# Patient Record
Sex: Female | Born: 1970
Health system: Southern US, Community
[De-identification: ages and names within clinical notes are randomized; demographics above are authoritative.]

## PROBLEM LIST (undated history)

## (undated) DIAGNOSIS — I10 Essential (primary) hypertension: Secondary | ICD-10-CM

## (undated) DIAGNOSIS — J45909 Unspecified asthma, uncomplicated: Secondary | ICD-10-CM

## (undated) DIAGNOSIS — L509 Urticaria, unspecified: Secondary | ICD-10-CM

## (undated) HISTORY — DX: Urticaria, unspecified: L50.9

## (undated) HISTORY — DX: Unspecified asthma, uncomplicated: J45.909

---

## 2002-02-01 ENCOUNTER — Other Ambulatory Visit: Admission: RE | Admit: 2002-02-01 | Discharge: 2002-02-01 | Payer: Self-pay | Admitting: Obstetrics and Gynecology

## 2002-02-17 ENCOUNTER — Ambulatory Visit (HOSPITAL_COMMUNITY): Admission: RE | Admit: 2002-02-17 | Discharge: 2002-02-17 | Payer: Self-pay | Admitting: Obstetrics and Gynecology

## 2006-06-19 ENCOUNTER — Emergency Department (HOSPITAL_COMMUNITY): Admission: EM | Admit: 2006-06-19 | Discharge: 2006-06-19 | Payer: Self-pay | Admitting: Family Medicine

## 2006-09-10 ENCOUNTER — Emergency Department (HOSPITAL_COMMUNITY): Admission: EM | Admit: 2006-09-10 | Discharge: 2006-09-10 | Payer: Self-pay | Admitting: Family Medicine

## 2007-05-09 ENCOUNTER — Ambulatory Visit: Payer: Self-pay | Admitting: Cardiology

## 2007-05-09 ENCOUNTER — Inpatient Hospital Stay (HOSPITAL_COMMUNITY): Admission: AD | Admit: 2007-05-09 | Discharge: 2007-05-18 | Payer: Self-pay | Admitting: *Deleted

## 2007-05-13 ENCOUNTER — Encounter (INDEPENDENT_AMBULATORY_CARE_PROVIDER_SITE_OTHER): Payer: Self-pay | Admitting: Obstetrics

## 2007-05-15 ENCOUNTER — Encounter: Payer: Self-pay | Admitting: General Surgery

## 2007-05-16 ENCOUNTER — Encounter (INDEPENDENT_AMBULATORY_CARE_PROVIDER_SITE_OTHER): Payer: Self-pay | Admitting: Obstetrics and Gynecology

## 2007-05-19 ENCOUNTER — Encounter (HOSPITAL_COMMUNITY): Admission: RE | Admit: 2007-05-19 | Discharge: 2007-06-18 | Payer: Self-pay | Admitting: Obstetrics and Gynecology

## 2007-06-19 ENCOUNTER — Encounter: Admission: RE | Admit: 2007-06-19 | Discharge: 2007-07-13 | Payer: Self-pay | Admitting: Obstetrics and Gynecology

## 2007-12-23 ENCOUNTER — Ambulatory Visit (HOSPITAL_COMMUNITY): Admission: RE | Admit: 2007-12-23 | Discharge: 2007-12-23 | Payer: Self-pay | Admitting: Obstetrics

## 2008-01-26 ENCOUNTER — Ambulatory Visit (HOSPITAL_COMMUNITY): Admission: RE | Admit: 2008-01-26 | Discharge: 2008-01-26 | Payer: Self-pay | Admitting: Obstetrics

## 2008-02-16 ENCOUNTER — Ambulatory Visit (HOSPITAL_COMMUNITY): Admission: RE | Admit: 2008-02-16 | Discharge: 2008-02-16 | Payer: Self-pay | Admitting: Obstetrics

## 2008-03-09 ENCOUNTER — Ambulatory Visit (HOSPITAL_COMMUNITY): Admission: RE | Admit: 2008-03-09 | Discharge: 2008-03-09 | Payer: Self-pay | Admitting: Obstetrics

## 2008-04-08 ENCOUNTER — Ambulatory Visit (HOSPITAL_COMMUNITY): Admission: RE | Admit: 2008-04-08 | Discharge: 2008-04-08 | Payer: Self-pay | Admitting: Obstetrics

## 2008-05-17 ENCOUNTER — Encounter: Admission: RE | Admit: 2008-05-17 | Discharge: 2008-05-17 | Payer: Self-pay | Admitting: Obstetrics

## 2008-06-09 ENCOUNTER — Inpatient Hospital Stay (HOSPITAL_COMMUNITY): Admission: AD | Admit: 2008-06-09 | Discharge: 2008-06-10 | Payer: Self-pay | Admitting: Obstetrics

## 2008-07-20 ENCOUNTER — Inpatient Hospital Stay (HOSPITAL_COMMUNITY): Admission: AD | Admit: 2008-07-20 | Discharge: 2008-07-23 | Payer: Self-pay | Admitting: Obstetrics & Gynecology

## 2008-07-24 ENCOUNTER — Encounter: Admission: RE | Admit: 2008-07-24 | Discharge: 2008-08-04 | Payer: Self-pay | Admitting: Obstetrics

## 2008-09-10 ENCOUNTER — Emergency Department (HOSPITAL_COMMUNITY): Admission: EM | Admit: 2008-09-10 | Discharge: 2008-09-10 | Payer: Self-pay | Admitting: Emergency Medicine

## 2008-11-30 IMAGING — US US OB FOLLOW-UP
1 series · 14 of 28 positions shown · non-contrast
Comparison: none

OBSTETRICAL ULTRASOUND:
 This ultrasound was performed in The [HOSPITAL], and the [facility] report will be stored to [REDACTED] PACS.

[Series 1: us ob follow-up · 14 of 68 slices shown]
[im 3/68]
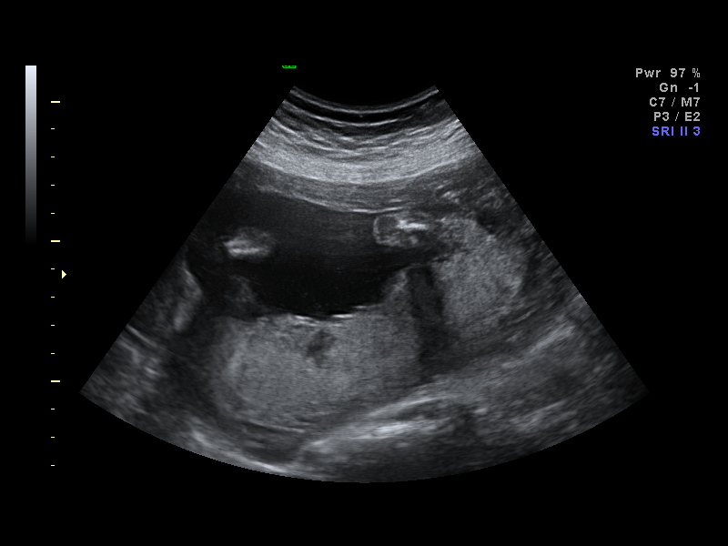
[im 8/68]
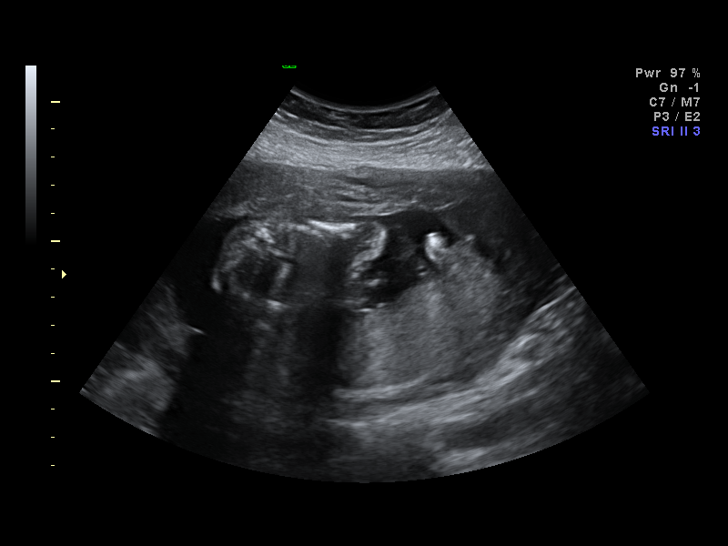
[im 13/68]
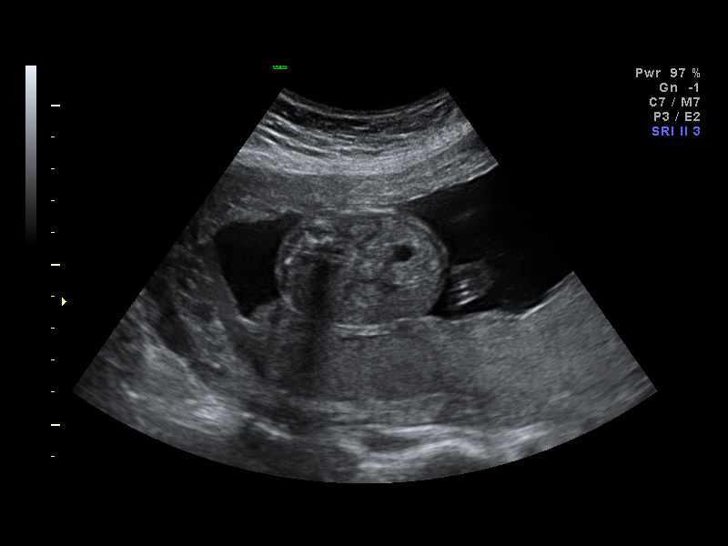
[im 18/68]
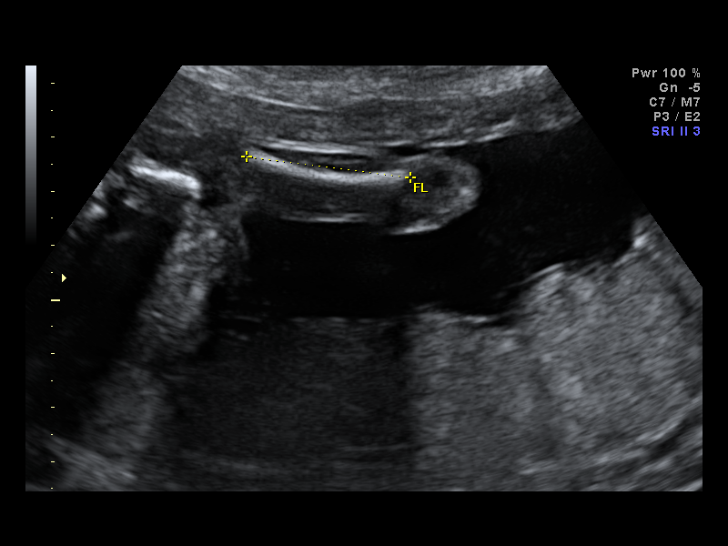
[im 23/68]
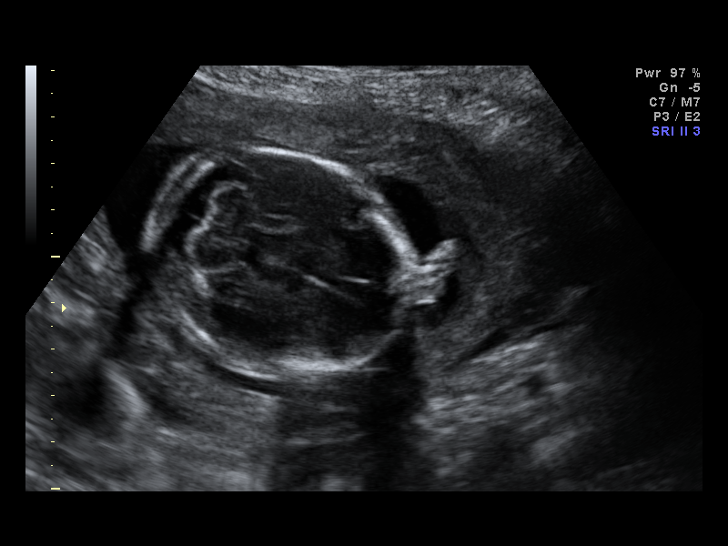
[im 28/68]
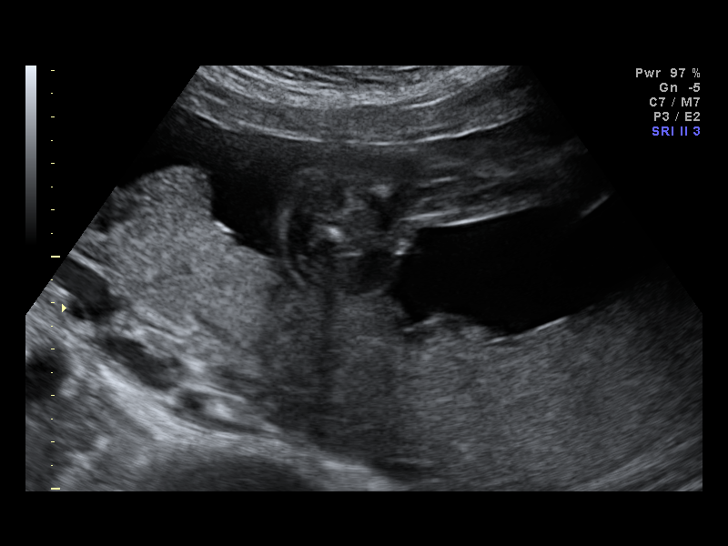
[im 33/68]
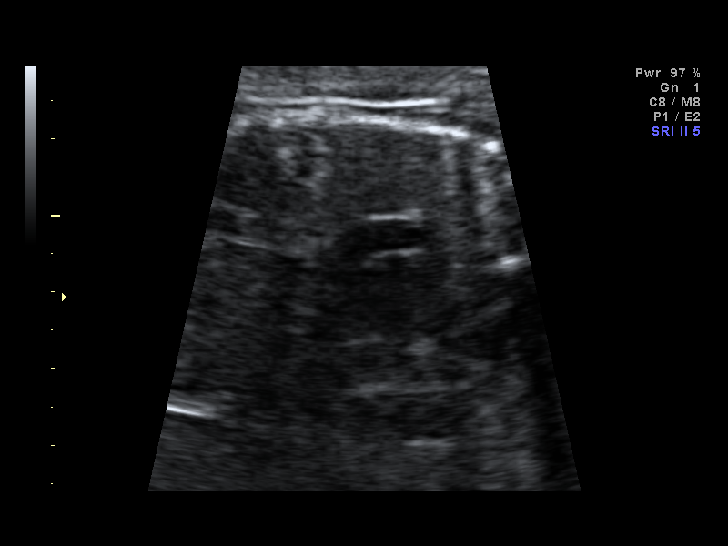
[im 38/68]
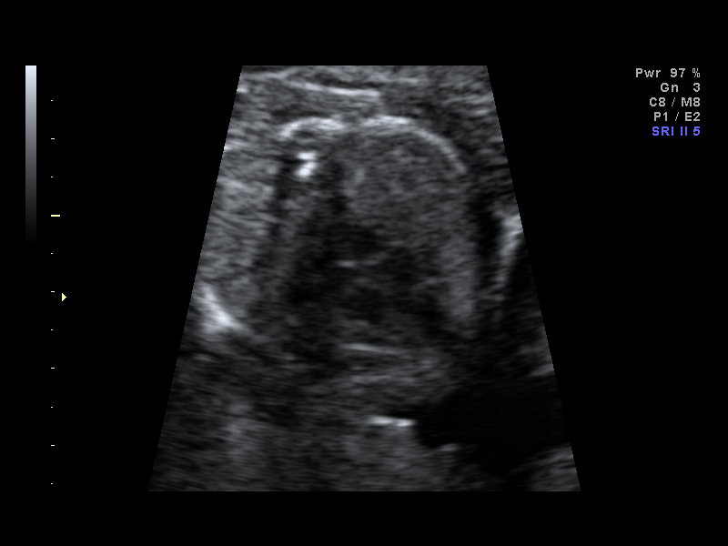
[im 43/68]
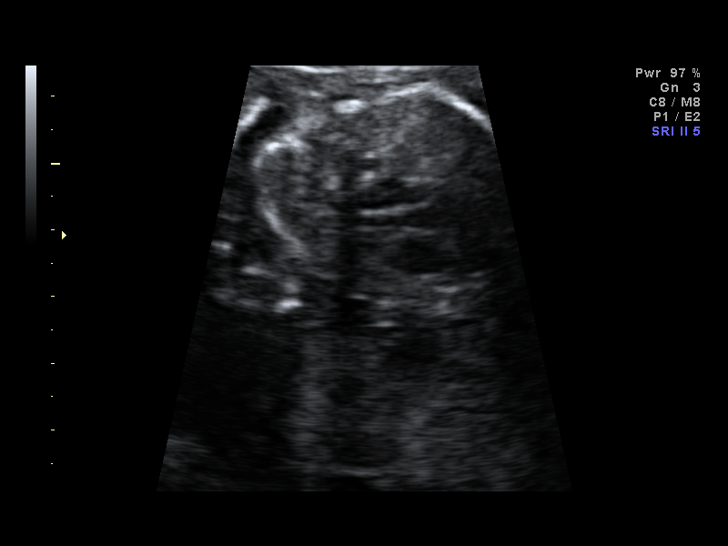
[im 48/68]
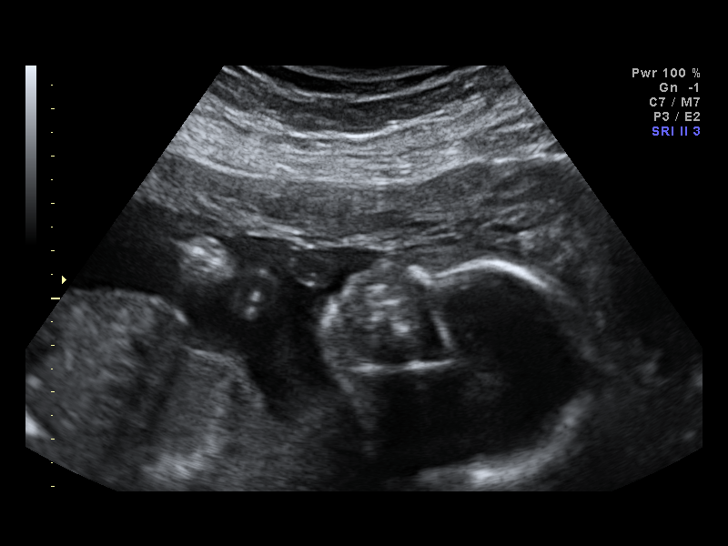
[im 53/68]
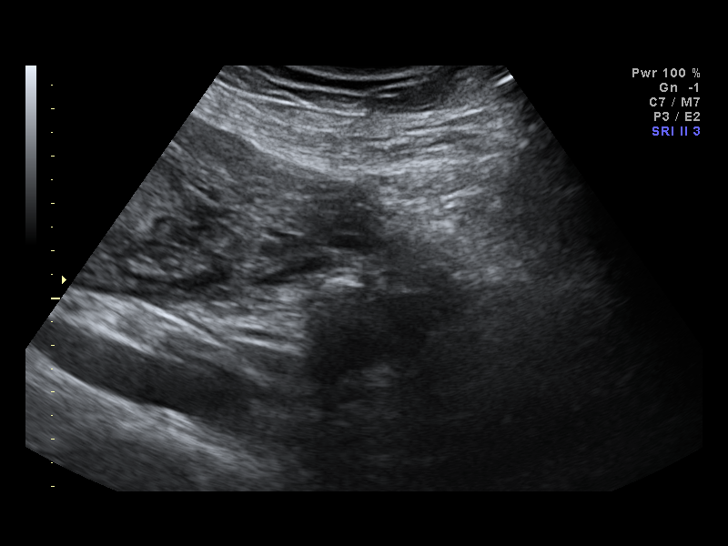
[im 58/68]
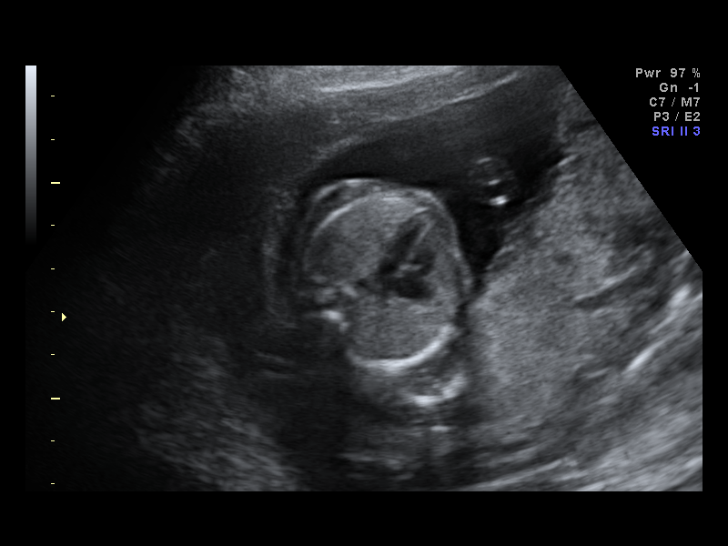
[im 63/68]
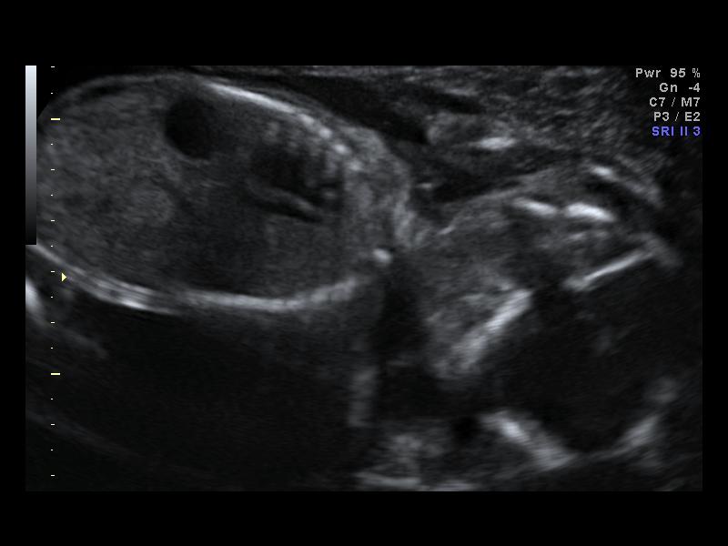
[im 68/68]
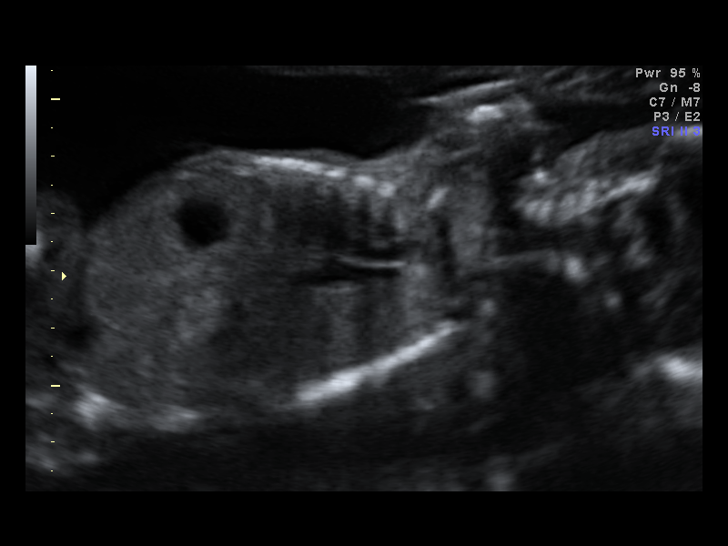

[14 of 28 positions shown; findings below may reference images not displayed]

IMPRESSION: [facility] has also been faxed to the ordering physician.

## 2009-09-07 ENCOUNTER — Encounter (INDEPENDENT_AMBULATORY_CARE_PROVIDER_SITE_OTHER): Payer: Self-pay | Admitting: Obstetrics

## 2009-09-07 ENCOUNTER — Inpatient Hospital Stay (HOSPITAL_COMMUNITY): Admission: RE | Admit: 2009-09-07 | Discharge: 2009-09-10 | Payer: Self-pay | Admitting: Obstetrics

## 2010-08-14 LAB — CBC
HCT: 27.2 % — ABNORMAL LOW (ref 36.0–46.0)
Hemoglobin: 9.3 g/dL — ABNORMAL LOW (ref 12.0–15.0)
MCHC: 33.8 g/dL (ref 30.0–36.0)
MCHC: 34.1 g/dL (ref 30.0–36.0)
Platelets: 139 10*3/uL — ABNORMAL LOW (ref 150–400)
Platelets: 139 10*3/uL — ABNORMAL LOW (ref 150–400)
RBC: 3.07 MIL/uL — ABNORMAL LOW (ref 3.87–5.11)
RDW: 17.1 % — ABNORMAL HIGH (ref 11.5–15.5)
RDW: 17.2 % — ABNORMAL HIGH (ref 11.5–15.5)
WBC: 13.3 10*3/uL — ABNORMAL HIGH (ref 4.0–10.5)
WBC: 14.3 10*3/uL — ABNORMAL HIGH (ref 4.0–10.5)

## 2010-08-15 LAB — COMPREHENSIVE METABOLIC PANEL
ALT: 12 U/L (ref 0–35)
AST: 15 U/L (ref 0–37)
Albumin: 2.5 g/dL — ABNORMAL LOW (ref 3.5–5.2)
BUN: 6 mg/dL (ref 6–23)
BUN: 7 mg/dL (ref 6–23)
CO2: 20 mEq/L (ref 19–32)
CO2: 21 mEq/L (ref 19–32)
Calcium: 8.1 mg/dL — ABNORMAL LOW (ref 8.4–10.5)
GFR calc Af Amer: >60 mL/min (ref >60)
GFR calc non Af Amer: >60 mL/min (ref >60)
Glucose, Bld: 106 mg/dL — ABNORMAL HIGH (ref 70–99)

## 2010-08-15 LAB — URINALYSIS, ROUTINE W REFLEX MICROSCOPIC
Bilirubin Urine: NEGATIVE
Glucose, UA: NEGATIVE mg/dL
Nitrite: NEGATIVE
Protein, ur: NEGATIVE mg/dL
Specific Gravity, Urine: 1.02 (ref 1.005–1.030)
pH: 6.5 (ref 5.0–8.0)

## 2010-08-15 LAB — URINE MICROSCOPIC-ADD ON

## 2010-08-15 LAB — CBC
Hemoglobin: 12.7 g/dL (ref 12.0–15.0)
MCHC: 33.8 g/dL (ref 30.0–36.0)
MCV: 88 fL (ref 78.0–100.0)
Platelets: 151 10*3/uL (ref 150–400)
Platelets: 209 10*3/uL (ref 150–400)
RBC: 4.25 MIL/uL (ref 3.87–5.11)
RDW: 16.8 % — ABNORMAL HIGH (ref 11.5–15.5)
WBC: 20.8 10*3/uL — ABNORMAL HIGH (ref 4.0–10.5)

## 2010-08-15 LAB — TYPE AND SCREEN: ABO/RH(D): B POS

## 2010-08-15 LAB — GLUCOSE, CAPILLARY: Glucose-Capillary: 97 mg/dL (ref 70–99)

## 2010-09-05 LAB — POCT URINALYSIS DIP (DEVICE)
Glucose, UA: 100 mg/dL — AB
Ketones, ur: 15 mg/dL — AB
Nitrite: POSITIVE — AB

## 2010-09-05 LAB — POCT PREGNANCY, URINE: Preg Test, Ur: NEGATIVE

## 2010-09-10 LAB — CBC
HCT: 35.9 % — ABNORMAL LOW (ref 36.0–46.0)
Hemoglobin: 12.2 g/dL (ref 12.0–15.0)
RBC: 4.19 MIL/uL (ref 3.87–5.11)
WBC: 10.4 10*3/uL (ref 4.0–10.5)

## 2010-09-10 LAB — PROTEIN, URINE, 24 HOUR
Collection Interval-UPROT: 24 hours
Protein, Urine: 3 mg/dL
Urine Total Volume-UPROT: 2875 mL

## 2010-09-10 LAB — COMPREHENSIVE METABOLIC PANEL
ALT: 11 U/L (ref 0–35)
Alkaline Phosphatase: 70 U/L (ref 39–117)
BUN: 5 mg/dL — ABNORMAL LOW (ref 6–23)
Chloride: 108 mEq/L (ref 96–112)
Glucose, Bld: 120 mg/dL — ABNORMAL HIGH (ref 70–99)
Potassium: 3.6 mEq/L (ref 3.5–5.1)
Sodium: 137 mEq/L (ref 135–145)
Total Bilirubin: 0.2 mg/dL — ABNORMAL LOW (ref 0.3–1.2)

## 2010-09-10 LAB — GLUCOSE, CAPILLARY
Glucose-Capillary: 128 mg/dL — ABNORMAL HIGH (ref 70–99)
Glucose-Capillary: 142 mg/dL — ABNORMAL HIGH (ref 70–99)
Glucose-Capillary: 167 mg/dL — ABNORMAL HIGH (ref 70–99)
Glucose-Capillary: 204 mg/dL — ABNORMAL HIGH (ref 70–99)

## 2010-09-10 LAB — CREATININE CLEARANCE, URINE, 24 HOUR
Creatinine, 24H Ur: 1259 mg/d (ref 700–1800)
Creatinine: 0.49 mg/dL (ref 0.40–1.20)
Urine Total Volume-CRCL: 2875 mL

## 2010-09-10 LAB — RPR: RPR Ser Ql: NONREACTIVE

## 2010-09-10 LAB — LACTATE DEHYDROGENASE: LDH: 121 U/L (ref 94–250)

## 2010-09-11 LAB — GLUCOSE, CAPILLARY
Glucose-Capillary: 133 mg/dL — ABNORMAL HIGH (ref 70–99)
Glucose-Capillary: 137 mg/dL — ABNORMAL HIGH (ref 70–99)
Glucose-Capillary: 67 mg/dL — ABNORMAL LOW (ref 70–99)
Glucose-Capillary: 82 mg/dL (ref 70–99)
Glucose-Capillary: 85 mg/dL (ref 70–99)

## 2010-09-11 LAB — RPR: RPR Ser Ql: NONREACTIVE

## 2010-09-11 LAB — CBC
Hemoglobin: 11.3 g/dL — ABNORMAL LOW (ref 12.0–15.0)
MCHC: 33 g/dL (ref 30.0–36.0)
MCV: 88.4 fL (ref 78.0–100.0)
Platelets: 209 10*3/uL (ref 150–400)
RBC: 3.76 MIL/uL — ABNORMAL LOW (ref 3.87–5.11)
RBC: 4.51 MIL/uL (ref 3.87–5.11)
WBC: 15.4 10*3/uL — ABNORMAL HIGH (ref 4.0–10.5)

## 2010-10-09 NOTE — Consult Note (Signed)
Cindy [name], Cindy [name]                 ACCOUNT NO.:  [id number]   MEDICAL RECORD NO.:  [id number]          PATIENT TYPE:  INP   LOCATION:  9154                          FACILITY:  [facility]   PHYSICIAN:  Angelia Mould. Derrell Lolling, M.D.DATE OF BIRTH:  04-12-1971   DATE OF CONSULTATION:  05/12/2007  DATE OF DISCHARGE:                                 CONSULTATION   REASON FOR CONSULTATION:  1. Gallstones.  2. Abdominal pain.  3. Preeclampsia.   PRESENT ILLNESS:  This is a 40 year old female who has her first  pregnancy.  By dates and exam, she is at about 28 weeks.  She has  chronic hypertension.  She was admitted on May 09, 2007 with  epigastric pain and superimposed preeclampsia.  She states that she has  had the epigastric pain and possibly right upper quadrant pain and flank  pain since Sunday, December 13 but has no prior episodes.  She has no  history of hepatitis or biliary tract disease.   Today, she states the pain has resolved but she is nauseated and has  vomited today.   An ultrasound was performed today, and it shows two tiny gallstones, but  otherwise there is no evidence of any inflammatory change around the  gallbladder and the common bile duct is not dilated.   Her liver function test during her hospitalization have basically  reflected a normal total bilirubin and a normal alkaline phosphatase  throughout.  However, her SGOT and SGPT have fluctuated in the 50-60  range, and today her SGOT is 195 and SGPT is 165.  Her amylase and  lipase are normal.  Her hemoglobin is 13.2, white blood cell count  17,900.   PAST HISTORY:  Chronic hypertension.  No other medical problems.  No  other surgical problems.   CURRENT MEDICATIONS:  Prenatal vitamins, Aldomet.   DRUG ALLERGIES:  Doxycycline.   SOCIAL HISTORY:  She is married.  She is a Designer, jewellery at [employer].   FAMILY HISTORY:  Hypertension.  Otherwise noncontributory.   REVIEW OF SYSTEMS:  All systems  reviewed.  They are noncontributory  except as described above.   PHYSICAL EXAMINATION:  GENERAL:  Somewhat sleepy but arousable and  cooperative female who does not appear to be in any distress but became  nauseated and vomited once while I was in the room.  States that she  otherwise feels okay.  VITAL SIGNS:  Temperature is 98.5, and she has been afebrile.  Respiratory 22 and unlabored, heart rate 68 and regular, blood pressure  163/96.  EYES:  Sclerae clear.  Extraocular movements intact.  NECK:  Supple, no mass.  No jugular distention.  LUNGS:  Clear to auscultation.  No chest wall tenderness.  HEART:  Regular rate and rhythm, no murmur.  ABDOMEN:  Obviously gravid with the fundus of the uterus well above the  umbilicus.  Bowel sounds are present.  They are hypoactive.  The abdomen  away from the uterus is basically soft.  Particular attention is paid to  the epigastrium of the right upper quadrant, and they are  completely  soft with no tenderness.  No mass and no guarding.  EXTREMITIES:  She  moves all four extremities well without pain or deformity.   Data ordered reviewed.  Lab work and ultrasound as described above.   ASSESSMENT:  1. A 28-week intrauterine pregnancy.  2. Abdominal pain and gallstones and elevated liver tests.  It is      unclear whether her clinical findings are due to preeclampsia and      the HELLP syndrome or to low grade cholecystitis.  I doubt that she      has common bile duct stones, and I doubt that she has cholangitis.  3. There is no indication for emergent or urgent cholecystectomy at      this time.   PLAN:  1. The patient should be observed with bowel rest, n.p.o., IV fluid      hydration, intravenous antibiotics and proton pump inhibitors.  2. We will repeat her lab work tomorrow.  3. We could consider repeating her gallbladder ultrasound if she      developed some right upper quadrant tenderness again.  Otherwise, I      think she should  be treated expectantly.  4. Perhaps she should be delivered soon.   Will follow with you.      Angelia Mould. Derrell Lolling, M.D.  Electronically Signed     HMI/MEDQ  D:  05/12/2007  T:  05/13/2007  Job:  [id number]   cc:   Maxie Better, M.D.  Fax: [phone]   Chester Holstein Earlene Plater, M.D.  Fax: [phone]

## 2010-10-09 NOTE — Op Note (Signed)
[name], [name]                 ACCOUNT NO.:  [id number]   MEDICAL RECORD NO.:  [id number]          PATIENT TYPE:  INP   LOCATION:  9199                          FACILITY:  [facility]   PHYSICIAN:  Genia Del, M.D.DATE OF BIRTH:  1971-02-20   DATE OF PROCEDURE:  DATE OF DISCHARGE:                               OPERATIVE REPORT   PREOPERATIVE DIAGNOSES:  A 38-plus weeks' gestation, spontaneous rupture  of membranes in early labor, chronic hypertension, gestational diabetes  mellitus A2, previous cesarean section.   POSTOPERATIVE DIAGNOSES:  A 38-plus weeks' gestation, spontaneous  rupture of membranes in early labor, chronic hypertension, gestational  diabetes mellitus A2, previous cesarean section.   PROCEDURE:  Urgent repeat low-transverse cesarean section.   SURGEON:  Genia Del, MD   ANESTHESIOLOGIST:  Tyrone Apple. Malen Gauze, MD   PROCEDURE:  Under spinal anesthesia, the patient was in 15-degree left  decubitus position.  She was prepped with Betadine on the abdominal,  suprapubic, and the vulvar areas.  The bladder catheter was put in place  and the patient was draped as usual.  We verified the level of  anesthesia, which was adequate.  We infiltrated the subcutaneous tissues  with Marcaine 0.25% plain 10 mL at the site of the previous C-section.  We made a Pfannenstiel incision at that side with a scalpel, opened the  adipose tissue with the electrocautery, opened the aponeurosis  transversely with the electrocautery, and finished with Mayo scissors on  either side.  We then separated the recti muscles from the aponeurosis  on the midline superiorly and inferiorly.  We opened the parietal  peritoneum longitudinally with [facility] scissors.  We then put in place the  bladder retractor.  We opened the visceral peritoneum transversely over  the lower uterine segment.  With [employer], we reclined the bladder  downward.  We then made a low transverse hysterotomy with the  scalpel,  extension on each side with dressing scissors.  The fetus was in  cephalic presentation.  Amniotic fluid was clear.  Birth of a baby girl  at 3:09 a.m.  The baby was suctioned.  The cord was clamped and cut.  The baby was given to the neonatal team.  Apgars were 9 and 9.  The  placenta was evacuated manually.  It was complete with three vessels.  It was sent to labor and delivery.  Uterine revision done.  We then  started Pitocin and the IV fluid.  A dose of Ancef 1 g IV was given.  We  closed the hysterotomy with a first full-thickness locked suture of  Vicryl 0.  We made a second layer in a mattress stitch with Vicryl 0.  We verified hemostasis, which was adequate at all levels.  The uterus  was well contracted.  Both tubes and both ovaries were normal to  inspection.  We verified hemostasis after putting the uterus back in the  abdominopelvic cavity.  We irrigated and suctioned.  We verified  hemostasis on the recti muscles.  We completed where necessary with the  electrocautery.  We then closed the aponeurosis with  two half running  sutures of Vicryl 0, completed hemostasis on the adipose tissue with the  electrocautery and closed the skin with staples.  Compressive dry  dressing was applied.  The count of instruments and sponges was complete  x2.  The estimated blood loss was 700 mL.  No complications occurred,  and the patient was brought to recovery room in good stable status.      Genia Del, M.D.  Electronically Signed     ML/MEDQ  D:  07/20/2008  T:  07/20/2008  Job:  [id number]

## 2010-10-09 NOTE — Op Note (Signed)
[name], [name]                 ACCOUNT NO.:  [id number]   MEDICAL RECORD NO.:  [id number]          PATIENT TYPE:  INP   LOCATION:  9162                          [facility]ACILITY:  [facility]   PHYSICIAN:  Lendon Colonel, MD   DATE OF BIRTH:  July 01, 1970   DATE OF PROCEDURE:  05/13/2007  DATE OF DISCHARGE:                               OPERATIVE REPORT   PREOPERATIVE DIAGNOSIS:  Hemolysis, elevated liver enzyme levels and a  low platelet count, severe pre-eclampsia, fetal nontolerance to labor.   POSTOPERATIVE DIAGNOSIS:  Hemolysis, elevated liver enzyme levels and a  low platelet count, severe pre-eclampsia, fetal nontolerance to labor.   OPERATION PERFORMED:  Primary low transverse cesarean section.   SURGEON:  Lendon Colonel, MD   ASSISTANT:  Chester Holstein. Earlene Plater, M.D.   ANESTHESIA:  Spinal.   SPECIMENS:  Placenta to pathology.   ESTIMATED BLOOD LOSS:  500 mL.   COMPLICATIONS:  None.   FINDINGS:  Female fetus in the LOT position, Apgars 8 and 8, cord pH  7.33, normal tubes, normal ovaries palpable.  Two subserosal fibroids  approximately 4 cm each.  Normal-appearing placenta, clear fluid.   INDICATIONS FOR PROCEDURE:  This is a 40 year old G1 at 50 weeks who  presented with pre-eclampsia, has progressed to severe pre-eclampsia  with evidence of HELLP syndrome, thrombocytopenia and elevated liver  enzymes.  After a short trial of labor the baby exhibited fetal  nontolerance to labor and decision was made to proceed with primary  cesarean section.   DESCRIPTION OF PROCEDURE:  The patient was taken to the operating room.  After spinal anesthesia was initiated without difficulty, she was  prepped and draped in the normal sterile fashion in dorsal supine  position.  A Foley catheter was inserted into the bladder.  A  Pfannenstiel skin incision was made 2 cm above the pubic symphysis in  the midline with the scalpel, carried through to the underlying layer of  fascia with the Bovie  cautery.  The fascia was incised in the midline  and the incision extended laterally.  The rectus muscles were separated  bluntly.  The peritoneum was picked up and entered sharply.  The  peritoneal incision extended superiorly and inferiorly with good  visualization of the bladder.  The bladder blade was inserted and the  vesicouterine peritoneum identified, grasped with the pick ups and the  bladder flap was created sharply.  Palpation of the patient's uterus was  done to determine the uterine incision and decision was made to proceed  with a low transverse cesarean section.  Of note, two fibroids, both  approximately 3 cm subserosal were noted, one in the left fundus and one  on the anterior portion of the uterus.  The uterine incision was made  with the scalpel.  Once the membranes were apparent, the uterine  incision was extended with the bandage scissors, the amniotic sac was  then ruptured.  The fetal occiput was then grasped and with pressure,  easy delivery of a female infant was accomplished.  Cord was clamped and  cut and the infant  was handed off to the waiting pediatrician.  Cord pH  was obtained and sent.  Placenta was expressed.  Initial attempt to  exteriorize the uterus was unsuccessful due to the small fascial  incision.  Decision was made to leave the uterus in situ.  The uterus  was cleared of all clots and debris.  The bladder blade was reinserted.  The edges of the uterine incision were tagged and the uterine incision  was closed with 0 Vicryl in a running locked fashion.  A second layer of  suture was used in an imbricating fashion.  The gutters were cleared of  all clots and debris.  The uterine incision was reinspected and found to  be hemostatic.  Some ooze was noted from the anterior surface of the  fibroid.  This was controlled with cautery and pressure.  Once  hemostatic, the tagged incision edges were cut.  The cut muscle edges  and underside of the fascia  were inspected and found to be hemostatic.  The fascia was closed with 0 Vicryl in a running fashion in one layer.  Subcutaneous tissue was irrigated.  No bleeders were noted.  A 2-0 plain  was used to close the subcutaneous tissue and the skin and the skin was  closed with staples.  The patient tolerated the procedure well.  Sponge,  lap, and needle counts were correct x3.  The patient was then taken to  the recovery room in stable condition.      Lendon Colonel, MD  Electronically Signed     KAF/MEDQ  D:  05/13/2007  T:  05/13/2007  Job:  [id number]

## 2010-10-09 NOTE — Discharge Summary (Signed)
Cindy [name], Cindy [name]                 ACCOUNT NO.:  [id number]   MEDICAL RECORD NO.:  [id number]          PATIENT TYPE:  INP   LOCATION:  9318                          [facility]   PHYSICIAN:  Lendon Colonel, MD   DATE OF BIRTH:  09-09-1970   DATE OF ADMISSION:  05/09/2007  DATE OF DISCHARGE:  05/18/2007                               DISCHARGE SUMMARY   CHIEF COMPLAINT:  Elevated blood pressure and epigastric pain.   HISTORY OF PRESENT ILLNESS:  This is a 40 year old G1 admitted at 27-5/7  gestation for elevated blood pressure and epigastric pain.  The patient  had no contractions, no vaginal bleeding, good fetal movement and no  leakage of fluid.   The patient's prenatal care is significant for onset of obstetric care  at eight weeks with [facility].  This was her first baby.  She was  on prenatal vitamins and Aldomet for a history of chronic hypertension  diagnosed in March, 2008.   She was allergic to DOXYCYCLINE.   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   Her prenatal labs were unremarkable.  At the time of the admission, the  patient had not yet done her one-hour Glucola.   Patient had followed for her chronic hypertension with her last 24-hour  urine showing 207 mg of urine and normal PIH labs with an SGOT of 18.   PHYSICAL EXAMINATION:  On admission, her blood pressure was 177/109.  HEART:  Regular.  LUNGS:  Clear.  LOWER EXTREMITIES:  No edema.  ABDOMEN:  Gravid.  PELVIC:  Cervical exam showed her to be long and closed with the fetus  not in the pelvis.  She had fetal heart rate tracing with a baseline in  the 140s with good variability with some variable decelerations.   Her labs at the time of admission showed her hematocrit to be 40.  UA  showed 30 protein on a dipstick, uric acid of 4.2, and AST of 52,  platelets 162, and LDH of 243.   She was diagnosed with chronic hypertension with superimposed  preeclampsia.   She was admitted for mag sulfate until her blood pressure  was controlled, to be followed by the NICU to obtain an ultrasound for  estimated fetal weight and to have her PIH labs followed.   On hospital day #2, she was receiving her steroids.  Her diabetic screen  was deferred.  She was noting improved epigastric pain but some  headache, although no visual changes.  Her blood pressure was 131-137/84-  88.  She was afebrile.  An ultrasound showed the baby to be 895 gm,  which represented the 20 percentile.  Her SGOT was 59, and her platelets  were 160.  Ultrasound revealed the baby to be in the breech position.  She was continued on her mag sulfate and continued on her 24-hour urine  collection.  The plan at that time was for continued close observation  for delivery for worsening preeclampsia.  Patient had a consult with the  neonatologist.   On  hospital day #3, her blood pressures were in the 140s-160s/70s-90s.  On December 16, patient had worsening in her epigastric pain.  Her exam  showed her to have moderate discomfort in the right upper quadrant.  General surgery consult was obtained, given her elevated LFTs and her  epigastric pain.  She had a right upper quadrant ultrasound which showed  gallstones but no cholecystitis.  She was given IV analgesia for her  pain.  Her blood pressure remained 139-150/83-99.  Her blood cell count  had elevated to 16.2, likely secondary to the steroids.  Repeat LFTs on  December 16 revealed her SGOT to have risen to 195.  Her SGPT risen to  105.  Her amylase and lipase were within normal limits.   After review of the patient, general surgery did not feel that this was  consistent with cholecystitis, although choledocholithiasis was still in  the differential.  By 10:30 p.m. on December 16, the patient's clinical  picture continued to deteriorate.  Her blood pressures were in the 150s  to 170s/high 90s.  Patient did have some relief of her right  upper  quadrant.  Had no vision changes and no scotomata.  However, her LFTs  remained elevated.  Patient began exhibiting new-onset thrombocytopenia.  Her platelets from admission changed from 313 to 125 to 90.  By the  evening of December 16, her 24-hour urine protein revealed 443 mg of  protein.  Real time ultrasound revealed the baby to be in a vertex  position with an anterior fundal placenta and grossly normal fluid.  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  Despite her  early gestational age, given that she had severe preeclampsia with new-  onset HELP syndrome and was steroid-complete, the plan was to proceed  with delivery. Given that the patient was stable, the plan was for  attempt at  induction of labor.  Also, plan for IV dexamethasone to help  bring up her platelets level in hopes of her getting an epidural.  After  a very short time of labor induction, the fetal baseline changed from  the 140s to the 160s and began to exhibit late decelerations.  Given the  remoteness from delivery, the plan was for primary cesarean section with  post-partum mag and dexamethasone.   The patient underwent a primary low transverse cesarean section.  That  note can be found in a separate dictation.   On postop day #1, the patient's epigastric pain had resolved, and she  was appropriate for a postop state.  She continued on IV dexamethasone.  Her platelets were 102, and her LFTs were returning to normal.   The patient made initial improvements during her first 36 hours post  partum while she was receiving IV dexamethasone q.12h.  After that time,  her LFTs began to increase, and her platelets began to decrease again.  Patient had repeat episode of her right upper quadrant pain.  Surgical  consult was again called.  Patient had a CAT scan and a  HIDA scan, which  were both negative.   Her platelets came down to 31.  Her AST rose to 529.  Her ALT to 577.  She had a T bili of 1.8 and a glucose of 92.  At that time, the decision  was made to restart post partum dexamethasone to help  recovery of her  platelet levels.  At this time, the patient also went into asymptomatic  trigeminy.  Patient was kept on telemetry, and EKG was obtained and  cardiology consult was obtained with plan for echocardiogram.  Patient  was continued on her Aldomet for her chronic hypertension.   Patient received several additional doses of IV dexamethasone with  eventual improved trend in both her platelets and her LFTs.  Her glucose  always remained normal.  The patient had a normal 2D echo.  She resolved  her trigeminy and had no symptoms from her cardiac dysrhythmia.   By postop day #5, her SGOT was 27.  Her ALT was 197.  Her platelets were  194.  Patient had no signs or symptoms of preeclampsia.  Blood pressure  remained in the 130s-150s/80s-90s.  The decision was made to discharge  the patient home with continued Aldomet.  She was given Tylox for her  incisional pain.   DISCHARGE DIAGNOSIS:  Severe preeclampsia with HELLP (hemolytic anemia,  elevated liver enzymes, low platelet count) syndrome.   DISCHARGE CONDITION:  Stable.   DISCHARGE DISPOSITION:  Home.      Lendon Colonel, MD  Electronically Signed     KAF/MEDQ  D:  07/04/2007  T:  07/06/2007  Job:  [id number]

## 2010-10-12 NOTE — Op Note (Signed)
   NAME:  Cindy [name], KALMAN NO.:  [id number]   MEDICAL RECORD NO.:  [id number]                   PATIENT TYPE:  AMB   LOCATION:  SDC                                  FACILITY:  [facility]   PHYSICIAN:  Sherry A. Rosalio Macadamia, M.D.           DATE OF BIRTH:  [birth year]   DATE OF PROCEDURE:  02/17/2002  DATE OF DISCHARGE:                                 OPERATIVE REPORT   PREOPERATIVE DIAGNOSIS:  Tight hymenal band.   POSTOPERATIVE DIAGNOSIS:  Tight hymenal band.   PROCEDURE:  Hymenotomy.   SURGEON:  Sherry A. Rosalio Macadamia, M.D.   ANESTHESIA:  General.   INDICATIONS:  This is a 40 year old G0, P0 woman who has been having painful  intercourse.  She has never been successfully able to have intercourse.  She  is currently planning on being married and requests surgical intervention.   FINDINGS:  Tight hymenal band.  Normal vaginal exam.   DESCRIPTION OF PROCEDURE:  The patient was brought into the operating room  and given adequate general anesthesia.  She was prepped in the dorsal  lithotomy position.  Her perineum and vagina were washed with Betadine.  The  hymenal band was doubly clamped with hemostats at approximately 5 o'clock.  This cut was sutured with 3-0 chromic LigaSure.  This was performed in  several different places including 5 o'clock x2, 7 o'clock x2 and 2 o'clock.  A small amount of bleeding was found at 7 o'clock.  This was stopped with  suture.  The incisional areas were then infiltrated with 0.25% Marcaine.  A  small amount of bleeding was present.  This was stopped with Monsel's  solution.  Estrace cream was then placed around the hymenal opening.  Adequate hemostasis was then felt to be present.  The patient was taken out  of the dorsal lithotomy position.  She was awakened.  She was removed from  the operating table to a stretcher in stable condition.  Complications were  none.  Estimated blood loss was less than 5 cc.                                Sherry A. Rosalio Macadamia, M.D.    SAD/MEDQ  D:  02/17/2002  T:  02/17/2002  Job:  [id number]

## 2011-03-01 LAB — CBC
HCT: 31 — ABNORMAL LOW
HCT: 31 — ABNORMAL LOW
HCT: 31.2 — ABNORMAL LOW
HCT: 31.5 — ABNORMAL LOW
HCT: 32 — ABNORMAL LOW
HCT: 32.4 — ABNORMAL LOW
HCT: 38.8
HCT: 39.6
Hemoglobin: 10.7 — ABNORMAL LOW
Hemoglobin: 10.8 — ABNORMAL LOW
Hemoglobin: 10.9 — ABNORMAL LOW
Hemoglobin: 10.9 — ABNORMAL LOW
Hemoglobin: 11.3 — ABNORMAL LOW
Hemoglobin: 12.3
Hemoglobin: 13.1
Hemoglobin: 13.3
Hemoglobin: 13.6
MCHC: 34
MCHC: 34.1
MCHC: 34.7
MCHC: 34.7
MCHC: 34.8
MCHC: 35.2
MCHC: 35.2
MCV: 86.2
MCV: 86.5
MCV: 86.9
MCV: 86.9
MCV: 86.9
MCV: 87.2
MCV: 87.3
MCV: 87.4
MCV: 87.9
Platelets: 102 — ABNORMAL LOW
Platelets: 122 — ABNORMAL LOW
Platelets: 178
Platelets: 31 — CL
Platelets: 75 — ABNORMAL LOW
Platelets: 82 — ABNORMAL LOW
Platelets: 89 — ABNORMAL LOW
Platelets: 90 — ABNORMAL LOW
RBC: 3.58 — ABNORMAL LOW
RBC: 3.59 — ABNORMAL LOW
RBC: 3.59 — ABNORMAL LOW
RBC: 3.6 — ABNORMAL LOW
RBC: 3.66 — ABNORMAL LOW
RBC: 3.74 — ABNORMAL LOW
RBC: 4.24
RBC: 4.44
RBC: 4.57
RDW: 13.8
RDW: 14
RDW: 14
RDW: 14.1
RDW: 14.1
RDW: 14.2
RDW: 14.3
RDW: 14.6
RDW: 14.7
RDW: 14.8
WBC: 11.9 — ABNORMAL HIGH
WBC: 15 — ABNORMAL HIGH
WBC: 17.9 — ABNORMAL HIGH
WBC: 20.5 — ABNORMAL HIGH
WBC: 20.6 — ABNORMAL HIGH
WBC: 25.2 — ABNORMAL HIGH
WBC: 26.1 — ABNORMAL HIGH
WBC: 26.8 — ABNORMAL HIGH
WBC: 27.3 — ABNORMAL HIGH

## 2011-03-01 LAB — COMPREHENSIVE METABOLIC PANEL
ALT: 113 — ABNORMAL HIGH
ALT: 197 — ABNORMAL HIGH
ALT: 259 — ABNORMAL HIGH
ALT: 44 — ABNORMAL HIGH
ALT: 55 — ABNORMAL HIGH
ALT: 577 — ABNORMAL HIGH
ALT: 84 — ABNORMAL HIGH
AST: 24
AST: 26
AST: 28
AST: 45 — ABNORMAL HIGH
AST: 529 — ABNORMAL HIGH
AST: 56 — ABNORMAL HIGH
Albumin: 2.2 — ABNORMAL LOW
Albumin: 2.3 — ABNORMAL LOW
Albumin: 2.4 — ABNORMAL LOW
Albumin: 2.6 — ABNORMAL LOW
Albumin: 2.7 — ABNORMAL LOW
Alkaline Phosphatase: 70
Alkaline Phosphatase: 72
Alkaline Phosphatase: 79
Alkaline Phosphatase: 81
Alkaline Phosphatase: 89
Alkaline Phosphatase: 93
BUN: 6
BUN: 7
BUN: 9
BUN: <1 — ABNORMAL LOW
CO2: 21
CO2: 22
CO2: 25
CO2: 25
CO2: 26
CO2: 30
CO2: 31
Calcium: 7.1 — ABNORMAL LOW
Calcium: 7.9 — ABNORMAL LOW
Calcium: 8.2 — ABNORMAL LOW
Calcium: 8.2 — ABNORMAL LOW
Calcium: 8.4
Chloride: 103
Chloride: 103
Chloride: 105
Chloride: 106
Chloride: 107
Chloride: 107
Creatinine, Ser: 0.54
Creatinine, Ser: 0.55
Creatinine, Ser: 0.55
Creatinine, Ser: 0.55
GFR calc Af Amer: >60
GFR calc Af Amer: >60
GFR calc Af Amer: >60
GFR calc Af Amer: >60
GFR calc non Af Amer: >60
GFR calc non Af Amer: >60
GFR calc non Af Amer: >60
GFR calc non Af Amer: >60
GFR calc non Af Amer: >60
Glucose, Bld: 117 — ABNORMAL HIGH
Glucose, Bld: 128 — ABNORMAL HIGH
Glucose, Bld: 133 — ABNORMAL HIGH
Glucose, Bld: 133 — ABNORMAL HIGH
Glucose, Bld: 136 — ABNORMAL HIGH
Glucose, Bld: 88
Glucose, Bld: 92
Potassium: 3.6
Potassium: 3.7
Potassium: 3.8
Potassium: 3.9
Potassium: 4
Potassium: 4.3
Sodium: 132 — ABNORMAL LOW
Sodium: 134 — ABNORMAL LOW
Sodium: 136
Sodium: 138
Sodium: 139
Sodium: 139
Total Bilirubin: 0.4
Total Bilirubin: 0.4
Total Bilirubin: 0.5
Total Bilirubin: 0.6
Total Bilirubin: 0.7
Total Bilirubin: 0.8
Total Bilirubin: 1.2
Total Protein: 4.8 — ABNORMAL LOW
Total Protein: 5.3 — ABNORMAL LOW
Total Protein: 5.4 — ABNORMAL LOW
Total Protein: 5.8 — ABNORMAL LOW

## 2011-03-01 LAB — TYPE AND SCREEN
ABO/RH(D): B POS
Antibody Screen: NEGATIVE

## 2011-03-01 LAB — HEPATIC FUNCTION PANEL
AST: 114 — ABNORMAL HIGH
Alkaline Phosphatase: 72
Alkaline Phosphatase: 73
Bilirubin, Direct: 0.2
Bilirubin, Direct: 0.2
Indirect Bilirubin: 0.7
Indirect Bilirubin: 0.8
Indirect Bilirubin: 1.1 — ABNORMAL HIGH
Total Bilirubin: 0.8
Total Bilirubin: 1
Total Bilirubin: 1.3 — ABNORMAL HIGH
Total Protein: 5.9 — ABNORMAL LOW

## 2011-03-01 LAB — URINALYSIS, DIPSTICK ONLY
Protein, ur: 100 — AB
Urobilinogen, UA: 0.2

## 2011-03-01 LAB — CREATININE, SERUM
Creatinine, Ser: 0.51
GFR calc Af Amer: >60
GFR calc Af Amer: >60
GFR calc non Af Amer: >60

## 2011-03-01 LAB — LACTATE DEHYDROGENASE
LDH: 162
LDH: 250
LDH: 677 — ABNORMAL HIGH

## 2011-03-01 LAB — PROTEIN, URINE, 24 HOUR
Collection Interval-UPROT: 24
Protein, 24H Urine: 443 — ABNORMAL HIGH
Protein, Urine: 15
Urine Total Volume-UPROT: 2950

## 2011-03-01 LAB — PROTIME-INR: INR: 1

## 2011-03-01 LAB — URIC ACID
Uric Acid, Serum: 4.6
Uric Acid, Serum: 4.7
Uric Acid, Serum: 4.8

## 2011-03-01 LAB — CREATININE, URINE, 24 HOUR
Creatinine, 24H Ur: 1021
Creatinine, Urine: 34.6

## 2011-03-01 LAB — DIFFERENTIAL
Basophils Absolute: 0
Basophils Relative: 0
Basophils Relative: 0
Eosinophils Relative: 0
Lymphs Abs: 2.5
Monocytes Absolute: 0.3
Monocytes Absolute: 1.3 — ABNORMAL HIGH
Neutro Abs: 14.1 — ABNORMAL HIGH
Neutro Abs: 9.8 — ABNORMAL HIGH

## 2011-03-01 LAB — FIBRINOGEN: Fibrinogen: 425

## 2011-03-01 LAB — ABO/RH: ABO/RH(D): B POS

## 2011-03-01 LAB — AMYLASE: Amylase: 62

## 2011-03-01 LAB — MAGNESIUM
Magnesium: 5.7 — ABNORMAL HIGH
Magnesium: 5.9 — ABNORMAL HIGH

## 2011-03-01 LAB — LIPASE, BLOOD
Lipase: 31
Lipase: 37

## 2011-03-04 LAB — COMPREHENSIVE METABOLIC PANEL
ALT: 61 — ABNORMAL HIGH
ALT: 66 — ABNORMAL HIGH
AST: 52 — ABNORMAL HIGH
AST: 59 — ABNORMAL HIGH
Albumin: 2.8 — ABNORMAL LOW
Albumin: 2.8 — ABNORMAL LOW
Alkaline Phosphatase: 79
BUN: 5 — ABNORMAL LOW
CO2: 21
Chloride: 103
Chloride: 105
Chloride: 105
Creatinine, Ser: 0.43
Creatinine, Ser: 0.52
Creatinine, Ser: 0.64
GFR calc Af Amer: >60
GFR calc Af Amer: >60
GFR calc non Af Amer: >60
GFR calc non Af Amer: >60
Glucose, Bld: 107 — ABNORMAL HIGH
Potassium: 3.9
Sodium: 132 — ABNORMAL LOW
Sodium: 134 — ABNORMAL LOW
Total Bilirubin: 0.5
Total Bilirubin: 0.6
Total Bilirubin: 0.7

## 2011-03-04 LAB — CBC
HCT: 38.9
Hemoglobin: 13.8
MCHC: 34.1
MCHC: 34.6
MCV: 86.6
MCV: 86.7
Platelets: 154
Platelets: 160
RBC: 4.49
RBC: 4.68
WBC: 13.1 — ABNORMAL HIGH
WBC: 15.4 — ABNORMAL HIGH
WBC: 17.9 — ABNORMAL HIGH

## 2011-03-04 LAB — URIC ACID: Uric Acid, Serum: 4.9

## 2011-03-04 LAB — SAMPLE TO BLOOD BANK

## 2011-03-04 LAB — URINALYSIS, ROUTINE W REFLEX MICROSCOPIC
Bilirubin Urine: NEGATIVE
Ketones, ur: NEGATIVE
Nitrite: NEGATIVE
Specific Gravity, Urine: 1.02
Urobilinogen, UA: 0.2

## 2011-03-04 LAB — LACTATE DEHYDROGENASE: LDH: 243

## 2011-03-04 LAB — MAGNESIUM: Magnesium: 5.7 — ABNORMAL HIGH

## 2011-03-04 LAB — RPR: RPR Ser Ql: NONREACTIVE

## 2011-03-04 LAB — URINE MICROSCOPIC-ADD ON: WBC, UA: NONE SEEN

## 2013-04-01 ENCOUNTER — Other Ambulatory Visit: Payer: Self-pay | Admitting: Family Medicine

## 2013-04-01 ENCOUNTER — Other Ambulatory Visit (HOSPITAL_COMMUNITY)
Admission: RE | Admit: 2013-04-01 | Discharge: 2013-04-01 | Disposition: A | Discharge status: Home / Self Care | Payer: 59 | Source: Ambulatory Visit | Attending: Family Medicine | Admitting: Family Medicine

## 2013-04-01 DIAGNOSIS — Z1151 Encounter for screening for human papillomavirus (HPV): Secondary | ICD-10-CM | POA: Insufficient documentation

## 2013-04-01 DIAGNOSIS — Z124 Encounter for screening for malignant neoplasm of cervix: Secondary | ICD-10-CM | POA: Insufficient documentation

## 2013-12-08 ENCOUNTER — Other Ambulatory Visit: Payer: Self-pay

## 2013-12-09 ENCOUNTER — Other Ambulatory Visit: Payer: Self-pay | Admitting: Family Medicine

## 2013-12-09 DIAGNOSIS — N6320 Unspecified lump in the left breast, unspecified quadrant: Secondary | ICD-10-CM

## 2013-12-16 ENCOUNTER — Ambulatory Visit
Admission: RE | Admit: 2013-12-16 | Discharge: 2013-12-16 | Disposition: A | Discharge status: Home / Self Care | Payer: 59 | Source: Ambulatory Visit | Attending: Family Medicine | Admitting: Family Medicine

## 2013-12-16 DIAGNOSIS — N6320 Unspecified lump in the left breast, unspecified quadrant: Secondary | ICD-10-CM

## 2015-06-01 ENCOUNTER — Other Ambulatory Visit: Payer: Self-pay | Admitting: Family Medicine

## 2015-06-01 DIAGNOSIS — N6320 Unspecified lump in the left breast, unspecified quadrant: Secondary | ICD-10-CM

## 2015-06-07 ENCOUNTER — Other Ambulatory Visit: Payer: 59

## 2015-06-13 ENCOUNTER — Ambulatory Visit
Admission: RE | Admit: 2015-06-13 | Discharge: 2015-06-13 | Disposition: A | Discharge status: Home / Self Care | Payer: 59 | Source: Ambulatory Visit | Attending: Family Medicine | Admitting: Family Medicine

## 2015-06-13 DIAGNOSIS — N6320 Unspecified lump in the left breast, unspecified quadrant: Secondary | ICD-10-CM

## 2015-06-13 DIAGNOSIS — N6002 Solitary cyst of left breast: Secondary | ICD-10-CM | POA: Diagnosis not present

## 2015-06-13 DIAGNOSIS — N63 Unspecified lump in breast: Secondary | ICD-10-CM | POA: Diagnosis not present

## 2015-06-23 MED FILL — OLMESARTAN MEDOXOMIL 40 MG: 40 | 90 days supply | Qty: 90 | Fill #0

## 2015-07-29 DIAGNOSIS — J020 Streptococcal pharyngitis: Secondary | ICD-10-CM | POA: Diagnosis not present

## 2015-10-03 MED FILL — OLMESARTAN MEDOXOMIL 40 MG: 40 | 90 days supply | Qty: 90 | Fill #1

## 2016-01-22 MED FILL — OLMESARTAN MEDOXOMIL 40 MG: 40 | 90 days supply | Qty: 90 | Fill #2

## 2016-05-02 MED FILL — OLMESARTAN MEDOXOMIL 40 MG: 40 | 90 days supply | Qty: 90 | Fill #3

## 2016-05-16 DIAGNOSIS — J4530 Mild persistent asthma, uncomplicated: Secondary | ICD-10-CM | POA: Diagnosis not present

## 2016-05-16 DIAGNOSIS — E559 Vitamin D deficiency, unspecified: Secondary | ICD-10-CM | POA: Diagnosis not present

## 2016-05-16 DIAGNOSIS — I10 Essential (primary) hypertension: Secondary | ICD-10-CM | POA: Diagnosis not present

## 2016-05-16 DIAGNOSIS — Z0000 Encounter for general adult medical examination without abnormal findings: Secondary | ICD-10-CM | POA: Diagnosis not present

## 2016-05-16 DIAGNOSIS — R7303 Prediabetes: Secondary | ICD-10-CM | POA: Diagnosis not present

## 2016-05-16 DIAGNOSIS — Z79899 Other long term (current) drug therapy: Secondary | ICD-10-CM | POA: Diagnosis not present

## 2016-06-27 DIAGNOSIS — J069 Acute upper respiratory infection, unspecified: Secondary | ICD-10-CM | POA: Diagnosis not present

## 2016-06-27 DIAGNOSIS — J029 Acute pharyngitis, unspecified: Secondary | ICD-10-CM | POA: Diagnosis not present

## 2016-06-27 DIAGNOSIS — J020 Streptococcal pharyngitis: Secondary | ICD-10-CM | POA: Diagnosis not present

## 2016-09-04 MED FILL — OLMESARTAN MEDOXOMIL 40 MG: 40 | 90 days supply | Qty: 90 | Fill #0

## 2016-11-28 MED FILL — SYMBICORT 80-4.5 MCG INH: 80-4.5 | 30 days supply | Qty: 10 | Fill #0

## 2016-12-24 MED FILL — OLMESARTAN MEDOXOMIL 40 MG: 40 | 90 days supply | Qty: 90 | Fill #1

## 2017-01-09 MED FILL — VENTOLIN HFA 90 MCG INHALER: 108 (90 BAS | 25 days supply | Qty: 18 | Fill #0

## 2017-04-01 MED FILL — OLMESARTAN MEDOXOMIL 40 MG: 40 | 30 days supply | Qty: 30 | Fill #2

## 2017-06-06 DIAGNOSIS — I10 Essential (primary) hypertension: Secondary | ICD-10-CM | POA: Diagnosis not present

## 2017-06-06 DIAGNOSIS — Z79899 Other long term (current) drug therapy: Secondary | ICD-10-CM | POA: Diagnosis not present

## 2017-06-06 DIAGNOSIS — Z0000 Encounter for general adult medical examination without abnormal findings: Secondary | ICD-10-CM | POA: Diagnosis not present

## 2017-06-06 DIAGNOSIS — R7303 Prediabetes: Secondary | ICD-10-CM | POA: Diagnosis not present

## 2017-06-06 DIAGNOSIS — E559 Vitamin D deficiency, unspecified: Secondary | ICD-10-CM | POA: Diagnosis not present

## 2017-09-05 DIAGNOSIS — R7303 Prediabetes: Secondary | ICD-10-CM | POA: Diagnosis not present

## 2018-07-14 DIAGNOSIS — I10 Essential (primary) hypertension: Secondary | ICD-10-CM | POA: Diagnosis not present

## 2018-07-14 DIAGNOSIS — R7303 Prediabetes: Secondary | ICD-10-CM | POA: Diagnosis not present

## 2018-07-14 DIAGNOSIS — Z79899 Other long term (current) drug therapy: Secondary | ICD-10-CM | POA: Diagnosis not present

## 2018-07-14 DIAGNOSIS — Z0000 Encounter for general adult medical examination without abnormal findings: Secondary | ICD-10-CM | POA: Diagnosis not present

## 2018-07-14 DIAGNOSIS — E559 Vitamin D deficiency, unspecified: Secondary | ICD-10-CM | POA: Diagnosis not present

## 2019-06-30 ENCOUNTER — Ambulatory Visit: Payer: No Typology Code available for payment source | Admitting: Allergy

## 2019-06-30 ENCOUNTER — Other Ambulatory Visit: Payer: Self-pay

## 2019-06-30 ENCOUNTER — Encounter: Payer: Self-pay | Admitting: Allergy

## 2019-06-30 VITALS — BP 142/94 | HR 79 | Temp 98.2°F | Resp 18 | Ht 60.5 in | Wt 128.2 lb

## 2019-06-30 DIAGNOSIS — J3089 Other allergic rhinitis: Secondary | ICD-10-CM | POA: Diagnosis not present

## 2019-06-30 DIAGNOSIS — J45909 Unspecified asthma, uncomplicated: Secondary | ICD-10-CM | POA: Insufficient documentation

## 2019-06-30 DIAGNOSIS — J302 Other seasonal allergic rhinitis: Secondary | ICD-10-CM | POA: Insufficient documentation

## 2019-06-30 DIAGNOSIS — J4520 Mild intermittent asthma, uncomplicated: Secondary | ICD-10-CM

## 2019-06-30 MED ORDER — ARNUITY ELLIPTA 100 MCG/ACT IN AEPB: 1.0000 | INHALATION_SPRAY | Freq: Every day | RESPIRATORY_TRACT | 3 refills | Status: DC

## 2019-06-30 MED ORDER — CARBINOXAMINE MALEATE 6 MG PO TABS: 1.0000 | ORAL_TABLET | Freq: Two times a day (BID) | ORAL | 5 refills | Status: DC | PRN

## 2019-06-30 MED ORDER — LEVALBUTEROL TARTRATE 45 MCG/ACT IN AERO: 2.0000 | INHALATION_SPRAY | RESPIRATORY_TRACT | 3 refills | Status: DC | PRN

## 2019-06-30 MED ORDER — AZELASTINE-FLUTICASONE 137-50 MCG/ACT NA SUSP: 1.0000 | Freq: Two times a day (BID) | NASAL | 5 refills | Status: DC

## 2019-06-30 NOTE — Assessment & Plan Note (Signed)
Perennial rhinitis symptoms for the past 40 years but worsening in the spring and winter.  Currently using Zyrtec and Nasacort with some benefit.  However noted some mood disorder with Zyrtec.  Tried Flonase and Astelin in the past with minimal improvement.  ENT evaluation over 10 years ago.  She has 2 cats at home.  Today's skin testing showed: Positive to dust mites and cats, trees, mold, cockroach.  Try Ryvent 1 tablet 1-2 times a day as needed.  Samples given.  This replaces zyrtec for now.   Start environmental control measures.  Start dymista (fluticasone + azelastine nasal spray combination) 1 spray per nostril twice a day.  This replaces Nasacort for now. If insurance does not cover it let us know.  Nasal saline spray (i.e., Simply Saline) or nasal saline lavage (i.e., NeilMed) is recommended as needed and prior to medicated nasal sprays.  Had a detailed discussion with patient/family that clinical history is suggestive of allergic rhinitis, and may benefit from allergy immunotherapy (AIT). Discussed in detail regarding the dosing, schedule, side effects (mild to moderate local allergic reaction and rarely systemic allergic reactions including anaphylaxis), and benefits (significant improvement in nasal symptoms, seasonal flares of asthma) of immunotherapy with the patient. There is significant time commitment involved with allergy shots, which includes weekly immunotherapy injections for first 9-12 months and then biweekly to monthly injections for 3-5 years.   Read about allergy injections.

## 2019-06-30 NOTE — Patient Instructions (Addendum)
Today's skin testing showed: Positive to dust mites and cats, trees, mold, cockroach.  Environmental:  Try Ryvent 1 tablet 1-2 times a day as needed. Definitely take every night.   This replaces zyrtec for now.   Start environmental control measures.  Start dymista (fluticasone + azelastine nasal spray combination) 1 spray per nostril twice a day.  This replaces Nasacort for now. If insurance does not cover it let us know.  Nasal saline spray (i.e., Simply Saline) or nasal saline lavage (i.e., NeilMed) is recommended as needed and prior to medicated nasal sprays.  Had a detailed discussion with patient/family that clinical history is suggestive of allergic rhinitis, and may benefit from allergy immunotherapy (AIT). Discussed in detail regarding the dosing, schedule, side effects (mild to moderate local allergic reaction and rarely systemic allergic reactions including anaphylaxis), and benefits (significant improvement in nasal symptoms, seasonal flares of asthma) of immunotherapy with the patient. There is significant time commitment involved with allergy shots, which includes weekly immunotherapy injections for first 9-12 months and then biweekly to monthly injections for 3-5 years.   Read about allergy injections.   Breathing:  Daily controller medication(s): start Arnuity 100 1 puff daily and rinse mouth afterwards.   If it's not covered by insurance let us know.  Prior to physical activity: May use levoalbuterol rescue inhaler 2 puffs 5 to 15 minutes prior to strenuous physical activities. Rescue medications: May use levoalbuterol rescue inhaler 2 puffs every 4 to 6 hours as needed for shortness of breath, chest tightness, coughing, and wheezing. Monitor frequency of use.  Asthma control goals:  Full participation in all desired activities (may need albuterol before activity) Albuterol use two times or less a week on average (not counting use with activity) Cough interfering with  sleep two times or less a month Oral steroids no more than once a year No hospitalizations  Follow up in 1 month or sooner if needed.   Control of House Dust Mite Allergen . Dust mite allergens are a common trigger of allergy and asthma symptoms. While they can be found throughout the house, these microscopic creatures thrive in warm, humid environments such as bedding, upholstered furniture and carpeting. . Because so much time is spent in the bedroom, it is essential to reduce mite levels there.  . Encase pillows, mattresses, and box springs in special allergen-proof fabric covers or airtight, zippered plastic covers.  . Bedding should be washed weekly in hot water (130 F) and dried in a hot dryer. Allergen-proof covers are available for comforters and pillows that can't be regularly washed.  Wendee Copp the allergy-proof covers every few months. Minimize clutter in the bedroom. Keep pets out of the bedroom.  Marland Kitchen Keep humidity less than 50% by using a dehumidifier or air conditioning. You can buy a humidity measuring device called a hygrometer to monitor this.  . If possible, replace carpets with hardwood, linoleum, or washable area rugs. If that's not possible, vacuum frequently with a vacuum that has a HEPA filter. . Remove all upholstered furniture and non-washable window drapes from the bedroom. . Remove all non-washable stuffed toys from the bedroom.  Wash stuffed toys weekly. Pet Allergen Avoidance: . Contrary to popular opinion, there are no "hypoallergenic" breeds of dogs or cats. That is because people are not allergic to an animal's hair, but to an allergen found in the animal's saliva, dander (dead skin flakes) or urine. Pet allergy symptoms typically occur within minutes. For some people, symptoms can build up and become  most severe 8 to 12 hours after contact with the animal. People with severe allergies can experience reactions in public places if dander has been transported on the pet  owners' clothing. Marland Kitchen Keeping an animal outdoors is only a partial solution, since homes with pets in the yard still have higher concentrations of animal allergens. . Before getting a pet, ask your allergist to determine if you are allergic to animals. If your pet is already considered part of your family, try to minimize contact and keep the pet out of the bedroom and other rooms where you spend a great deal of time. . As with dust mites, vacuum carpets often or replace carpet with a hardwood floor, tile or linoleum. . High-efficiency particulate air (HEPA) cleaners can reduce allergen levels over time. . While dander and saliva are the source of cat and dog allergens, urine is the source of allergens from rabbits, hamsters, mice and [place] pigs; so ask a non-allergic family member to clean the animal's cage. . If you have a pet allergy, talk to your allergist about the potential for allergy immunotherapy (allergy shots). This strategy can often provide long-term relief. Mold Control . Mold and fungi can grow on a variety of surfaces provided certain temperature and moisture conditions exist.  . Outdoor molds grow on plants, decaying vegetation and soil. The major outdoor mold, Alternaria and Cladosporium, are found in very high numbers during hot and dry conditions. Generally, a late summer - fall peak is seen for common outdoor fungal spores. Rain will temporarily lower outdoor mold spore count, but counts rise rapidly when the rainy period ends. . The most important indoor molds are Aspergillus and Penicillium. Dark, humid and poorly ventilated basements are ideal sites for mold growth. The next most common sites of mold growth are the bathroom and the kitchen. Outdoor (Seasonal) Mold Control . Use air conditioning and keep windows closed. . Avoid exposure to decaying vegetation. Marland Kitchen Avoid leaf raking. . Avoid grain handling. . Consider wearing a face mask if working in moldy areas.  Indoor  (Perennial) Mold Control  . Maintain humidity below 50%. . Get rid of mold growth on hard surfaces with water, detergent and, if necessary, 5% bleach (do not mix with other cleaners). Then dry the area completely. If mold covers an area more than 10 square feet, consider hiring an indoor environmental professional. . For clothing, washing with soap and water is best. If moldy items cannot be cleaned and dried, throw them away. . Remove sources e.g. contaminated carpets. . Repair and seal leaking roofs or pipes. Using dehumidifiers in damp basements may be helpful, but empty the water and clean units regularly to prevent mildew from forming. All rooms, especially basements, bathrooms and kitchens, require ventilation and cleaning to deter mold and mildew growth. Avoid carpeting on concrete or damp floors, and storing items in damp areas. Cockroach Allergen Avoidance Cockroaches are often found in the homes of densely populated urban areas, schools or commercial buildings, but these creatures can lurk almost anywhere. This does not mean that you have a dirty house or living area. . Block all areas where roaches can enter the home. This includes crevices, wall cracks and windows.  . Cockroaches need water to survive, so fix and seal all leaky faucets and pipes. Have an exterminator go through the house when your family and pets are gone to eliminate any remaining roaches. Marland Kitchen Keep food in lidded containers and put pet food dishes away after your pets are  done eating. Vacuum and sweep the floor after meals, and take out garbage and recyclables. Use lidded garbage containers in the kitchen. Wash dishes immediately after use and clean under stoves, refrigerators or toasters where crumbs can accumulate. Wipe off the stove and other kitchen surfaces and cupboards regularly. Reducing Pollen Exposure . Pollen seasons: trees (spring), grass (summer) and ragweed/weeds (fall). Marland Kitchen Keep windows closed in your home and  car to lower pollen exposure.  Lilian Kapur air conditioning in the bedroom and throughout the house if possible.  . Avoid going out in dry windy days - especially early morning. . Pollen counts are highest between 5 - 10 AM and on dry, hot and windy days.  . Save outside activities for late afternoon or after a heavy rain, when pollen levels are lower.  . Avoid mowing of grass if you have grass pollen allergy. Marland Kitchen Be aware that pollen can also be transported indoors on people and pets.  . Dry your clothes in an automatic dryer rather than hanging them outside where they might collect pollen.  . Rinse hair and eyes before bedtime.

## 2019-06-30 NOTE — Assessment & Plan Note (Addendum)
Noted some respiratory symptoms for the last 4 years but worse the last 10.  Using albuterol the last 2 weeks nightly but it is causing palpitations.  Used to be on Symbicort for a short while in the past.  Today's act score is 15.  Today's spirometry showed some restriction with 6% improvement in FEV1 post bronchodilator treatment.  She clinically felt better and liked Xopenex as it did not cause palpitation. 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 Daily controller medication(s): start Arnuity 100 1 puff daily and rinse mouth afterwards.   If it's not covered by insurance let us know.  Prior to physical activity: May use levoalbuterol rescue inhaler 2 puffs 5 to 15 minutes prior to strenuous physical activities. Rescue medications: May use levoalbuterol rescue inhaler 2 puffs every 4 to 6 hours as needed for shortness of breath, chest tightness, coughing, and wheezing. Monitor frequency of use.  Repeat spirometry at next visit.

## 2019-06-30 NOTE — Progress Notes (Signed)
New Patient Note  RE: Cindy [name] MRN: [id number] DOB: April 21, 1971 Date of Office Visit: 06/30/2019  Referring provider: Mila Palmer, MD Primary care provider: Mila Palmer, MD  Chief Complaint: Allergies, Nasal Congestion, Wheezing, and Shortness of Breath  History of Present Illness: I had the pleasure of seeing Cindy [name] for initial evaluation at the [facility] on 06/30/2019. She is a 49 y.o. female, who is referred here by Mila Palmer, MD for the evaluation of allergies.  Rhinitis: She reports symptoms of nasal congestion, PND which is worse at night. Some itching of the throat, ears and nose. Symptoms have been going on for 40 years. The symptoms are present all year around with worsening in spring and winter. Other triggers include exposure to unknown. Anosmia: no. Headache: sometimes. She has used zyrtec, Nasacort with some improvement in symptoms. Tried Flonase and Astelin with minimal benefit. The other antihistamines were not effective such as allegra and Claritin. Sinus infections: none in the past year. Previous work up includes: no. Zyrtec has made her moody.  Previous ENT evaluation: yes about 10+ years ago. Previous sinus imaging: not recently. History of nasal polyps: no. Last eye exam: 1 year ago. History of reflux: no.  Breathing: ACT score 15.  She reports symptoms of chest tightness, wheezing, nocturnal awakenings for 40 years but worse the past 10 years. Current medications include albuterol prn which help but it causes some palpitations. She reports not using aerochamber with inhalers. She tried the following inhalers: Symbicort. Main triggers are unknown. In the last month, frequency of symptoms: nightly for the past 2 weeks. Frequency of nocturnal symptoms: depends. Frequency of SABA use: daily for the past 2 weeks. Interference with physical activity: no. Sleep is undisturbed. In the last 12 months, emergency room visits/urgent care  visits/doctor office visits or hospitalizations due to respiratory issues: no. In the last 12 months, oral steroids courses: no. Lifetime history of hospitalization for respiratory issues: 1-2 times as a child. Prior intubations: no. History of pneumonia: no. She was not evaluated by allergist/pulmonologist in the past. Smoking exposure: no. Up to date with flu vaccine: yes.   Assessment and Plan: Cindy [name] is a 49 y.o. female with: Other allergic rhinitis Perennial rhinitis symptoms for the past 40 years but worsening in the spring and winter.  Currently using Zyrtec and Nasacort with some benefit.  However noted some mood disorder with Zyrtec.  Tried Flonase and Astelin in the past with minimal improvement.  ENT evaluation over 10 years ago.  She has 2 cats at home.  Today's skin testing showed: Positive to dust mites and cats, trees, mold, cockroach.  Try Ryvent 1 tablet 1-2 times a day as needed.  Samples given.  This replaces zyrtec for now.   Start environmental control measures.  Start dymista (fluticasone + azelastine nasal spray combination) 1 spray per nostril twice a day.  This replaces Nasacort for now. If insurance does not cover it let us know.  Nasal saline spray (i.e., Simply Saline) or nasal saline lavage (i.e., NeilMed) is recommended as needed and prior to medicated nasal sprays.  Had a detailed discussion with patient/family that clinical history is suggestive of allergic rhinitis, and may benefit from allergy immunotherapy (AIT). Discussed in detail regarding the dosing, schedule, side effects (mild to moderate local allergic reaction and rarely systemic allergic reactions including anaphylaxis), and benefits (significant improvement in nasal symptoms, seasonal flares of asthma) of immunotherapy with the patient. There is significant time commitment involved  with allergy shots, which includes weekly immunotherapy injections for first 9-12 months and then biweekly to monthly  injections for 3-5 years.   Read about allergy injections.   Reactive airway disease Noted some respiratory symptoms for the last 4 years but worse the last 10.  Using albuterol the last 2 weeks nightly but it is causing palpitations.  Used to be on Symbicort for a short while in the past.  Today's act score is 15.  Today's spirometry showed some restriction with 6% improvement in FEV1 post bronchodilator treatment.  She clinically felt better and liked Xopenex as it did not cause palpitation. 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 Daily controller medication(s): start Arnuity 100 1 puff daily and rinse mouth afterwards.   If it's not covered by insurance let us know.  Prior to physical activity: May use levoalbuterol rescue inhaler 2 puffs 5 to 15 minutes prior to strenuous physical activities. Rescue medications: May use levoalbuterol rescue inhaler 2 puffs every 4 to 6 hours as needed for shortness of breath, chest tightness, coughing, and wheezing. Monitor frequency of use.  Repeat spirometry at next visit.  Return in about 4 weeks (around 07/28/2019).  Follow-up with PCP regarding elevated blood pressure.  Meds ordered this encounter  Medications  . Azelastine-Fluticasone 137-50 MCG/ACT SUSP    Sig: Place 1 spray into the nose 2 (two) times daily.    Dispense:  23 g    Refill:  5  . Carbinoxamine Maleate (RYVENT) 6 MG TABS    Sig: Take 1 tablet by mouth 2 (two) times daily as needed.    Dispense:  60 tablet    Refill:  5  . Fluticasone Furoate (ARNUITY ELLIPTA) 100 MCG/ACT AEPB    Sig: Inhale 1 puff into the lungs daily.    Dispense:  30 each    Refill:  3  . levalbuterol (XOPENEX HFA) 45 MCG/ACT inhaler    Sig: Inhale 2 puffs into the lungs every 4 (four) hours as needed for wheezing or shortness of breath (coughing).    Dispense:  1 Inhaler    Refill:  3   Other allergy screening: Food allergy:  no Medication allergy: yes  Minocycline - dizziness and rash Hymenoptera allergy: no Urticaria: no Eczema:yes as a child History of recurrent infections suggestive of immunodeficency: no  Diagnostics: Spirometry:  Tracings reviewed. Her effort: Good reproducible efforts. FVC: 2.15L FEV1: 1.74L, 71% predicted FEV1/FVC ratio: 81% Interpretation: Spirometry consistent with possible restrictive disease with 6% improvement in FEV1 post bronchodilator treatment.  Please see scanned spirometry results for details.  Skin Testing: Environmental allergy panel. Positive test to: dust mites and cats, trees, mold, cockroach. Results discussed with patient/family. Airborne Adult Perc - 06/30/19 1405    Time Antigen Placed  1405    Allergen Manufacturer  Waynette Buttery    Location  Back    Number of Test  59    Panel 1  Select    1. Control-Buffer 50% Glycerol  Negative    2. Control-Histamine 1 mg/ml  2+    3. Albumin saline  Negative    4. Bahia  Negative    5. French Southern Territories  Negative    6. Johnson  Negative    7. Kentucky Blue  Negative    8. Meadow Fescue  Negative    9. Perennial Rye  Negative    10. Sweet Vernal  Negative    11. Timothy  Negative  12. Cocklebur  Negative    13. Burweed Marshelder  Negative    14. Ragweed, short  Negative    15. Ragweed, Giant  Negative    16. Plantain,  English  Negative    17. Lamb's Quarters  Negative    18. Sheep Sorrell  Negative    19. Rough Pigweed  Negative    20. Marsh Elder, Rough  Negative    21. Mugwort, Common  Negative    22. Ash mix  Negative    23. Birch mix  Negative    24. Beech American  Negative    25. Box, Elder  Negative    26. Cedar, red  Negative    27. Cottonwood, Guinea-Bissau  Negative    28. Elm mix  Negative    29. Hickory mix  Negative    30. Maple mix  Negative    31. Oak, Guinea-Bissau mix  Negative    32. Pecan Pollen  Negative    33. Pine mix  Negative    34. Sycamore Eastern  Negative    35. Walnut, Black Pollen  Negative     36. Alternaria alternata  Negative    37. Cladosporium Herbarum  Negative    38. Aspergillus mix  Negative    39. Penicillium mix  Negative    40. Bipolaris sorokiniana (Helminthosporium)  Negative    41. Drechslera spicifera (Curvularia)  Negative    42. Mucor plumbeus  Negative    43. Fusarium moniliforme  Negative    44. Aureobasidium pullulans (pullulara)  Negative    45. Rhizopus oryzae  Negative    46. Botrytis cinera  Negative    47. Epicoccum nigrum  Negative    48. Phoma betae  Negative    49. Candida Albicans  Negative    50. Trichophyton mentagrophytes  Negative    51. Mite, D Farinae  5,000 AU/ml  2+    52. Mite, D Pteronyssinus  5,000 AU/ml  2+    53. Cat Hair 10,000 BAU/ml  2+    54.  Dog Epithelia  Negative    55. Mixed Feathers  Negative    56. Horse Epithelia  Negative    57. Cockroach, German  Negative    58. Mouse  Negative    59. Tobacco Leaf  Negative     Intradermal - 06/30/19 1515    Time Antigen Placed  1515    Allergen Manufacturer  Greer    Location  Arm    Number of Test  13    Intradermal  Select    Control  Negative    French Southern Territories  Negative    Johnson  Negative    7 Grass  Negative    Ragweed mix  Negative    Weed mix  Negative    Tree mix  --   +/-   Mold 1  --   +/-   Mold 2  2+    Mold 3  Negative    Mold 4  3+    Dog  Negative    Cockroach  2+       Past Medical History: Patient Active Problem List   Diagnosis Date Noted  . Other allergic rhinitis 06/30/2019  . Reactive airway disease 06/30/2019   Past Medical History:  Diagnosis Date  . Asthma   . Urticaria    Past Surgical History: History reviewed. No pertinent surgical history. Medication List:  Current Outpatient Medications  Medication Sig Dispense Refill  .  Albuterol Sulfate 108 (90 Base) MCG/ACT AEPB Inhale 2 puffs into the lungs every 4 (four) hours as needed.    Marland Kitchen. CALCIUM-VITAMIN D PO Take 1 tablet by mouth daily.    . cetirizine (ZYRTEC) 10 MG chewable  tablet Chew 10 mg by mouth daily.    . magnesium 30 MG tablet Take 30 mg by mouth daily.    Marland Kitchen. olmesartan (BENICAR) 40 MG tablet Take 40 mg by mouth daily.    Marland Kitchen. triamcinolone (NASACORT ALLERGY 24HR) 55 MCG/ACT AERO nasal inhaler Place 1 spray into the nose daily.    . Azelastine-Fluticasone 137-50 MCG/ACT SUSP Place 1 spray into the nose 2 (two) times daily. 23 g 5  . Carbinoxamine Maleate (RYVENT) 6 MG TABS Take 1 tablet by mouth 2 (two) times daily as needed. 60 tablet 5  . Fluticasone Furoate (ARNUITY ELLIPTA) 100 MCG/ACT AEPB Inhale 1 puff into the lungs daily. 30 each 3  . levalbuterol (XOPENEX HFA) 45 MCG/ACT inhaler Inhale 2 puffs into the lungs every 4 (four) hours as needed for wheezing or shortness of breath (coughing). 1 Inhaler 3   No current facility-administered medications for this visit.   Allergies: Not on File Social History: Social History   Socioeconomic History  . Marital status: Married    Spouse name: Not on file  . Number of children: Not on file  . Years of education: Not on file  . Highest education level: Not on file  Occupational History  . Not on file  Tobacco Use  . Smoking status: Never Smoker  . Smokeless tobacco: Never Used  Substance and Sexual Activity  . Alcohol use: Not Currently  . Drug use: Never  . Sexual activity: Not on file  Other Topics Concern  . Not on file  Social History Narrative  . Not on file   Social Determinants of Health   Financial Resource Strain:   . Difficulty of Paying Living Expenses: Not on file  Food Insecurity:   . Worried About Programme researcher, broadcasting/film/videounning Out of Food in the Last Year: Not on file  . Ran Out of Food in the Last Year: Not on file  Transportation Needs:   . Lack of Transportation (Medical): Not on file  . Lack of Transportation (Non-Medical): Not on file  Physical Activity:   . Days of Exercise per Week: Not on file  . Minutes of Exercise per Session: Not on file  Stress:   . Feeling of Stress : Not on file   Social Connections:   . Frequency of Communication with Friends and Family: Not on file  . Frequency of Social Gatherings with Friends and Family: Not on file  . Attends Religious Services: Not on file  . Active Member of Clubs or Organizations: Not on file  . Attends BankerClub or Organization Meetings: Not on file  . Marital Status: Not on file   Lives in a house built in 1996. Smoking: denies Occupation: Armed forces training and education officerN  Environmental HistorySurveyor, minerals: Water Damage/mildew in the house: no Engineer, civil (consulting)Carpet in the family room: yes Carpet in the bedroom: yes Heating: gas Cooling: central Pet: yes 2 cats x 2 yrs  Family History: Family History  Problem Relation Age of Onset  . Allergic rhinitis Father   . Asthma Father   . Asthma Brother    Review of Systems  Constitutional: Negative for appetite change, chills, fever and unexpected weight change.  HENT: Positive for congestion and postnasal drip. Negative for rhinorrhea.   Eyes: Negative for itching.  Respiratory: Negative  for cough, chest tightness, shortness of breath and wheezing.   Cardiovascular: Negative for chest pain.  Gastrointestinal: Positive for nausea. Negative for abdominal pain.  Genitourinary: Negative for difficulty urinating.  Skin: Negative for rash.  Allergic/Immunologic: Positive for environmental allergies.  Neurological: Positive for headaches.   Objective: BP (!) 142/94 (BP Location: Left Arm, Patient Position: Sitting, Cuff Size: Normal) Comment: per patient she has not taken her BP med x 2 days  Pulse 79   Temp 98.2 F (36.8 C) (Temporal)   Resp 18   Ht 5' 0.5" (1.537 m)   Wt 128 lb 3.2 oz (58.2 kg)   SpO2 98%   BMI 24.63 kg/m  Body mass index is 24.63 kg/m. Physical Exam  Constitutional: She is oriented to person, place, and time. She appears well-developed and well-nourished.  HENT:  Head: Normocephalic and atraumatic.  Right Ear: External ear normal.  Left Ear: External ear normal.  Nose: Nose normal.   Mouth/Throat: Oropharynx is clear and moist.  Eyes: Conjunctivae and EOM are normal.  Cardiovascular: Normal rate, regular rhythm and normal heart sounds. Exam reveals no gallop and no friction rub.  No murmur heard. Pulmonary/Chest: Effort normal and breath sounds normal. She has no wheezes. She has no rales.  Abdominal: Soft.  Musculoskeletal:     Cervical back: Neck supple.  Neurological: She is alert and oriented to person, place, and time.  Skin: Skin is warm. No rash noted.  Psychiatric: She has a normal mood and affect. Her behavior is normal.  Nursing note and vitals reviewed.  The plan was reviewed with the patient/family, and all questions/concerned were addressed.  It was my pleasure to see Cindy [name] today and participate in her care. Please feel free to contact me with any questions or concerns.  Sincerely,  Rexene Alberts, DO Allergy & Immunology  [facility] office: [phone] [facility] office: [place] office: [phone]

## 2019-07-06 ENCOUNTER — Telehealth: Payer: Self-pay

## 2019-07-06 ENCOUNTER — Telehealth: Payer: Self-pay | Admitting: Allergy

## 2019-07-06 MED ORDER — CARBINOXAMINE MALEATE 4 MG PO TABS: 1.0000 | ORAL_TABLET | Freq: Three times a day (TID) | ORAL | 5 refills | Status: DC

## 2019-07-06 NOTE — Telephone Encounter (Signed)
Patient called and need the Ryvent and Dymista  To [facility] [phone]

## 2019-07-06 NOTE — Telephone Encounter (Signed)
Call to [facility], both medications need a PA.  They will resend.

## 2019-07-06 NOTE — Telephone Encounter (Signed)
Medications were sent in on 06/30/19.

## 2019-07-06 NOTE — Telephone Encounter (Signed)
PA has been submitted for Dymista.  PA for Ryvent, not needed, just needed to be generic.  (Dymista)   [facility] is reviewing your PA request. Typically an electronic response will be received within 72 hours. To check for an update later, open this request from your dashboard.  You may close this dialog and return to your dashboard to perform other tasks.

## 2019-07-07 MED ORDER — OLOPATADINE HCL 0.6 % NA SOLN: 1.0000 | Freq: Two times a day (BID) | NASAL | 5 refills | Status: DC | PRN

## 2019-07-07 MED ORDER — AZELASTINE HCL 0.15 % NA SOLN: 1.0000 | Freq: Two times a day (BID) | NASAL | 5 refills | Status: DC

## 2019-07-07 NOTE — Addendum Note (Signed)
Addended by: Ellamae Sia on: 07/07/2019 12:48 PM   Modules accepted: Orders

## 2019-07-07 NOTE — Telephone Encounter (Signed)
Patient has sneezing with azelastine.  Will switch to patanase 1 spray BID prn.  Please call and let her know.

## 2019-07-07 NOTE — Addendum Note (Signed)
Addended by: Ellamae Sia on: 07/07/2019 02:13 PM   Modules accepted: Orders

## 2019-07-07 NOTE — Telephone Encounter (Addendum)
Fax from [facility]:  Dymista has been denied Medications will need to be split.  Call to patient to notify her of the denial and to let her know that we will have to split the medication.  Pt states that the azelastine makes her sneeze non stop, she is asking if there is any other nasal antihistamines that she could use.  Explained to patient that we would ask Dr Selena Batten and call her back today.   Dr Selena Batten, please advise, thank you.

## 2019-07-07 NOTE — Telephone Encounter (Signed)
She already has Nasacort at home. She can use Nasacort 1 spray per nostril BID. I sent in azelastine to add to this. 1 spray per nostril BID.

## 2019-07-07 NOTE — Addendum Note (Signed)
Addended by: Teressa Senter on: 07/07/2019 12:03 PM   Modules accepted: Orders

## 2019-07-28 ENCOUNTER — Ambulatory Visit: Payer: No Typology Code available for payment source | Admitting: Allergy

## 2019-07-28 ENCOUNTER — Encounter: Payer: Self-pay | Admitting: Allergy

## 2019-07-28 ENCOUNTER — Other Ambulatory Visit: Payer: Self-pay

## 2019-07-28 VITALS — BP 128/86 | HR 86 | Temp 98.1°F | Resp 18 | Ht 60.0 in

## 2019-07-28 DIAGNOSIS — J302 Other seasonal allergic rhinitis: Secondary | ICD-10-CM

## 2019-07-28 DIAGNOSIS — J3089 Other allergic rhinitis: Secondary | ICD-10-CM | POA: Diagnosis not present

## 2019-07-28 DIAGNOSIS — J4520 Mild intermittent asthma, uncomplicated: Secondary | ICD-10-CM | POA: Diagnosis not present

## 2019-07-28 NOTE — Progress Notes (Signed)
Follow Up Note  RE: Cindy [name] MRN: [id number] DOB: November 20, 1970 Date of Office Visit: 07/28/2019  Referring provider: Jonathon Jordan, MD Primary care provider: Jonathon Jordan, MD  Chief Complaint: Asthma  History of Present Illness: I had the pleasure of seeing Cindy [name] for a follow up visit at the [facility] [place] [facility] [place] on 07/28/2019. She is a 49 y.o. female, who is being followed for allergic rhinitis and reactive airway disease. Her previous allergy office visit was on 06/30/2019 with Dr. Maudie Mercury. Today is a regular follow up visit.  Other allergic rhinitis Currently taking carbinoxamine 4mg  1 tablet at night around 10PM which helps but does not seem to last throughout the night as she wakes up around 5AM for nasal congestion.   But she is sleeping better and not waking up night for shortness of breath anymore.  The dymista was not covered and there was a confusion as to which nasal sprays she was supposed to be doing.  Takes azelastine 1 spray twice a day and patanase 1 spray twice a day with unsure benefit. Not using Nasacort.   Still having some throat itching and drainage.   Reactive airway disease Used albuterol 1-2 times since the last visit right after the last visit. Now taking Arnuity 100 1 puff daily and has not been waking up with shortness of breath. Denies any SOB, coughing, wheezing, chest tightness, nocturnal awakenings, ER/urgent care visits or prednisone use since the last visit.  Assessment and Plan: Cindy [name] is a 49 y.o. female with: Seasonal and perennial allergic rhinitis Past history - Perennial rhinitis symptoms for the past 40 years but worsening in the spring and winter.  Mood disorder with Zyrtec.  Tried Flonase and Astelin in the past with minimal improvement.  ENT evaluation over 10 years ago.  She has 2 cats at home. 2021 skin testing showed: Positive to dust mites and cats, trees, mold, cockroach. Interim history - carbinoxamine seems to work but  doesn't last overnight. Was using azelastine and patanase nasal spray. Dymista not covered. Not interested in allergy injections at this time due to time commitment needed.   Increase carbinoxamine 4mg  tablet to 1 and 1/2 tablet (total 6mg ) at night and see if it helps.  May try to take either Claritin or allegra in the morning - these are less sedating than zyrtec, Xyzal.  OR can also try to take carbinoxamine in the morning.  Continue environmental control measures.  Take azelastine OR patanase 1 spray per nostril twice a day for drainage.   Restart Nasacort 1 spray per nostril twice a day for nasal congestion.   Reactive airway disease Past history - Noted some respiratory symptoms for the last 4 years but worse the last 10.  Using albuterol the last 2 weeks nightly but it is causing palpitations.  Used to be on Symbicort for a short while in the past. 2021 spirometry showed some restriction with 6% improvement in FEV1 post bronchodilator treatment.  She clinically felt better and liked Xopenex as it did not cause palpitation. Interim history - doing better with Arnuity.  ACT score 20.  Daily controller medication(s):continue Arnuity 100 1 puff daily and rinse mouth afterwards.   Prior to physical activity:May use levoalbuterol rescue inhaler 2 puffs 5 to 15 minutes prior to strenuous physical activities.  Rescue medications:May use levoalbuterol rescue inhaler 2 puffs every 4 to 6 hours as needed for shortness of breath, chest tightness, coughing, and wheezing. Monitor frequency of use.  Return in about 2 months (around 09/27/2019).  Diagnostics: Spirometry:  Tracings reviewed. Her effort: Good reproducible efforts. FVC: 2.48L FEV1: 1.70L, 80% predicted FEV1/FVC ratio: 69% Interpretation: Spirometry consistent with normal pattern.  Please see scanned spirometry results for details.  Medication List:  Current Outpatient Medications  Medication Sig Dispense Refill  .  Albuterol Sulfate 108 (90 Base) MCG/ACT AEPB Inhale 2 puffs into the lungs every 4 (four) hours as needed.    . Azelastine-Fluticasone 137-50 MCG/ACT SUSP Place 1 spray into the nose 2 (two) times daily. 23 g 5  . CALCIUM-VITAMIN D PO Take 1 tablet by mouth daily.    . Carbinoxamine Maleate 4 MG TABS Take 1 tablet (4 mg total) by mouth every 8 (eight) hours. 30 tablet 5  . cetirizine (ZYRTEC) 10 MG chewable tablet Chew 10 mg by mouth daily.    . Fluticasone Furoate (ARNUITY ELLIPTA) 100 MCG/ACT AEPB Inhale 1 puff into the lungs daily. 30 each 3  . levalbuterol (XOPENEX HFA) 45 MCG/ACT inhaler Inhale 2 puffs into the lungs every 4 (four) hours as needed for wheezing or shortness of breath (coughing). 1 Inhaler 3  . magnesium 30 MG tablet Take 30 mg by mouth daily.    Marland Kitchen olmesartan (BENICAR) 40 MG tablet Take 40 mg by mouth daily.    . Olopatadine HCl 0.6 % SOLN Place 1 spray into the nose 2 (two) times daily as needed (runny nose). 30.5 g 5  . triamcinolone (NASACORT ALLERGY 24HR) 55 MCG/ACT AERO nasal inhaler Place 1 spray into the nose daily.     No current facility-administered medications for this visit.   Allergies: No Known Allergies I reviewed her past medical history, social history, family history, and environmental history and no significant changes have been reported from her previous visit.  Review of Systems  Constitutional: Negative for appetite change, chills, fever and unexpected weight change.  HENT: Positive for congestion. Negative for postnasal drip and rhinorrhea.   Eyes: Negative for itching.  Respiratory: Negative for cough, chest tightness, shortness of breath and wheezing.   Cardiovascular: Negative for chest pain.  Gastrointestinal: Negative for abdominal pain and nausea.  Genitourinary: Negative for difficulty urinating.  Skin: Negative for rash.  Allergic/Immunologic: Positive for environmental allergies.  Neurological: Negative for headaches.   Objective: BP  128/86   Pulse 86   Temp 98.1 F (36.7 C) (Temporal)   Resp 18   Ht 5' (1.524 m)   SpO2 100%   BMI 25.04 kg/m  Body mass index is 25.04 kg/m. Physical Exam  Constitutional: She is oriented to person, place, and time. She appears well-developed and well-nourished.  HENT:  Head: Normocephalic and atraumatic.  Right Ear: External ear normal.  Left Ear: External ear normal.  Nose: Nose normal.  Mouth/Throat: Oropharynx is clear and moist.  Eyes: Conjunctivae and EOM are normal.  Cardiovascular: Normal rate, regular rhythm and normal heart sounds. Exam reveals no gallop and no friction rub.  No murmur heard. Pulmonary/Chest: Effort normal and breath sounds normal. She has no wheezes. She has no rales.  Abdominal: Soft.  Musculoskeletal:     Cervical back: Neck supple.  Neurological: She is alert and oriented to person, place, and time.  Skin: Skin is warm. No rash noted.  Psychiatric: She has a normal mood and affect. Her behavior is normal.  Nursing note and vitals reviewed.  Previous notes and tests were reviewed. The plan was reviewed with the patient/family, and all questions/concerned were addressed.  It was my  pleasure to see Cindy [name] today and participate in her care. Please feel free to contact me with any questions or concerns.  Sincerely,  Wyline Mood, DO Allergy & Immunology  [facility] office: [phone] [facility] office: [phone] [place] office: [phone]

## 2019-07-28 NOTE — Assessment & Plan Note (Signed)
Past history - Perennial rhinitis symptoms for the past 40 years but worsening in the spring and winter.  Mood disorder with Zyrtec.  Tried Flonase and Astelin in the past with minimal improvement.  ENT evaluation over 10 years ago.  She has 2 cats at home. 2021 skin testing showed: Positive to dust mites and cats, trees, mold, cockroach. Interim history - carbinoxamine seems to work but doesn't last overnight. Was using azelastine and patanase nasal spray. Dymista not covered. Not interested in allergy injections at this time due to time commitment needed.   Increase carbinoxamine 4mg  tablet to 1 and 1/2 tablet (total 6mg ) at night and see if it helps.  May try to take either Claritin or allegra in the morning - these are less sedating than zyrtec, Xyzal.  OR can also try to take carbinoxamine in the morning.  Continue environmental control measures.  Take azelastine OR patanase 1 spray per nostril twice a day for drainage.   Restart Nasacort 1 spray per nostril twice a day for nasal congestion.

## 2019-07-28 NOTE — Patient Instructions (Addendum)
Other allergic rhinitis  Past skin testing showed: Positive to dust mites and cats, trees, mold, cockroach.  Increase carbinoxamine 4mg  tablet to 1 and 1/2 tablet at night and see if it helps.  You may try to take either Claritin or allegra in the morning - these are less sedating than zyrtec, Xyzal.  You an also try to take carbinoxamine in the morning.  Continue environmental control measures.  Take azelastine OR patanase 1 spray per nostril twice a day for drainage.   Restart Nasacort 1 spray per nostril twice a day for nasal congestion.   Reactive airway disease  Today's spirometry was normal.   Daily controller medication(s):continue Arnuity 100 1 puff daily and rinse mouth afterwards.   Prior to physical activity:May use levoalbuterol rescue inhaler 2 puffs 5 to 15 minutes prior to strenuous physical activities.  Rescue medications:May use levoalbuterol rescue inhaler 2 puffs every 4 to 6 hours as needed for shortness of breath, chest tightness, coughing, and wheezing. Monitor frequency of use.   Follow up in 2 months or sooner if needed.

## 2019-07-28 NOTE — Assessment & Plan Note (Signed)
Past history - Noted some respiratory symptoms for the last 4 years but worse the last 10.  Using albuterol the last 2 weeks nightly but it is causing palpitations.  Used to be on Symbicort for a short while in the past. 2021 spirometry showed some restriction with 6% improvement in FEV1 post bronchodilator treatment.  She clinically felt better and liked Xopenex as it did not cause palpitation. Interim history - doing better with Arnuity.  ACT score 20.  Daily controller medication(s):continue Arnuity 100 1 puff daily and rinse mouth afterwards.   Prior to physical activity:May use levoalbuterol rescue inhaler 2 puffs 5 to 15 minutes prior to strenuous physical activities.  Rescue medications:May use levoalbuterol rescue inhaler 2 puffs every 4 to 6 hours as needed for shortness of breath, chest tightness, coughing, and wheezing. Monitor frequency of use.

## 2019-09-27 ENCOUNTER — Encounter: Payer: Self-pay | Admitting: Allergy

## 2019-09-27 ENCOUNTER — Other Ambulatory Visit: Payer: Self-pay

## 2019-09-27 ENCOUNTER — Ambulatory Visit (INDEPENDENT_AMBULATORY_CARE_PROVIDER_SITE_OTHER): Payer: No Typology Code available for payment source | Admitting: Allergy

## 2019-09-27 VITALS — BP 126/80 | HR 68 | Temp 97.6°F | Resp 18 | Ht 60.0 in | Wt 126.0 lb

## 2019-09-27 DIAGNOSIS — J329 Chronic sinusitis, unspecified: Secondary | ICD-10-CM | POA: Insufficient documentation

## 2019-09-27 DIAGNOSIS — J302 Other seasonal allergic rhinitis: Secondary | ICD-10-CM

## 2019-09-27 DIAGNOSIS — J3089 Other allergic rhinitis: Secondary | ICD-10-CM

## 2019-09-27 DIAGNOSIS — J4520 Mild intermittent asthma, uncomplicated: Secondary | ICD-10-CM

## 2019-09-27 MED ORDER — XHANCE 93 MCG/ACT NA EXHU: 2.0000 | INHALANT_SUSPENSION | Freq: Two times a day (BID) | NASAL | 5 refills | Status: DC

## 2019-09-27 NOTE — Assessment & Plan Note (Signed)
Past history - Noted some respiratory symptoms for the last 4 years but worse the last 10.  Using albuterol the last 2 weeks nightly but it is causing palpitations.  Used to be on Symbicort for a short while in the past. 2021 spirometry showed some restriction with 6% improvement in FEV1 post bronchodilator treatment.  She clinically felt better and liked Xopenex as it did not cause palpitation. Interim history - some chest tightness at night only. Did not use albuterol.   ACT score 23.  Today's spirometry was normal.   Daily controller medication(s):Increase Arnuity 100 to 2 puffs daily and rinse mouth afterwards and see if it helps with your chest tightness.    If the 2 puffs works better then will send in Arnuity dose to use the next time with 1 puff daily.   Prior to physical activity:May use levoalbuterol rescue inhaler 2 puffs 5 to 15 minutes prior to strenuous physical activities.  Rescue medications:May use levoalbuterol rescue inhaler 2 puffs every 4 to 6 hours as needed for shortness of breath, chest tightness, coughing, and wheezing. Monitor frequency of use.

## 2019-09-27 NOTE — Assessment & Plan Note (Signed)
.   See assessment and plan as above. 

## 2019-09-27 NOTE — Patient Instructions (Addendum)
Other allergic rhinitis  Past skin testing showed: Positive to dust mites and cats, trees, mold, cockroach.  May use carbinoxamine 4mg  tablet to 1 and 1/2 tablet twice a day as needed.   You may try to take either Claritin or allegra in the morning - these are less sedating than zyrtec, Xyzal.  Continue environmental control measures.  Take patanase 1 spray per nostril twice a day for drainage.   Stop Nasacort.  Start Xhance 2 sprays per nostril twice a day. Demonstrated proper use and sample given.   This is for the nasal congestion.   Read about allergy injections - handout given.   Reactive airway disease  Today's spirometry was normal.   Daily controller medication(s):Increase Arnuity 100 to 2 puffs daily and rinse mouth afterwards and see if it helps with your chest tightness.    Prior to physical activity:May use levoalbuterol rescue inhaler 2 puffs 5 to 15 minutes prior to strenuous physical activities.  Rescue medications:May use levoalbuterol rescue inhaler 2 puffs every 4 to 6 hours as needed for shortness of breath, chest tightness, coughing, and wheezing. Monitor frequency of use.  Asthma control goals:  Full participation in all desired activities (may need albuterol before activity) Albuterol use two times or less a week on average (not counting use with activity) Cough interfering with sleep two times or less a month Oral steroids no more than once a year No hospitalizations  Follow up in 2 months or sooner if needed.

## 2019-09-27 NOTE — Progress Notes (Signed)
Follow Up Note  RE: Cindy [name] MRN: [id number] DOB: [birth year] Date of Office Visit: 09/27/2019  Referring provider: Mila Palmer, MD Primary care provider: Mila Palmer, MD  Chief Complaint: Asthma and Nasal Congestion  History of Present Illness: I had the pleasure of seeing Cindy [name] for a follow up visit at the [facility] on 09/27/2019. She is a 49 y.o. female, who is being followed for allergic rhinitis and reactive airway disease. Her previous allergy office visit was on 07/28/2019 with Dr. Selena Batten. Today is a regular follow up visit.  Seasonal and perennial allergic rhinitis Currently on patanase 1 spray twice a day and Nasacort 1 spray twice a day with some benefit. No nosebleeds. Still having some nasal congestion mainly at night. Takes carbinoxamine 1 pill twice a day as needed. Tried the 1.5 tablet with no increased benefit.   Reactive airway disease Noted some chest tightness at night at times. Did not use albuterol since last visit. Still taking Arnuity 100 1 puff daily.  Assessment and Plan: Cindy [name] is a 49 y.o. female with: Seasonal and perennial allergic rhinitis Past history - Perennial rhinitis symptoms for the past 40 years but worsening in the spring and winter.  Mood disorder with Zyrtec.  Tried Flonase and Astelin in the past with minimal improvement.  ENT evaluation over 10 years ago.  She has 2 cats at home. 2021 skin testing showed: Positive to dust mites and cats, trees, mold, cockroach. Interim history - carbinoxamine 6mg  did not help. Still having nasal congestion especially at night.  May use carbinoxamine 4mg  tablet to 1 and 1/2 tablet twice a day as needed.   You may try to take either Claritin or allegra in the morning - these are less sedating than zyrtec, Xyzal.  Continue environmental control measures.  Take patanase 1 spray per nostril twice a day for drainage.   Stop Nasacort.  Start Xhance 2 sprays per nostril twice a  day. Demonstrated proper use and sample given.   This is for the nasal congestion.   Read about allergy injections - handout given.   Chronic sinusitis  See assessment and plan as above.  Reactive airway disease Past history - Noted some respiratory symptoms for the last 4 years but worse the last 10.  Using albuterol the last 2 weeks nightly but it is causing palpitations.  Used to be on Symbicort for a short while in the past. 2021 spirometry showed some restriction with 6% improvement in FEV1 post bronchodilator treatment.  She clinically felt better and liked Xopenex as it did not cause palpitation. Interim history - some chest tightness at night only. Did not use albuterol.   ACT score 23.  Today's spirometry was normal.   Daily controller medication(s):Increase Arnuity 100 to 2 puffs daily and rinse mouth afterwards and see if it helps with your chest tightness.    If the 2 puffs works better then will send in Arnuity dose to use the next time with 1 puff daily.   Prior to physical activity:May use levoalbuterol rescue inhaler 2 puffs 5 to 15 minutes prior to strenuous physical activities.  Rescue medications:May use levoalbuterol rescue inhaler 2 puffs every 4 to 6 hours as needed for shortness of breath, chest tightness, coughing, and wheezing. Monitor frequency of use.   Return in about 2 months (around 11/27/2019).  Meds ordered this encounter  Medications  . Fluticasone Propionate (XHANCE) 93 MCG/ACT EXHU    Sig: Place 2 sprays  into the nose in the morning and at bedtime.    Dispense:  32 mL    Refill:  5    [phone] (Mobile)  [phone] (Home Phone)   Diagnostics: Spirometry:  Tracings reviewed. Her effort: Good reproducible efforts. FVC: 2.55L FEV1: 1.87L, 84% predicted FEV1/FVC ratio: 73% Interpretation: Spirometry consistent with normal pattern.  Please see scanned spirometry results for details.  Medication List:  Current Outpatient  Medications  Medication Sig Dispense Refill  . Albuterol Sulfate 108 (90 Base) MCG/ACT AEPB Inhale 2 puffs into the lungs every 4 (four) hours as needed.    . Azelastine-Fluticasone 137-50 MCG/ACT SUSP Place 1 spray into the nose 2 (two) times daily. 23 g 5  . CALCIUM-VITAMIN D PO Take 1 tablet by mouth daily.    . Carbinoxamine Maleate 4 MG TABS Take 1 tablet (4 mg total) by mouth every 8 (eight) hours. 30 tablet 5  . cetirizine (ZYRTEC) 10 MG chewable tablet Chew 10 mg by mouth daily.    . Fluticasone Furoate (ARNUITY ELLIPTA) 100 MCG/ACT AEPB Inhale 1 puff into the lungs daily. 30 each 3  . levalbuterol (XOPENEX HFA) 45 MCG/ACT inhaler Inhale 2 puffs into the lungs every 4 (four) hours as needed for wheezing or shortness of breath (coughing). 1 Inhaler 3  . magnesium 30 MG tablet Take 30 mg by mouth daily.    Marland Kitchen olmesartan (BENICAR) 40 MG tablet Take 40 mg by mouth daily.    . Olopatadine HCl 0.6 % SOLN Place 1 spray into the nose 2 (two) times daily as needed (runny nose). 30.5 g 5  . triamcinolone (NASACORT ALLERGY 24HR) 55 MCG/ACT AERO nasal inhaler Place 1 spray into the nose daily.    . Fluticasone Propionate (XHANCE) 93 MCG/ACT EXHU Place 2 sprays into the nose in the morning and at bedtime. 32 mL 5   No current facility-administered medications for this visit.   Allergies: No Known Allergies I reviewed her past medical history, social history, family history, and environmental history and no significant changes have been reported from her previous visit.  Review of Systems  Constitutional: Negative for appetite change, chills, fever and unexpected weight change.  HENT: Positive for congestion. Negative for postnasal drip and rhinorrhea.   Eyes: Negative for itching.  Respiratory: Negative for cough, chest tightness, shortness of breath and wheezing.   Cardiovascular: Negative for chest pain.  Gastrointestinal: Negative for abdominal pain and nausea.  Genitourinary: Negative for  difficulty urinating.  Skin: Negative for rash.  Allergic/Immunologic: Positive for environmental allergies.  Neurological: Negative for headaches.   Objective: BP 126/80   Pulse 68   Temp 97.6 F (36.4 C) (Temporal)   Resp 18   Ht 5' (1.524 m)   Wt 126 lb (57.2 kg)   SpO2 99%   BMI 24.61 kg/m  Body mass index is 24.61 kg/m. Physical Exam  Constitutional: She is oriented to person, place, and time. She appears well-developed and well-nourished.  HENT:  Head: Normocephalic and atraumatic.  Right Ear: External ear normal.  Left Ear: External ear normal.  Nose: Nose normal.  Mouth/Throat: Oropharynx is clear and moist.  Eyes: Conjunctivae and EOM are normal.  Cardiovascular: Normal rate, regular rhythm and normal heart sounds. Exam reveals no gallop and no friction rub.  No murmur heard. Pulmonary/Chest: Effort normal and breath sounds normal. She has no wheezes. She has no rales.  Abdominal: Soft.  Musculoskeletal:     Cervical back: Neck supple.  Neurological: She is alert and oriented  to person, place, and time.  Skin: Skin is warm. No rash noted.  Psychiatric: She has a normal mood and affect. Her behavior is normal.  Nursing note and vitals reviewed.  Previous notes and tests were reviewed. The plan was reviewed with the patient/family, and all questions/concerned were addressed.  It was my pleasure to see Harman today and participate in her care. Please feel free to contact me with any questions or concerns.  Sincerely,  Rexene Alberts, DO Allergy & Immunology  [facility] office: [phone] [facility] office: [place] office: [phone]

## 2019-09-27 NOTE — Assessment & Plan Note (Signed)
Past history - Perennial rhinitis symptoms for the past 40 years but worsening in the spring and winter.  Mood disorder with Zyrtec.  Tried Flonase and Astelin in the past with minimal improvement.  ENT evaluation over 10 years ago.  She has 2 cats at home. 2021 skin testing showed: Positive to dust mites and cats, trees, mold, cockroach. Interim history - carbinoxamine 6mg  did not help. Still having nasal congestion especially at night.  May use carbinoxamine 4mg  tablet to 1 and 1/2 tablet twice a day as needed.   You may try to take either Claritin or allegra in the morning - these are less sedating than zyrtec, Xyzal.  Continue environmental control measures.  Take patanase 1 spray per nostril twice a day for drainage.   Stop Nasacort.  Start Xhance 2 sprays per nostril twice a day. Demonstrated proper use and sample given.   This is for the nasal congestion.   Read about allergy injections - handout given.

## 2019-10-04 ENCOUNTER — Telehealth: Payer: Self-pay

## 2019-10-04 NOTE — Telephone Encounter (Signed)
Prior authorization submitted on covermymeds for Xhance. Status is currently pending approval/denial.

## 2019-10-05 NOTE — Telephone Encounter (Signed)
Call from [place] at [facility].  They are needing the last clinical note that mentions the need for [facility].  Tried and failed medications.  Please fax to [phone].

## 2019-10-05 NOTE — Telephone Encounter (Signed)
Approval denied. I am currently waiting on appeal letter from [employer] to submit to insurance.

## 2019-10-05 NOTE — Telephone Encounter (Signed)
Everything has been faxed to [employer].

## 2019-10-07 NOTE — Telephone Encounter (Signed)
Appeal submitted to insurance

## 2019-10-14 NOTE — Telephone Encounter (Signed)
Clinical information and appeal letter submitted to insurance.

## 2019-10-19 NOTE — Telephone Encounter (Signed)
Patient is aware of appeal submitted. She is okay with appeal. She will come by and pick up sample today.

## 2019-11-17 MED ORDER — BUDESONIDE 0.5 MG/2ML IN SUSP: RESPIRATORY_TRACT | 2 refills | Status: DC

## 2019-11-17 NOTE — Telephone Encounter (Signed)
Appeal is still pending.

## 2019-11-17 NOTE — Addendum Note (Signed)
Addended by: Ellamae Sia on: 11/17/2019 05:35 PM   Modules accepted: Orders

## 2019-11-17 NOTE — Telephone Encounter (Signed)
Appeal denied. Do you want to switch to Dymista again?

## 2019-11-17 NOTE — Telephone Encounter (Signed)
I'm going to give her budesonide nasal wash.  Place 1 vial with distilled water in neilmed bottle and rinse sinuses twice a day.  Instructions are below.

## 2019-11-18 NOTE — Telephone Encounter (Signed)
Received a decision on appeal after being notified by pharmacy that it was denied for [facility]. [name] is approved until 10-03-2020. I am sending confirmation to pharmacy as well as scan center for chart entry.

## 2019-11-18 NOTE — Telephone Encounter (Signed)
Okay. She is not mixing her own medication. It's just that she had not good results with the typical nasal sprays.  The budesonide saline wash hopefully will be a better delivery method to help with the nasal symptoms.

## 2019-11-18 NOTE — Telephone Encounter (Signed)
Patient notified of medication and was confused since she was at another doctors office as to why she has to mix her own medication. Advise insurance will not cover Xhance.

## 2019-11-18 NOTE — Telephone Encounter (Signed)
Noted and advise patient to give [place] a call when she gets home and we can go over instructions if she is still confused. Patient verbalized understanding.

## 2019-12-01 ENCOUNTER — Other Ambulatory Visit: Payer: Self-pay

## 2019-12-01 ENCOUNTER — Ambulatory Visit (INDEPENDENT_AMBULATORY_CARE_PROVIDER_SITE_OTHER): Payer: No Typology Code available for payment source | Admitting: Allergy

## 2019-12-01 ENCOUNTER — Other Ambulatory Visit: Payer: Self-pay | Admitting: Allergy

## 2019-12-01 ENCOUNTER — Encounter: Payer: Self-pay | Admitting: Allergy

## 2019-12-01 VITALS — BP 108/62 | HR 76 | Temp 98.7°F | Resp 16

## 2019-12-01 DIAGNOSIS — J4520 Mild intermittent asthma, uncomplicated: Secondary | ICD-10-CM | POA: Diagnosis not present

## 2019-12-01 DIAGNOSIS — J329 Chronic sinusitis, unspecified: Secondary | ICD-10-CM | POA: Diagnosis not present

## 2019-12-01 DIAGNOSIS — J302 Other seasonal allergic rhinitis: Secondary | ICD-10-CM | POA: Diagnosis not present

## 2019-12-01 DIAGNOSIS — J3089 Other allergic rhinitis: Secondary | ICD-10-CM

## 2019-12-01 MED ORDER — EPINEPHRINE 0.3 MG/0.3ML IJ SOAJ: 0.3000 mg | INTRAMUSCULAR | 2 refills | Status: DC | PRN

## 2019-12-01 NOTE — Progress Notes (Signed)
Follow Up Note  RE: Cindy [name] MRN: [id number] DOB: [birth year] Date of Office Visit: 12/01/2019  Referring provider: Mila Palmer, MD Primary care provider: Mila Palmer, MD  Chief Complaint: Allergic Rhinitis  and Asthma  History of Present Illness: I had the pleasure of seeing Cindy [name] for a follow up visit at the [facility] on 12/01/2019. She is a 49 y.o. female, who is being followed for allergic rhinitis, reactive airway disease. Her previous allergy office visit was on 09/27/2019 with Dr. Selena Batten. Today is a regular follow up visit.  Seasonal and perennial allergic rhinitis Still having nasal congestion. Currently using Xhance 2 sprays twice a day with unknown benefit and her insurance will not cover anymore.  Taking carbinoxamine 1.5 tablet at night. Has 2 cats at home and interested in starting allergy injections.  Reactive airway disease Patient stopped Arnuity in June and had no issues with her breathing.  Denies any SOB, coughing, wheezing, chest tightness, nocturnal awakenings, ER/urgent care visits or prednisone use since the last visit.  Assessment and Plan: Joell is a 49 y.o. female with: Seasonal and perennial allergic rhinitis Past history - Perennial rhinitis symptoms for the past 40 years but worsening in the spring and winter.  Mood disorder with Zyrtec.  Tried Flonase and Astelin in the past with minimal improvement.  ENT evaluation over 10 years ago.  2 cats at home. 2021 skin testing showed: Positive to dust mites and cats, trees, mold, cockroach. Interim history - Xhance not effective. Still having nasal congestion. Can't get rid off cats as they are therapy for her kids.  Start allergy injections. Discussed risks and benefits.  Consent was signed and epinephrine injectable device sent in.   May use carbinoxamine 4mg  tablet to 1 and 1/2 tablet twice a day as needed.   You may try to take either Claritin or allegra in the morning -  these are less sedating than zyrtec, Xyzal.  Continue environmental control measures.  May take patanase 1 spray per nostril twice a day for drainage.   Once finished with Xhance then restart Nasacort 1 spray twice a day for nasal congestion.  If no improvement in congestion, recommend ENT referral next.   If you want to do the saline rinse with budesonide let know and I will send in the prescription.   Declines Singulair.   Reactive airway disease Past history - Noted some respiratory symptoms for the last 4 years but worse the last 10.  Using albuterol the last 2 weeks nightly but it is causing palpitations.  Used to be on Symbicort for a short while in the past. 2021 spirometry showed some restriction with 6% improvement in FEV1 post bronchodilator treatment.  She clinically felt better and liked Xopenex as it did not cause palpitation. Interim history - stopped Arnuity in June with no worsening symptoms.  ACT score 24.  Today's spirometry was normal.   May use albuterol rescue inhaler 2 puffs every 4 to 6 hours as needed for shortness of breath, chest tightness, coughing, and wheezing. May use albuterol rescue inhaler 2 puffs 5 to 15 minutes prior to strenuous physical activities. Monitor frequency of use.   Stop Arnuity for now.  Return in about 4 months (around 04/02/2020).  Meds ordered this encounter  Medications  . EPINEPHrine 0.3 mg/0.3 mL IJ SOAJ injection    Sig: Inject 0.3 mLs (0.3 mg total) into the muscle as needed for anaphylaxis.    Dispense:  1  each    Refill:  2    May dispense generic/teva/mylan brand.   Diagnostics: Spirometry:  Tracings reviewed. Her effort: Good reproducible efforts. FVC: 2.45L FEV1: 1.85L, 83% predicted FEV1/FVC ratio: 76% Interpretation: Spirometry consistent with normal pattern.  Please see scanned spirometry results for details.  Medication List:  Current Outpatient Medications  Medication Sig Dispense Refill  .  CALCIUM-VITAMIN D PO Take 1 tablet by mouth daily.    . Carbinoxamine Maleate 4 MG TABS Take 1 tablet (4 mg total) by mouth every 8 (eight) hours. 30 tablet 5  . cetirizine (ZYRTEC) 10 MG chewable tablet Chew 10 mg by mouth daily.    . Fluticasone Propionate (XHANCE) 93 MCG/ACT EXHU Place 2 sprays into the nose in the morning and at bedtime. 32 mL 5  . levalbuterol (XOPENEX HFA) 45 MCG/ACT inhaler Inhale 2 puffs into the lungs every 4 (four) hours as needed for wheezing or shortness of breath (coughing). 1 Inhaler 3  . magnesium 30 MG tablet Take 30 mg by mouth daily.    Marland Kitchen olmesartan (BENICAR) 40 MG tablet Take 40 mg by mouth daily.    . Olopatadine HCl 0.6 % SOLN Place 1 spray into the nose 2 (two) times daily as needed (runny nose). 30.5 g 5  . triamcinolone (NASACORT ALLERGY 24HR) 55 MCG/ACT AERO nasal inhaler Place 1 spray into the nose daily.    Marland Kitchen EPINEPHrine 0.3 mg/0.3 mL IJ SOAJ injection Inject 0.3 mLs (0.3 mg total) into the muscle as needed for anaphylaxis. 1 each 2   No current facility-administered medications for this visit.   Allergies: No Known Allergies I reviewed her past medical history, social history, family history, and environmental history and no significant changes have been reported from her previous visit.  Review of Systems  Constitutional: Negative for appetite change, chills, fever and unexpected weight change.  HENT: Positive for congestion. Negative for postnasal drip and rhinorrhea.   Eyes: Negative for itching.  Respiratory: Negative for cough, chest tightness, shortness of breath and wheezing.   Cardiovascular: Negative for chest pain.  Gastrointestinal: Negative for abdominal pain and nausea.  Genitourinary: Negative for difficulty urinating.  Skin: Negative for rash.  Allergic/Immunologic: Positive for environmental allergies.  Neurological: Negative for headaches.   Objective: BP 108/62   Pulse 76   Temp 98.7 F (37.1 C) (Temporal)   Resp 16    SpO2 97%  There is no height or weight on file to calculate BMI. Physical Exam Vitals and nursing note reviewed.  Constitutional:      Appearance: She is well-developed.  HENT:     Head: Normocephalic and atraumatic.     Right Ear: External ear normal.     Left Ear: External ear normal.     Nose: Nose normal.  Eyes:     Conjunctiva/sclera: Conjunctivae normal.  Cardiovascular:     Rate and Rhythm: Normal rate and regular rhythm.     Heart sounds: Normal heart sounds. No murmur heard.  No friction rub. No gallop.   Pulmonary:     Effort: Pulmonary effort is normal.     Breath sounds: Normal breath sounds. No wheezing or rales.  Abdominal:     Palpations: Abdomen is soft.  Musculoskeletal:     Cervical back: Neck supple.  Skin:    General: Skin is warm.     Findings: No rash.  Neurological:     Mental Status: She is alert and oriented to person, place, and time.  Psychiatric:  Behavior: Behavior normal.    Previous notes and tests were reviewed. The plan was reviewed with the patient/family, and all questions/concerned were addressed.  It was my pleasure to see Bruchy today and participate in her care. Please feel free to contact me with any questions or concerns.  Sincerely,  Wyline Mood, DO Allergy & Immunology  [facility] office: [phone] [facility] office: [phone] [place] office: [phone]

## 2019-12-01 NOTE — Patient Instructions (Signed)
Other allergic rhinitis  Past skin testing showed: Positive to dust mites and cats, trees, mold, cockroach.  Start allergy injections.   Consent was signed.   May use carbinoxamine 4mg  tablet to 1 and 1/2 tablet twice a day as needed.   You may try to take either Claritin or allegra in the morning - these are less sedating than zyrtec, Xyzal.  Continue environmental control measures.  May take patanase 1 spray per nostril twice a day for drainage.   Once you run out of Xhance then restart Nasacort 1 spray twice a day for nasal congestion.  If you want a referral to ENT let know.  If you want to do the saline rinse with budesonide let us know and I will send in the prescription.   Reactive airway disease  May use albuterol rescue inhaler 2 puffs every 4 to 6 hours as needed for shortness of breath, chest tightness, coughing, and wheezing. May use albuterol rescue inhaler 2 puffs 5 to 15 minutes prior to strenuous physical activities. Monitor frequency of use.  Asthma control goals:  Full participation in all desired activities (may need albuterol before activity) Albuterol use two times or less a week on average (not counting use with activity) Cough interfering with sleep two times or less a month Oral steroids no more than once a year No hospitalizations  Follow up in 4 months or sooner if needed.  Follow up in 3 weeks for first injection.

## 2019-12-01 NOTE — Assessment & Plan Note (Signed)
Past history - Perennial rhinitis symptoms for the past 40 years but worsening in the spring and winter.  Mood disorder with Zyrtec.  Tried Flonase and Astelin in the past with minimal improvement.  ENT evaluation over 10 years ago.  2 cats at home. 2021 skin testing showed: Positive to dust mites and cats, trees, mold, cockroach. Interim history - Xhance not effective. Still having nasal congestion. Can't get rid off cats as they are therapy for her kids.  Start allergy injections. Discussed risks and benefits.  Consent was signed and epinephrine injectable device sent in.   May use carbinoxamine 4mg  tablet to 1 and 1/2 tablet twice a day as needed.   You may try to take either Claritin or allegra in the morning - these are less sedating than zyrtec, Xyzal.  Continue environmental control measures.  May take patanase 1 spray per nostril twice a day for drainage.   Once finished with Xhance then restart Nasacort 1 spray twice a day for nasal congestion.  If no improvement in congestion, recommend ENT referral next.   If you want to do the saline rinse with budesonide let know and I will send in the prescription.   Declines Singulair.

## 2019-12-01 NOTE — Progress Notes (Signed)
VIALS EXP 11-30-20 °

## 2019-12-01 NOTE — Assessment & Plan Note (Signed)
Past history - Noted some respiratory symptoms for the last 4 years but worse the last 10.  Using albuterol the last 2 weeks nightly but it is causing palpitations.  Used to be on Symbicort for a short while in the past. 2021 spirometry showed some restriction with 6% improvement in FEV1 post bronchodilator treatment.  She clinically felt better and liked Xopenex as it did not cause palpitation. Interim history - stopped Arnuity in June with no worsening symptoms.  ACT score 24.  Today's spirometry was normal.   May use albuterol rescue inhaler 2 puffs every 4 to 6 hours as needed for shortness of breath, chest tightness, coughing, and wheezing. May use albuterol rescue inhaler 2 puffs 5 to 15 minutes prior to strenuous physical activities. Monitor frequency of use.   Stop Arnuity for now.

## 2019-12-02 DIAGNOSIS — J3089 Other allergic rhinitis: Secondary | ICD-10-CM

## 2019-12-06 DIAGNOSIS — J301 Allergic rhinitis due to pollen: Secondary | ICD-10-CM | POA: Diagnosis not present

## 2019-12-23 ENCOUNTER — Ambulatory Visit (INDEPENDENT_AMBULATORY_CARE_PROVIDER_SITE_OTHER): Payer: No Typology Code available for payment source

## 2019-12-23 ENCOUNTER — Other Ambulatory Visit: Payer: Self-pay

## 2019-12-23 ENCOUNTER — Ambulatory Visit: Payer: No Typology Code available for payment source

## 2019-12-23 DIAGNOSIS — J3089 Other allergic rhinitis: Secondary | ICD-10-CM

## 2019-12-23 DIAGNOSIS — J309 Allergic rhinitis, unspecified: Secondary | ICD-10-CM

## 2019-12-23 NOTE — Progress Notes (Signed)
Immunotherapy   Patient Details  Name: Cindy [name] MRN: [id number] Date of Birth: 06-Jul-1970  12/23/2019  Launa Flight started injections for  T-C-DM & M-CR Following schedule: B  Frequency:1 time per week Epi-Pen:Epi-Pen Available   Patient waited 30 minutes in office post injection with no local reaction or systemic symptoms.  Consent signed and patient instructions given.   Dorathy Daft I Milliani Herrada 12/23/2019, 10:45 AM

## 2019-12-30 ENCOUNTER — Ambulatory Visit (INDEPENDENT_AMBULATORY_CARE_PROVIDER_SITE_OTHER): Payer: No Typology Code available for payment source

## 2019-12-30 DIAGNOSIS — J309 Allergic rhinitis, unspecified: Secondary | ICD-10-CM | POA: Diagnosis not present

## 2019-12-30 DIAGNOSIS — J302 Other seasonal allergic rhinitis: Secondary | ICD-10-CM

## 2020-01-06 ENCOUNTER — Ambulatory Visit (INDEPENDENT_AMBULATORY_CARE_PROVIDER_SITE_OTHER): Payer: No Typology Code available for payment source

## 2020-01-06 DIAGNOSIS — J309 Allergic rhinitis, unspecified: Secondary | ICD-10-CM | POA: Diagnosis not present

## 2020-01-06 DIAGNOSIS — J302 Other seasonal allergic rhinitis: Secondary | ICD-10-CM

## 2020-01-06 DIAGNOSIS — J3089 Other allergic rhinitis: Secondary | ICD-10-CM

## 2020-01-13 ENCOUNTER — Ambulatory Visit (INDEPENDENT_AMBULATORY_CARE_PROVIDER_SITE_OTHER): Payer: No Typology Code available for payment source

## 2020-01-13 DIAGNOSIS — J3089 Other allergic rhinitis: Secondary | ICD-10-CM

## 2020-01-13 DIAGNOSIS — J302 Other seasonal allergic rhinitis: Secondary | ICD-10-CM

## 2020-01-20 ENCOUNTER — Ambulatory Visit (INDEPENDENT_AMBULATORY_CARE_PROVIDER_SITE_OTHER): Payer: No Typology Code available for payment source

## 2020-01-20 DIAGNOSIS — J302 Other seasonal allergic rhinitis: Secondary | ICD-10-CM

## 2020-01-20 DIAGNOSIS — J309 Allergic rhinitis, unspecified: Secondary | ICD-10-CM

## 2020-01-27 ENCOUNTER — Ambulatory Visit (INDEPENDENT_AMBULATORY_CARE_PROVIDER_SITE_OTHER): Payer: No Typology Code available for payment source

## 2020-01-27 DIAGNOSIS — J309 Allergic rhinitis, unspecified: Secondary | ICD-10-CM | POA: Diagnosis not present

## 2020-02-03 ENCOUNTER — Ambulatory Visit (INDEPENDENT_AMBULATORY_CARE_PROVIDER_SITE_OTHER): Payer: No Typology Code available for payment source

## 2020-02-03 DIAGNOSIS — J309 Allergic rhinitis, unspecified: Secondary | ICD-10-CM | POA: Diagnosis not present

## 2020-02-10 ENCOUNTER — Ambulatory Visit (INDEPENDENT_AMBULATORY_CARE_PROVIDER_SITE_OTHER): Payer: No Typology Code available for payment source | Admitting: *Deleted

## 2020-02-10 DIAGNOSIS — J309 Allergic rhinitis, unspecified: Secondary | ICD-10-CM | POA: Diagnosis not present

## 2020-02-17 ENCOUNTER — Ambulatory Visit (INDEPENDENT_AMBULATORY_CARE_PROVIDER_SITE_OTHER): Payer: No Typology Code available for payment source

## 2020-02-17 DIAGNOSIS — J309 Allergic rhinitis, unspecified: Secondary | ICD-10-CM

## 2020-02-24 ENCOUNTER — Ambulatory Visit (INDEPENDENT_AMBULATORY_CARE_PROVIDER_SITE_OTHER): Payer: No Typology Code available for payment source

## 2020-02-24 DIAGNOSIS — J309 Allergic rhinitis, unspecified: Secondary | ICD-10-CM | POA: Diagnosis not present

## 2020-03-02 ENCOUNTER — Ambulatory Visit (INDEPENDENT_AMBULATORY_CARE_PROVIDER_SITE_OTHER): Payer: No Typology Code available for payment source

## 2020-03-02 DIAGNOSIS — J309 Allergic rhinitis, unspecified: Secondary | ICD-10-CM | POA: Diagnosis not present

## 2020-03-09 ENCOUNTER — Ambulatory Visit (INDEPENDENT_AMBULATORY_CARE_PROVIDER_SITE_OTHER): Payer: No Typology Code available for payment source

## 2020-03-09 DIAGNOSIS — J309 Allergic rhinitis, unspecified: Secondary | ICD-10-CM | POA: Diagnosis not present

## 2020-03-16 ENCOUNTER — Ambulatory Visit (INDEPENDENT_AMBULATORY_CARE_PROVIDER_SITE_OTHER): Payer: No Typology Code available for payment source | Admitting: *Deleted

## 2020-03-16 DIAGNOSIS — J309 Allergic rhinitis, unspecified: Secondary | ICD-10-CM | POA: Diagnosis not present

## 2020-03-23 ENCOUNTER — Ambulatory Visit (INDEPENDENT_AMBULATORY_CARE_PROVIDER_SITE_OTHER): Payer: No Typology Code available for payment source | Admitting: *Deleted

## 2020-03-23 DIAGNOSIS — J309 Allergic rhinitis, unspecified: Secondary | ICD-10-CM | POA: Diagnosis not present

## 2020-03-30 ENCOUNTER — Ambulatory Visit (INDEPENDENT_AMBULATORY_CARE_PROVIDER_SITE_OTHER): Payer: No Typology Code available for payment source

## 2020-03-30 DIAGNOSIS — J309 Allergic rhinitis, unspecified: Secondary | ICD-10-CM

## 2020-04-03 ENCOUNTER — Encounter: Payer: Self-pay | Admitting: Allergy

## 2020-04-03 ENCOUNTER — Ambulatory Visit (INDEPENDENT_AMBULATORY_CARE_PROVIDER_SITE_OTHER): Payer: No Typology Code available for payment source | Admitting: Allergy

## 2020-04-03 ENCOUNTER — Other Ambulatory Visit: Payer: Self-pay

## 2020-04-03 VITALS — BP 120/76 | HR 66 | Temp 98.5°F | Resp 18 | Ht 59.84 in | Wt 129.4 lb

## 2020-04-03 DIAGNOSIS — J4520 Mild intermittent asthma, uncomplicated: Secondary | ICD-10-CM

## 2020-04-03 DIAGNOSIS — J302 Other seasonal allergic rhinitis: Secondary | ICD-10-CM

## 2020-04-03 DIAGNOSIS — J3089 Other allergic rhinitis: Secondary | ICD-10-CM | POA: Diagnosis not present

## 2020-04-03 MED ORDER — CARBINOXAMINE MALEATE 4 MG PO TABS: ORAL_TABLET | ORAL | 2 refills | Status: DC

## 2020-04-03 MED ORDER — XHANCE 93 MCG/ACT NA EXHU: 1.0000 | INHALANT_SUSPENSION | Freq: Two times a day (BID) | NASAL | 5 refills | Status: DC

## 2020-04-03 NOTE — Assessment & Plan Note (Signed)
Past history - Noted some respiratory symptoms for the last 4 years but worse the last 10.  Using albuterol the last 2 weeks nightly but it is causing palpitations.  Used to be on Symbicort for a short while in the past. 2021 spirometry showed some restriction with 6% improvement in FEV1 post bronchodilator treatment.  She clinically felt better and liked Xopenex as it did not cause palpitation. Interim history - stable with no albuterol use.  ACT score 25.  May use levoalbuterol rescue inhaler 2 puffs every 4 to 6 hours as needed for shortness of breath, chest tightness, coughing, and wheezing. May use levoalbuterol rescue inhaler 2 puffs 5 to 15 minutes prior to strenuous physical activities. Monitor frequency of use.

## 2020-04-03 NOTE — Progress Notes (Signed)
Follow Up Note  RE: Cindy [name] MRN: [id number] DOB: [birth year] Date of Office Visit: 04/03/2020  Referring provider: Mila Palmer, MD Primary care provider: Mila Palmer, MD  Chief Complaint: Allergic Rhinitis , Asthma, and Nasal Congestion  History of Present Illness: I had the pleasure of seeing Cindy [name] for a follow up visit at the [facility] on 04/03/2020. She is a 49 y.o. female, who is being followed for allergic rhinitis on AIT and reactive airway disease. Her previous allergy office visit was on 12/01/2019 with Dr. Selena Batten. Today is a regular follow up visit.  Seasonal and perennial allergic rhinitis  Started allergy injections and having some localized pruritus. Currently taking carbinoxamine 4mg  tablet 1 to 1.5 tablet at night. Using saline spray and Nasacort as needed. Nasacort seems to cause some nosebleeds. No epistaxis with Xhance.  Not needed to use patanase or eye drops.  Reactive airway disease Denies any SOB, coughing, wheezing, chest tightness, nocturnal awakenings, ER/urgent care visits or prednisone use since the last visit. No albuterol use since the last visit.   Assessment and Plan: Cindy [name] is a 49 y.o. female with: Seasonal and perennial allergic rhinitis Past history - Perennial rhinitis symptoms for the past 40 years but worsening in the spring and winter.  Mood disorder with Zyrtec.  Tried Flonase and Astelin in the past with minimal improvement.  ENT evaluation over 10 years ago.  2 cats at home. 2021 skin testing showed: Positive to dust mites and cats, trees, mold, cockroach. Declines Singulair.  Interim history - started AIT on 12/22/2019 (TCDM & MCR) with some localized pruritus. Nasacort causing epistaxis.   Continue allergy injections - given today.  Continue environmental control measures  May use carbinoxamine 4mg  1.5 tablet twice a day as needed.   You may try to take either Claritin or allegra in the morning - these are  less sedating than zyrtec, Xyzal.  May take patanase 1 spray per nostril twice a day for drainage.   Will send in prescription for [facility] again.   Meanwhile use Nasacort 1 spray per nostril 1-2 times a day for nasal congestion. Demonstrated proper use.   Reactive airway disease Past history - Noted some respiratory symptoms for the last 4 years but worse the last 10.  Using albuterol the last 2 weeks nightly but it is causing palpitations.  Used to be on Symbicort for a short while in the past. 2021 spirometry showed some restriction with 6% improvement in FEV1 post bronchodilator treatment.  She clinically felt better and liked Xopenex as it did not cause palpitation. Interim history - stable with no albuterol use.  ACT score 25.  May use levoalbuterol rescue inhaler 2 puffs every 4 to 6 hours as needed for shortness of breath, chest tightness, coughing, and wheezing. May use levoalbuterol rescue inhaler 2 puffs 5 to 15 minutes prior to strenuous physical activities. Monitor frequency of use.   Return in about 6 months (around 10/01/2020).  Meds ordered this encounter  Medications  . Fluticasone Propionate (XHANCE) 93 MCG/ACT EXHU    Sig: Place 1-2 sprays into the nose in the morning and at bedtime.    Dispense:  32 mL    Refill:  5    [phone], Dx:J30.89, tried/fail: Nasacort, Flonase, Azelastine  . Carbinoxamine Maleate 4 MG TABS    Sig: Take 1.5 tablet twice a day as needed for allergies.    Dispense:  270 tablet    Refill:  2  Diagnostics: None.  Medication List:  Current Outpatient Medications  Medication Sig Dispense Refill  . CALCIUM-VITAMIN D PO Take 1 tablet by mouth daily.    Marland Kitchen EPINEPHrine 0.3 mg/0.3 mL IJ SOAJ injection Inject 0.3 mLs (0.3 mg total) into the muscle as needed for anaphylaxis. 1 each 2  . levalbuterol (XOPENEX HFA) 45 MCG/ACT inhaler Inhale 2 puffs into the lungs every 4 (four) hours as needed for wheezing or shortness of breath (coughing). 1  Inhaler 3  . magnesium 30 MG tablet Take 30 mg by mouth daily.    Marland Kitchen olmesartan (BENICAR) 40 MG tablet Take 40 mg by mouth daily.    . Olopatadine HCl 0.6 % SOLN Place 1 spray into the nose 2 (two) times daily as needed (runny nose). 30.5 g 5  . triamcinolone (NASACORT ALLERGY 24HR) 55 MCG/ACT AERO nasal inhaler Place 1 spray into the nose daily.    . Carbinoxamine Maleate 4 MG TABS Take 1.5 tablet twice a day as needed for allergies. 270 tablet 2  . cetirizine (ZYRTEC) 10 MG chewable tablet Chew 10 mg by mouth daily. (Patient not taking: Reported on 04/03/2020)    . Fluticasone Propionate (XHANCE) 93 MCG/ACT EXHU Place 1-2 sprays into the nose in the morning and at bedtime. 32 mL 5   No current facility-administered medications for this visit.   Allergies: No Known Allergies I reviewed her past medical history, social history, family history, and environmental history and no significant changes have been reported from her previous visit.  Review of Systems  Constitutional: Negative for appetite change, chills, fever and unexpected weight change.  HENT: Negative for congestion, postnasal drip and rhinorrhea.   Eyes: Negative for itching.  Respiratory: Negative for cough, chest tightness, shortness of breath and wheezing.   Cardiovascular: Negative for chest pain.  Gastrointestinal: Negative for abdominal pain and nausea.  Genitourinary: Negative for difficulty urinating.  Skin: Negative for rash.  Allergic/Immunologic: Positive for environmental allergies.  Neurological: Negative for headaches.   Objective: BP 120/76   Pulse 66   Temp 98.5 F (36.9 C) (Temporal)   Resp 18   Ht 4' 11.84" (1.52 m)   Wt 129 lb 6.4 oz (58.7 kg)   SpO2 99%   BMI 25.40 kg/m  Body mass index is 25.4 kg/m. Physical Exam Vitals and nursing note reviewed.  Constitutional:      Appearance: Normal appearance. She is well-developed.  HENT:     Head: Normocephalic and atraumatic.     Right Ear: Tympanic  membrane and external ear normal.     Left Ear: Tympanic membrane and external ear normal.     Nose: Nose normal.     Mouth/Throat:     Mouth: Mucous membranes are moist.     Pharynx: Oropharynx is clear.  Eyes:     Conjunctiva/sclera: Conjunctivae normal.  Cardiovascular:     Rate and Rhythm: Normal rate and regular rhythm.     Heart sounds: Normal heart sounds. No murmur heard.  No friction rub. No gallop.   Pulmonary:     Effort: Pulmonary effort is normal.     Breath sounds: Normal breath sounds. No wheezing or rales.  Musculoskeletal:     Cervical back: Neck supple.  Skin:    General: Skin is warm.     Findings: No rash.  Neurological:     Mental Status: She is alert and oriented to person, place, and time.  Psychiatric:        Behavior: Behavior normal.  Previous notes and tests were reviewed. The plan was reviewed with the patient/family, and all questions/concerned were addressed.  It was my pleasure to see Cindy [name] today and participate in her care. Please feel free to contact me with any questions or concerns.  Sincerely,  Wyline Mood, DO Allergy & Immunology  [facility] office: [phone] [facility] office: [phone]

## 2020-04-03 NOTE — Patient Instructions (Addendum)
Other allergic rhinitis  Past skin testing showed: Positive to dust mites and cats, trees, mold, cockroach.  Continue allergy injections.   Continue environmental control measures  May use carbinoxamine 4mg  1.5 tablet twice a day as needed.   You may try to take either Claritin or allegra in the morning - these are less sedating than zyrtec, Xyzal.  May take patanase 1 spray per nostril twice a day for drainage.   Will send in prescription for [facility] again.  Meanwhile use Nasacort 1 spray per nostril 1-2 times a day for nasal congestion.   Nose Bleeds: Nosebleeds are very common.  Site of the bleeding is typically on the septum or at the very front of the nose.  Some of the more common causes are from trauma, inflammation or medication induced. Pinch both nostrils while leaning forward for at least 5 minutes before checking to see if the bleeding has stopped. If bleeding is not controlled within 5-10 minutes apply a cotton ball soaked with oxymetazoline (Afrin) to the bleeding nostril for a few seconds.  Preventative treatment: Apply saline nasal gel in each nostril twice a day for 2 weeks to allow the nasal mucosa to heal Consider using a humidifier in the winter Try to keep your blood pressure as normal as possible (120/80)  Reactive airway disease  May use levoalbuterol rescue inhaler 2 puffs every 4 to 6 hours as needed for shortness of breath, chest tightness, coughing, and wheezing. May use levoalbuterol rescue inhaler 2 puffs 5 to 15 minutes prior to strenuous physical activities. Monitor frequency of use.   Breathing  control goals:  Full participation in all desired activities (may need albuterol before activity) Albuterol use two times or less a week on average (not counting use with activity) Cough interfering with sleep two times or less a month Oral steroids no more than once a year No hospitalizations  Follow up in 6 months or sooner if needed.

## 2020-04-03 NOTE — Assessment & Plan Note (Addendum)
Past history - Perennial rhinitis symptoms for the past 40 years but worsening in the spring and winter.  Mood disorder with Zyrtec.  Tried Flonase and Astelin in the past with minimal improvement.  ENT evaluation over 10 years ago.  2 cats at home. 2021 skin testing showed: Positive to dust mites and cats, trees, mold, cockroach. Declines Singulair.  Interim history - started AIT on 12/22/2019 (TCDM & MCR) with some localized pruritus. Nasacort causing epistaxis.   Continue allergy injections - given today.  Continue environmental control measures  May use carbinoxamine 4mg  1.5 tablet twice a day as needed.   You may try to take either Claritin or allegra in the morning - these are less sedating than zyrtec, Xyzal.  May take patanase 1 spray per nostril twice a day for drainage.   Will send in prescription for [facility] again.   Meanwhile use Nasacort 1 spray per nostril 1-2 times a day for nasal congestion. Demonstrated proper use.

## 2020-04-10 ENCOUNTER — Ambulatory Visit (INDEPENDENT_AMBULATORY_CARE_PROVIDER_SITE_OTHER): Payer: No Typology Code available for payment source | Admitting: *Deleted

## 2020-04-10 DIAGNOSIS — J309 Allergic rhinitis, unspecified: Secondary | ICD-10-CM | POA: Diagnosis not present

## 2020-04-24 ENCOUNTER — Ambulatory Visit (INDEPENDENT_AMBULATORY_CARE_PROVIDER_SITE_OTHER): Payer: No Typology Code available for payment source

## 2020-04-24 DIAGNOSIS — J309 Allergic rhinitis, unspecified: Secondary | ICD-10-CM

## 2020-05-01 ENCOUNTER — Ambulatory Visit (INDEPENDENT_AMBULATORY_CARE_PROVIDER_SITE_OTHER): Payer: No Typology Code available for payment source | Admitting: *Deleted

## 2020-05-01 DIAGNOSIS — J309 Allergic rhinitis, unspecified: Secondary | ICD-10-CM | POA: Diagnosis not present

## 2020-05-08 ENCOUNTER — Ambulatory Visit (INDEPENDENT_AMBULATORY_CARE_PROVIDER_SITE_OTHER): Payer: No Typology Code available for payment source | Admitting: *Deleted

## 2020-05-08 DIAGNOSIS — J309 Allergic rhinitis, unspecified: Secondary | ICD-10-CM | POA: Diagnosis not present

## 2020-05-15 ENCOUNTER — Ambulatory Visit (INDEPENDENT_AMBULATORY_CARE_PROVIDER_SITE_OTHER): Payer: No Typology Code available for payment source

## 2020-05-15 DIAGNOSIS — J309 Allergic rhinitis, unspecified: Secondary | ICD-10-CM

## 2020-05-23 ENCOUNTER — Ambulatory Visit (INDEPENDENT_AMBULATORY_CARE_PROVIDER_SITE_OTHER): Payer: No Typology Code available for payment source | Admitting: *Deleted

## 2020-05-23 DIAGNOSIS — J309 Allergic rhinitis, unspecified: Secondary | ICD-10-CM | POA: Diagnosis not present

## 2020-05-29 ENCOUNTER — Ambulatory Visit (INDEPENDENT_AMBULATORY_CARE_PROVIDER_SITE_OTHER): Payer: No Typology Code available for payment source | Admitting: *Deleted

## 2020-05-29 DIAGNOSIS — J309 Allergic rhinitis, unspecified: Secondary | ICD-10-CM | POA: Diagnosis not present

## 2020-06-05 ENCOUNTER — Ambulatory Visit (INDEPENDENT_AMBULATORY_CARE_PROVIDER_SITE_OTHER): Payer: No Typology Code available for payment source

## 2020-06-05 DIAGNOSIS — J309 Allergic rhinitis, unspecified: Secondary | ICD-10-CM | POA: Diagnosis not present

## 2020-06-13 ENCOUNTER — Ambulatory Visit (INDEPENDENT_AMBULATORY_CARE_PROVIDER_SITE_OTHER): Payer: No Typology Code available for payment source

## 2020-06-13 DIAGNOSIS — J309 Allergic rhinitis, unspecified: Secondary | ICD-10-CM

## 2020-06-19 ENCOUNTER — Ambulatory Visit (INDEPENDENT_AMBULATORY_CARE_PROVIDER_SITE_OTHER): Payer: No Typology Code available for payment source

## 2020-06-19 DIAGNOSIS — J309 Allergic rhinitis, unspecified: Secondary | ICD-10-CM | POA: Diagnosis not present

## 2020-06-26 ENCOUNTER — Ambulatory Visit (INDEPENDENT_AMBULATORY_CARE_PROVIDER_SITE_OTHER): Payer: No Typology Code available for payment source

## 2020-06-26 DIAGNOSIS — J309 Allergic rhinitis, unspecified: Secondary | ICD-10-CM | POA: Diagnosis not present

## 2020-07-03 ENCOUNTER — Ambulatory Visit (INDEPENDENT_AMBULATORY_CARE_PROVIDER_SITE_OTHER): Payer: No Typology Code available for payment source | Admitting: *Deleted

## 2020-07-03 DIAGNOSIS — J309 Allergic rhinitis, unspecified: Secondary | ICD-10-CM

## 2020-07-10 ENCOUNTER — Ambulatory Visit (INDEPENDENT_AMBULATORY_CARE_PROVIDER_SITE_OTHER): Payer: No Typology Code available for payment source | Admitting: *Deleted

## 2020-07-10 DIAGNOSIS — J309 Allergic rhinitis, unspecified: Secondary | ICD-10-CM | POA: Diagnosis not present

## 2020-07-10 NOTE — Progress Notes (Signed)
VIALS EXP 07-10-21 

## 2020-07-11 DIAGNOSIS — J3089 Other allergic rhinitis: Secondary | ICD-10-CM | POA: Diagnosis not present

## 2020-07-17 ENCOUNTER — Ambulatory Visit (INDEPENDENT_AMBULATORY_CARE_PROVIDER_SITE_OTHER): Payer: No Typology Code available for payment source

## 2020-07-17 DIAGNOSIS — J309 Allergic rhinitis, unspecified: Secondary | ICD-10-CM

## 2020-07-24 ENCOUNTER — Ambulatory Visit (INDEPENDENT_AMBULATORY_CARE_PROVIDER_SITE_OTHER): Payer: No Typology Code available for payment source | Admitting: *Deleted

## 2020-07-24 DIAGNOSIS — J309 Allergic rhinitis, unspecified: Secondary | ICD-10-CM

## 2020-07-31 ENCOUNTER — Ambulatory Visit (INDEPENDENT_AMBULATORY_CARE_PROVIDER_SITE_OTHER): Payer: No Typology Code available for payment source | Admitting: *Deleted

## 2020-07-31 DIAGNOSIS — J309 Allergic rhinitis, unspecified: Secondary | ICD-10-CM | POA: Diagnosis not present

## 2020-08-07 ENCOUNTER — Ambulatory Visit (INDEPENDENT_AMBULATORY_CARE_PROVIDER_SITE_OTHER): Payer: No Typology Code available for payment source

## 2020-08-07 DIAGNOSIS — J309 Allergic rhinitis, unspecified: Secondary | ICD-10-CM | POA: Diagnosis not present

## 2020-08-14 ENCOUNTER — Ambulatory Visit (INDEPENDENT_AMBULATORY_CARE_PROVIDER_SITE_OTHER): Payer: No Typology Code available for payment source

## 2020-08-14 DIAGNOSIS — J309 Allergic rhinitis, unspecified: Secondary | ICD-10-CM

## 2020-08-21 ENCOUNTER — Ambulatory Visit (INDEPENDENT_AMBULATORY_CARE_PROVIDER_SITE_OTHER): Payer: No Typology Code available for payment source

## 2020-08-21 DIAGNOSIS — J309 Allergic rhinitis, unspecified: Secondary | ICD-10-CM | POA: Diagnosis not present

## 2020-08-28 ENCOUNTER — Ambulatory Visit (INDEPENDENT_AMBULATORY_CARE_PROVIDER_SITE_OTHER): Payer: No Typology Code available for payment source

## 2020-08-28 DIAGNOSIS — J309 Allergic rhinitis, unspecified: Secondary | ICD-10-CM | POA: Diagnosis not present

## 2020-08-29 ENCOUNTER — Other Ambulatory Visit: Payer: Self-pay | Admitting: Neurology

## 2020-08-29 ENCOUNTER — Other Ambulatory Visit: Payer: Self-pay | Admitting: Family Medicine

## 2020-08-29 DIAGNOSIS — Z1231 Encounter for screening mammogram for malignant neoplasm of breast: Secondary | ICD-10-CM

## 2020-09-04 ENCOUNTER — Ambulatory Visit (INDEPENDENT_AMBULATORY_CARE_PROVIDER_SITE_OTHER): Payer: No Typology Code available for payment source

## 2020-09-04 DIAGNOSIS — J309 Allergic rhinitis, unspecified: Secondary | ICD-10-CM | POA: Diagnosis not present

## 2020-09-05 ENCOUNTER — Other Ambulatory Visit (HOSPITAL_COMMUNITY)
Admission: RE | Admit: 2020-09-05 | Discharge: 2020-09-05 | Disposition: A | Discharge status: Home / Self Care | Payer: No Typology Code available for payment source | Source: Ambulatory Visit | Attending: Family Medicine | Admitting: Family Medicine

## 2020-09-05 ENCOUNTER — Other Ambulatory Visit: Payer: Self-pay | Admitting: Family Medicine

## 2020-09-05 DIAGNOSIS — Z01411 Encounter for gynecological examination (general) (routine) with abnormal findings: Secondary | ICD-10-CM | POA: Diagnosis not present

## 2020-09-07 LAB — CYTOLOGY - PAP
Comment: NEGATIVE
Diagnosis: NEGATIVE
High risk HPV: NEGATIVE

## 2020-09-11 ENCOUNTER — Ambulatory Visit (INDEPENDENT_AMBULATORY_CARE_PROVIDER_SITE_OTHER): Payer: No Typology Code available for payment source | Admitting: *Deleted

## 2020-09-11 DIAGNOSIS — J309 Allergic rhinitis, unspecified: Secondary | ICD-10-CM | POA: Diagnosis not present

## 2020-09-18 ENCOUNTER — Ambulatory Visit (INDEPENDENT_AMBULATORY_CARE_PROVIDER_SITE_OTHER): Payer: No Typology Code available for payment source | Admitting: *Deleted

## 2020-09-18 DIAGNOSIS — J309 Allergic rhinitis, unspecified: Secondary | ICD-10-CM | POA: Diagnosis not present

## 2020-09-25 ENCOUNTER — Ambulatory Visit (INDEPENDENT_AMBULATORY_CARE_PROVIDER_SITE_OTHER): Payer: No Typology Code available for payment source

## 2020-09-25 DIAGNOSIS — J309 Allergic rhinitis, unspecified: Secondary | ICD-10-CM

## 2020-10-01 NOTE — Progress Notes (Signed)
Follow Up Note  RE: Cindy [name] MRN: [id number] DOB: 03/08/1971 Date of Office Visit: 10/02/2020  Referring provider: Mila Palmer, MD Primary care provider: Mila Palmer, MD  Chief Complaint: Asthma and Allergic Rhinitis   History of Present Illness: I had the pleasure of seeing Cindy [name] for a follow up visit at the [facility] on 10/02/2020. She is a 50 y.o. female, who is being followed for allergic rhinitis on AIT and reactive airway disease. Her previous allergy office visit was on 04/03/2020 with Dr. Selena [name]. Today is a regular follow up visit.  Seasonal and perennial allergic rhinitis Having some localized itching with the allergy injections but tolerating it well.  Taking carbinoxamine 4mg  1 to 1.5 tablet at night with good benefit. Using Nasacort 1 spray per nostril at night - no nosebleeds now but had some in the winter. Not needed to use patanase.   Reactive airway disease ACT score 25.  Denies any SOB, coughing, wheezing, chest tightness, nocturnal awakenings, ER/urgent care visits or prednisone use since the last visit.  Assessment and Plan: Cindy [name] is a 50 y.o. female with: Reactive airway disease Past history - Albuterol causes palpitations.  Used to be on Symbicort for a short while in the past. 2021 spirometry showed some restriction with 6% improvement in FEV1 post bronchodilator treatment.  She clinically felt better and liked Xopenex as it did not cause palpitation. Interim history - Well-controlled with no inhaler use.   ACT score 25.  Today's spirometry was normal.  May use levoalbuterol rescue inhaler 2 puffs every 4 to 6 hours as needed for shortness of breath, chest tightness, coughing, and wheezing. May use levoalbuterol rescue inhaler 2 puffs 5 to 15 minutes prior to strenuous physical activities. Monitor frequency of use.   Seasonal and perennial allergic rhinitis Past history - Perennial rhinitis symptoms for the past 40 years but  worsening in the spring and winter.  Mood disorder with Zyrtec.  Tried Flonase and Astelin in the past with minimal improvement.  ENT evaluation over 10 years ago.  2 cats at home. 2021 skin testing showed: Positive to dust mites and cats, trees, mold, cockroach. Declines Singulair. Started AIT on 12/22/2019 (TCDM & MCR). Interim history - doing well with below regimen.  Continue allergy injections - given today.   Continue environmental control measures  May use carbinoxamine 4mg  1-1.5 tablet 1-2 times a day as needed.   May use Nasacort 1 spray per nostril 1-2 times a day for nasal congestion.   Return in about 6 months (around 04/04/2021).  No orders of the defined types were placed in this encounter.  Lab Orders  No laboratory test(s) ordered today    Diagnostics: Spirometry:  Tracings reviewed. Her effort: Good reproducible efforts. FVC: 2.24L FEV1: 1.67L, 76% predicted FEV1/FVC ratio: 75% Interpretation: Spirometry consistent with normal pattern.  Please see scanned spirometry results for details.  Medication List:  Current Outpatient Medications  Medication Sig Dispense Refill  . albuterol (VENTOLIN HFA) 108 (90 Base) MCG/ACT inhaler 2 puffs as needed    . Carbinoxamine Maleate 4 MG TABS Take 1.5 tablet twice a day as needed for allergies. 270 tablet 2  . EPINEPHrine 0.3 mg/0.3 mL IJ SOAJ injection Inject 0.3 mLs (0.3 mg total) into the muscle as needed for anaphylaxis. 1 each 2  . olmesartan (BENICAR) 40 MG tablet Take 40 mg by mouth daily.    triamcinolone (NASACORT) 55 MCG/ACT AERO nasal inhaler Place 1 spray into the  nose daily.    Marland Kitchen CALCIUM-VITAMIN D PO Take 1 tablet by mouth daily. (Patient not taking: No sig reported)    . levalbuterol (XOPENEX HFA) 45 MCG/ACT inhaler Inhale 2 puffs into the lungs every 4 (four) hours as needed for wheezing or shortness of breath (coughing). (Patient not taking: Reported on 10/02/2020) 1 Inhaler 3  . magnesium 30 MG tablet Take 30 mg  by mouth daily. (Patient not taking: Reported on 10/02/2020)     No current facility-administered medications for this visit.   Allergies: Allergies  Allergen Reactions  . Iron Other (See Comments)  . Minocycline Other (See Comments)  . Other Other (See Comments)   I reviewed her past medical history, social history, family history, and environmental history and no significant changes have been reported from her previous visit.  Review of Systems  Constitutional: Negative for appetite change, chills, fever and unexpected weight change.  HENT: Negative for congestion, postnasal drip and rhinorrhea.   Eyes: Negative for itching.  Respiratory: Negative for cough, chest tightness, shortness of breath and wheezing.   Cardiovascular: Negative for chest pain.  Gastrointestinal: Negative for abdominal pain and nausea.  Genitourinary: Negative for difficulty urinating.  Skin: Negative for rash.  Allergic/Immunologic: Positive for environmental allergies.  Neurological: Negative for headaches.   Objective: BP 118/70   Pulse 69   Temp 98.6 F (37 C) (Temporal)   Resp 17   SpO2 98%  There is no height or weight on file to calculate BMI. Physical Exam Vitals and nursing note reviewed.  Constitutional:      Appearance: Normal appearance. She is well-developed.  HENT:     Head: Normocephalic and atraumatic.     Right Ear: Tympanic membrane and external ear normal.     Left Ear: Tympanic membrane and external ear normal.     Nose: Nose normal.     Mouth/Throat:     Mouth: Mucous membranes are moist.     Pharynx: Oropharynx is clear.  Eyes:     Conjunctiva/sclera: Conjunctivae normal.  Cardiovascular:     Rate and Rhythm: Normal rate and regular rhythm.     Heart sounds: Normal heart sounds. No murmur heard. No friction rub. No gallop.   Pulmonary:     Effort: Pulmonary effort is normal.     Breath sounds: Normal breath sounds. No wheezing or rales.  Musculoskeletal:     Cervical  back: Neck supple.  Skin:    General: Skin is warm.     Findings: No rash.  Neurological:     Mental Status: She is alert and oriented to person, place, and time.  Psychiatric:        Behavior: Behavior normal.    Previous notes and tests were reviewed. The plan was reviewed with the patient/family, and all questions/concerned were addressed.  It was my pleasure to see Cindy [name] today and participate in her care. Please feel free to contact me with any questions or concerns.  Sincerely,  Wyline Mood, DO Allergy & Immunology  [facility] office: [phone] [facility] office: [phone]

## 2020-10-02 ENCOUNTER — Other Ambulatory Visit: Payer: Self-pay

## 2020-10-02 ENCOUNTER — Encounter: Payer: Self-pay | Admitting: Allergy

## 2020-10-02 ENCOUNTER — Ambulatory Visit: Payer: Self-pay | Admitting: *Deleted

## 2020-10-02 ENCOUNTER — Ambulatory Visit: Payer: No Typology Code available for payment source | Admitting: Allergy

## 2020-10-02 VITALS — BP 118/70 | HR 69 | Temp 98.6°F | Resp 17

## 2020-10-02 DIAGNOSIS — J302 Other seasonal allergic rhinitis: Secondary | ICD-10-CM

## 2020-10-02 DIAGNOSIS — J309 Allergic rhinitis, unspecified: Secondary | ICD-10-CM

## 2020-10-02 DIAGNOSIS — J4520 Mild intermittent asthma, uncomplicated: Secondary | ICD-10-CM

## 2020-10-02 NOTE — Assessment & Plan Note (Signed)
Past history - Perennial rhinitis symptoms for the past 40 years but worsening in the spring and winter.  Mood disorder with Zyrtec.  Tried Flonase and Astelin in the past with minimal improvement.  ENT evaluation over 10 years ago.  2 cats at home. 2021 skin testing showed: Positive to dust mites and cats, trees, mold, cockroach. Declines Singulair. Started AIT on 12/22/2019 (TCDM & MCR). Interim history - doing well with below regimen.  Continue allergy injections - given today.   Continue environmental control measures  May use carbinoxamine 4mg  1-1.5 tablet 1-2 times a day as needed.   May use Nasacort 1 spray per nostril 1-2 times a day for nasal congestion.

## 2020-10-02 NOTE — Patient Instructions (Addendum)
Other allergic rhinitis  Past skin testing showed: Positive to dust mites and cats, trees, mold, cockroach.  Continue allergy injections - given today.   Continue environmental control measures  May use carbinoxamine 4mg  1 to 1.5 tablet 1-2 times a day as needed.   May use Nasacort 1 spray per nostril 1-2 times a day for nasal congestion.   Reactive airway disease  May use levoalbuterol rescue inhaler 2 puffs every 4 to 6 hours as needed for shortness of breath, chest tightness, coughing, and wheezing. May use levoalbuterol rescue inhaler 2 puffs 5 to 15 minutes prior to strenuous physical activities. Monitor frequency of use.   Breathing  control goals:  Full participation in all desired activities (may need albuterol before activity) Albuterol use two times or less a week on average (not counting use with activity) Cough interfering with sleep two times or less a month Oral steroids no more than once a year No hospitalizations  Follow up in 6 months or sooner if needed.

## 2020-10-02 NOTE — Assessment & Plan Note (Signed)
Past history - Albuterol causes palpitations.  Used to be on Symbicort for a short while in the past. 2021 spirometry showed some restriction with 6% improvement in FEV1 post bronchodilator treatment.  She clinically felt better and liked Xopenex as it did not cause palpitation. Interim history - Well-controlled with no inhaler use.   ACT score 25.  Today's spirometry was normal.  May use levoalbuterol rescue inhaler 2 puffs every 4 to 6 hours as needed for shortness of breath, chest tightness, coughing, and wheezing. May use levoalbuterol rescue inhaler 2 puffs 5 to 15 minutes prior to strenuous physical activities. Monitor frequency of use.

## 2020-10-09 ENCOUNTER — Ambulatory Visit (INDEPENDENT_AMBULATORY_CARE_PROVIDER_SITE_OTHER): Payer: No Typology Code available for payment source | Admitting: *Deleted

## 2020-10-09 DIAGNOSIS — J309 Allergic rhinitis, unspecified: Secondary | ICD-10-CM | POA: Diagnosis not present

## 2020-10-10 DIAGNOSIS — J3089 Other allergic rhinitis: Secondary | ICD-10-CM | POA: Diagnosis not present

## 2020-10-10 NOTE — Progress Notes (Signed)
VIALS EXP 10-10-21 

## 2020-10-16 ENCOUNTER — Ambulatory Visit (INDEPENDENT_AMBULATORY_CARE_PROVIDER_SITE_OTHER): Payer: No Typology Code available for payment source

## 2020-10-16 DIAGNOSIS — J309 Allergic rhinitis, unspecified: Secondary | ICD-10-CM

## 2020-10-19 ENCOUNTER — Other Ambulatory Visit: Payer: Self-pay

## 2020-10-19 ENCOUNTER — Ambulatory Visit
Admission: RE | Admit: 2020-10-19 | Discharge: 2020-10-19 | Disposition: A | Discharge status: Home / Self Care | Payer: No Typology Code available for payment source | Source: Ambulatory Visit | Attending: Family Medicine | Admitting: Family Medicine

## 2020-10-19 DIAGNOSIS — Z1231 Encounter for screening mammogram for malignant neoplasm of breast: Secondary | ICD-10-CM

## 2020-10-24 ENCOUNTER — Ambulatory Visit (INDEPENDENT_AMBULATORY_CARE_PROVIDER_SITE_OTHER): Payer: No Typology Code available for payment source | Admitting: *Deleted

## 2020-10-24 DIAGNOSIS — J309 Allergic rhinitis, unspecified: Secondary | ICD-10-CM | POA: Diagnosis not present

## 2020-10-30 ENCOUNTER — Ambulatory Visit (INDEPENDENT_AMBULATORY_CARE_PROVIDER_SITE_OTHER): Payer: No Typology Code available for payment source

## 2020-10-30 DIAGNOSIS — J309 Allergic rhinitis, unspecified: Secondary | ICD-10-CM

## 2020-11-06 ENCOUNTER — Ambulatory Visit (INDEPENDENT_AMBULATORY_CARE_PROVIDER_SITE_OTHER): Payer: No Typology Code available for payment source

## 2020-11-06 DIAGNOSIS — J309 Allergic rhinitis, unspecified: Secondary | ICD-10-CM

## 2020-11-15 ENCOUNTER — Ambulatory Visit (INDEPENDENT_AMBULATORY_CARE_PROVIDER_SITE_OTHER): Payer: No Typology Code available for payment source

## 2020-11-15 DIAGNOSIS — J309 Allergic rhinitis, unspecified: Secondary | ICD-10-CM

## 2020-11-20 ENCOUNTER — Ambulatory Visit (INDEPENDENT_AMBULATORY_CARE_PROVIDER_SITE_OTHER): Payer: No Typology Code available for payment source

## 2020-11-20 DIAGNOSIS — J309 Allergic rhinitis, unspecified: Secondary | ICD-10-CM

## 2020-11-28 ENCOUNTER — Ambulatory Visit (INDEPENDENT_AMBULATORY_CARE_PROVIDER_SITE_OTHER): Payer: No Typology Code available for payment source | Admitting: *Deleted

## 2020-11-28 DIAGNOSIS — J309 Allergic rhinitis, unspecified: Secondary | ICD-10-CM

## 2020-12-05 ENCOUNTER — Ambulatory Visit (INDEPENDENT_AMBULATORY_CARE_PROVIDER_SITE_OTHER): Payer: No Typology Code available for payment source | Admitting: *Deleted

## 2020-12-05 DIAGNOSIS — J309 Allergic rhinitis, unspecified: Secondary | ICD-10-CM | POA: Diagnosis not present

## 2020-12-11 ENCOUNTER — Ambulatory Visit (INDEPENDENT_AMBULATORY_CARE_PROVIDER_SITE_OTHER): Payer: No Typology Code available for payment source | Admitting: *Deleted

## 2020-12-11 DIAGNOSIS — J309 Allergic rhinitis, unspecified: Secondary | ICD-10-CM

## 2020-12-25 ENCOUNTER — Ambulatory Visit (INDEPENDENT_AMBULATORY_CARE_PROVIDER_SITE_OTHER): Payer: No Typology Code available for payment source

## 2020-12-25 DIAGNOSIS — J309 Allergic rhinitis, unspecified: Secondary | ICD-10-CM | POA: Diagnosis not present

## 2021-01-08 ENCOUNTER — Ambulatory Visit (INDEPENDENT_AMBULATORY_CARE_PROVIDER_SITE_OTHER): Payer: No Typology Code available for payment source

## 2021-01-08 DIAGNOSIS — J309 Allergic rhinitis, unspecified: Secondary | ICD-10-CM | POA: Diagnosis not present

## 2021-01-16 DIAGNOSIS — J3089 Other allergic rhinitis: Secondary | ICD-10-CM

## 2021-01-16 NOTE — Progress Notes (Signed)
VIALS MADE. EXP 01-16-22 

## 2021-01-22 ENCOUNTER — Ambulatory Visit (INDEPENDENT_AMBULATORY_CARE_PROVIDER_SITE_OTHER): Payer: No Typology Code available for payment source

## 2021-01-22 DIAGNOSIS — J309 Allergic rhinitis, unspecified: Secondary | ICD-10-CM | POA: Diagnosis not present

## 2021-02-05 ENCOUNTER — Ambulatory Visit (INDEPENDENT_AMBULATORY_CARE_PROVIDER_SITE_OTHER): Payer: No Typology Code available for payment source | Admitting: *Deleted

## 2021-02-05 DIAGNOSIS — J309 Allergic rhinitis, unspecified: Secondary | ICD-10-CM

## 2021-02-19 ENCOUNTER — Ambulatory Visit (INDEPENDENT_AMBULATORY_CARE_PROVIDER_SITE_OTHER): Payer: No Typology Code available for payment source

## 2021-02-19 DIAGNOSIS — J309 Allergic rhinitis, unspecified: Secondary | ICD-10-CM | POA: Diagnosis not present

## 2021-03-05 ENCOUNTER — Ambulatory Visit (INDEPENDENT_AMBULATORY_CARE_PROVIDER_SITE_OTHER): Payer: No Typology Code available for payment source

## 2021-03-05 DIAGNOSIS — J309 Allergic rhinitis, unspecified: Secondary | ICD-10-CM | POA: Diagnosis not present

## 2021-03-27 ENCOUNTER — Ambulatory Visit (INDEPENDENT_AMBULATORY_CARE_PROVIDER_SITE_OTHER): Payer: No Typology Code available for payment source | Admitting: *Deleted

## 2021-03-27 DIAGNOSIS — J309 Allergic rhinitis, unspecified: Secondary | ICD-10-CM | POA: Diagnosis not present

## 2021-04-02 ENCOUNTER — Ambulatory Visit (INDEPENDENT_AMBULATORY_CARE_PROVIDER_SITE_OTHER): Payer: No Typology Code available for payment source | Admitting: *Deleted

## 2021-04-02 DIAGNOSIS — J309 Allergic rhinitis, unspecified: Secondary | ICD-10-CM | POA: Diagnosis not present

## 2021-04-04 ENCOUNTER — Other Ambulatory Visit: Payer: Self-pay

## 2021-04-04 ENCOUNTER — Encounter: Payer: Self-pay | Admitting: Allergy

## 2021-04-04 ENCOUNTER — Ambulatory Visit: Payer: No Typology Code available for payment source | Admitting: Allergy

## 2021-04-04 VITALS — BP 124/72 | HR 72 | Temp 98.3°F | Resp 16

## 2021-04-04 DIAGNOSIS — J3089 Other allergic rhinitis: Secondary | ICD-10-CM

## 2021-04-04 DIAGNOSIS — J4520 Mild intermittent asthma, uncomplicated: Secondary | ICD-10-CM

## 2021-04-04 DIAGNOSIS — J302 Other seasonal allergic rhinitis: Secondary | ICD-10-CM

## 2021-04-04 MED ORDER — LEVALBUTEROL TARTRATE 45 MCG/ACT IN AERO: 2.0000 | INHALATION_SPRAY | RESPIRATORY_TRACT | 1 refills | Status: DC | PRN

## 2021-04-04 NOTE — Progress Notes (Signed)
Follow Up Note  RE: Cindy [name] MRN: [id number] DOB: November 21, 1970 Date of Office Visit: 04/04/2021  Referring provider: Mila Palmer, MD Primary care provider: Mila Palmer, MD  Chief Complaint: Follow-up  History of Present Illness: I had the pleasure of seeing Cindy [name] for a follow up visit at the [facility] on 04/04/2021. She is a 50 y.o. female, who is being followed for reactive airway disease and allergic rhinitis on AIT. Her previous allergy office visit was on 10/02/2020 with Dr. Selena Batten. Today is a regular follow up visit.  Reactive airway disease/allergic rhinitis Used xopenex once since the last visit.  Denies any SOB, coughing, chest tightness, nocturnal awakenings, ER/urgent care visits or prednisone use since the last visit.  Doing well with allergy injections and thinks it has improved her symptoms a little bit.  She noted some wheezing after last Monday's injection. This resolved by the next day. This does not happen after every injection but noted a few episodes of wheezing at night. No recent URI or Covid-19 infection.   Currently taking carbinoxamine 4mg  1 tablet night and increase to 1.5 tablet during the wheezing episodes. Using Nasacort 1 spray per nostril once a day. No nosebleeds.   Assessment and Plan: Cindy [name] is a 50 y.o. female with: Reactive airway disease Past history - Albuterol causes palpitations.  Used to be on Symbicort for a short while in the past. 2021 spirometry showed some restriction with 6% improvement in FEV1 post bronchodilator treatment.  She clinically felt better and liked Xopenex as it did not cause palpitation. Interim history - noted wheezing at night sometimes, especially on the days of her injections. Did not try to use her rescue inhaler as symptoms resolve by the next day.  Today's spirometry showed some mild obstruction. Start Alvesco 2022 2 puffs twice a day and rinse mouth after each use x 2 weeks. Sample  given.  During upper respiratory infections/flares: start Arnuity 1 puff once a day OR Alvesco 2 puffs twice a day and rinse mouth after each use for 1-2 weeks until your breathing symptoms return to baseline.  May use levoalbuterol rescue inhaler 2 puffs every 4 to 6 hours as needed for shortness of breath, chest tightness, coughing, and wheezing. May use levoalbuterol rescue inhaler 2 puffs 5 to 15 minutes prior to strenuous physical activities. Monitor frequency of use.  Get spirometry at next visit.  Seasonal and perennial allergic rhinitis Past history - Perennial rhinitis symptoms for the past 40 years but worsening in the spring and winter.  Mood disorder with Zyrtec.  Tried Flonase and Astelin in the past with minimal improvement.  ENT evaluation over 10 years ago.  2 cats at home. 2021 skin testing showed: Positive to dust mites and cats, trees, mold, cockroach. Declines Singulair. Started AIT on 12/22/2019 (TCDM & MCR). Interim history - noted some wheezing at times after injections at night which resolve by the next day with no inhaler. This does not happen after every injection.  Continue allergy injections. Let me know if you have worsening of the wheezing after the injections.  Make sure you carry your Epipen on the days of your injections.  Continue environmental control measures May use carbinoxamine 4mg  1 to 1.5 tablet 1-2 times a day as needed.  May use Nasacort 1 spray per nostril 1-2 times a day for nasal congestion.   Return in about 4 months (around 08/02/2021).  Meds ordered this encounter  Medications  levalbuterol (XOPENEX HFA) 45 MCG/ACT inhaler    Sig: Inhale 2 puffs into the lungs every 4 (four) hours as needed for wheezing or shortness of breath (coughing).    Dispense:  1 each    Refill:  1    Lab Orders  No laboratory test(s) ordered today    Diagnostics: Spirometry:  Tracings reviewed. Her effort: Good reproducible efforts. FVC: 2.26L FEV1:  1.59L, 79% predicted FEV1/FVC ratio: 70% Interpretation: Spirometry consistent with mild obstructive disease.  Please see scanned spirometry results for details.  Medication List:  Current Outpatient Medications  Medication Sig Dispense Refill   albuterol (VENTOLIN HFA) 108 (90 Base) MCG/ACT inhaler 2 puffs as needed     Carbinoxamine Maleate 4 MG TABS Take 1.5 tablet twice a day as needed for allergies. 270 tablet 2   EPINEPHrine 0.3 mg/0.3 mL IJ SOAJ injection Inject 0.3 mLs (0.3 mg total) into the muscle as needed for anaphylaxis. 1 each 2   olmesartan (BENICAR) 40 MG tablet Take 40 mg by mouth daily.     triamcinolone (NASACORT) 55 MCG/ACT AERO nasal inhaler Place 1 spray into the nose daily.     CALCIUM-VITAMIN D PO Take 1 tablet by mouth daily. (Patient not taking: No sig reported)     levalbuterol (XOPENEX HFA) 45 MCG/ACT inhaler Inhale 2 puffs into the lungs every 4 (four) hours as needed for wheezing or shortness of breath (coughing). 1 each 1   magnesium 30 MG tablet Take 30 mg by mouth daily. (Patient not taking: No sig reported)     No current facility-administered medications for this visit.   Allergies: Allergies  Allergen Reactions   Iron Other (See Comments)   Minocycline Other (See Comments)   Other Other (See Comments)   I reviewed her past medical history, social history, family history, and environmental history and no significant changes have been reported from her previous visit.  Review of Systems  Constitutional:  Negative for appetite change, chills, fever and unexpected weight change.  HENT:  Negative for congestion, postnasal drip and rhinorrhea.   Eyes:  Negative for itching.  Respiratory:  Positive for wheezing. Negative for cough, chest tightness and shortness of breath.   Cardiovascular:  Negative for chest pain.  Gastrointestinal:  Negative for abdominal pain and nausea.  Genitourinary:  Negative for difficulty urinating.  Skin:  Negative for rash.   Allergic/Immunologic: Positive for environmental allergies.  Neurological:  Negative for headaches.   Objective: BP 124/72   Pulse 72   Temp 98.3 F (36.8 C) (Temporal)   Resp 16   SpO2 98%  There is no height or weight on file to calculate BMI. Physical Exam Vitals and nursing note reviewed.  Constitutional:      Appearance: Normal appearance. She is well-developed.  HENT:     Head: Normocephalic and atraumatic.     Right Ear: Tympanic membrane and external ear normal.     Left Ear: Tympanic membrane and external ear normal.     Nose: Nose normal.     Mouth/Throat:     Mouth: Mucous membranes are moist.     Pharynx: Oropharynx is clear.  Eyes:     Conjunctiva/sclera: Conjunctivae normal.  Cardiovascular:     Rate and Rhythm: Normal rate and regular rhythm.     Heart sounds: Normal heart sounds. No murmur heard.   No friction rub. No gallop.  Pulmonary:     Effort: Pulmonary effort is normal.     Breath sounds: Normal breath sounds.  No wheezing or rales.  Musculoskeletal:     Cervical back: Neck supple.  Skin:    General: Skin is warm.     Findings: No rash.  Neurological:     Mental Status: She is alert and oriented to person, place, and time.  Psychiatric:        Behavior: Behavior normal.   Previous notes and tests were reviewed. The plan was reviewed with the patient/family, and all questions/concerned were addressed.  It was my pleasure to see Cindy [name] today and participate in her care. Please feel free to contact me with any questions or concerns.  Sincerely,  Wyline Mood, DO Allergy & Immunology  [facility] office: [phone] [facility] office: [phone]

## 2021-04-04 NOTE — Assessment & Plan Note (Signed)
Past history - Perennial rhinitis symptoms for the past 40 years but worsening in the spring and winter.  Mood disorder with Zyrtec.  Tried Flonase and Astelin in the past with minimal improvement.  ENT evaluation over 10 years ago.  2 cats at home. 2021 skin testing showed: Positive to dust mites and cats, trees, mold, cockroach. Declines Singulair. Started AIT on 12/22/2019 (TCDM & MCR). Interim history - noted some wheezing at times after injections at night which resolve by the next day with no inhaler. This does not happen after every injection.   Continue allergy injections.  Let me know if you have worsening of the wheezing after the injections.   Make sure you carry your Epipen on the days of your injections.   Continue environmental control measures  May use carbinoxamine 4mg  1 to 1.5 tablet 1-2 times a day as needed.   May use Nasacort 1 spray per nostril 1-2 times a day for nasal congestion.

## 2021-04-04 NOTE — Patient Instructions (Addendum)
Other allergic rhinitis Past skin testing showed: Positive to dust mites and cats, trees, mold, cockroach. Continue allergy injections. Let me know if you have worsening of the wheezing after the injections.  Make sure you carry your Epipen on the days of your injections.  Continue environmental control measures May use carbinoxamine 4mg  1 to 1.5 tablet 1-2 times a day as needed.  May use Nasacort 1 spray per nostril 1-2 times a day for nasal congestion.   Reactive airway disease Start Alvesco 2 puffs twice a day and rinse mouth after each use. Sample given.  During upper respiratory infections/flares: start Arnuity 1 puff once a day OR Alvesco 2 puffs twice a day and rinse mouth after each use for 1-2 weeks until your breathing symptoms return to baseline.  May use levoalbuterol rescue inhaler 2 puffs every 4 to 6 hours as needed for shortness of breath, chest tightness, coughing, and wheezing. May use levoalbuterol rescue inhaler 2 puffs 5 to 15 minutes prior to strenuous physical activities. Monitor frequency of use.  Breathing  control goals:  Full participation in all desired activities (may need albuterol before activity) Albuterol use two times or less a week on average (not counting use with activity) Cough interfering with sleep two times or less a month Oral steroids no more than once a year No hospitalizations  Follow up in 4 months or sooner if needed.

## 2021-04-04 NOTE — Assessment & Plan Note (Addendum)
Past history - Albuterol causes palpitations.  Used to be on Symbicort for a short while in the past. 2021 spirometry showed some restriction with 6% improvement in FEV1 post bronchodilator treatment.  She clinically felt better and liked Xopenex as it did not cause palpitation. Interim history - noted wheezing at night sometimes, especially on the days of her injections. Did not try to use her rescue inhaler as symptoms resolve by the next day.   Today's spirometry showed some mild obstruction.  Start Alvesco 2 puffs twice a day and rinse mouth after each use x 2 weeks. Sample given.   During upper respiratory infections/flares: start Arnuity 1 puff once a day OR Alvesco 2 puffs twice a day and rinse mouth after each use for 1-2 weeks until your breathing symptoms return to baseline.   May use levoalbuterol rescue inhaler 2 puffs every 4 to 6 hours as needed for shortness of breath, chest tightness, coughing, and wheezing. May use levoalbuterol rescue inhaler 2 puffs 5 to 15 minutes prior to strenuous physical activities. Monitor frequency of use.   Get spirometry at next visit.

## 2021-04-09 ENCOUNTER — Ambulatory Visit (INDEPENDENT_AMBULATORY_CARE_PROVIDER_SITE_OTHER): Payer: No Typology Code available for payment source

## 2021-04-09 DIAGNOSIS — J309 Allergic rhinitis, unspecified: Secondary | ICD-10-CM | POA: Diagnosis not present

## 2021-04-16 ENCOUNTER — Ambulatory Visit (INDEPENDENT_AMBULATORY_CARE_PROVIDER_SITE_OTHER): Payer: No Typology Code available for payment source | Admitting: *Deleted

## 2021-04-16 ENCOUNTER — Telehealth: Payer: Self-pay | Admitting: Allergy

## 2021-04-16 DIAGNOSIS — J309 Allergic rhinitis, unspecified: Secondary | ICD-10-CM | POA: Diagnosis not present

## 2021-04-16 MED ORDER — ARNUITY ELLIPTA 100 MCG/ACT IN AEPB: 1.0000 | INHALATION_SPRAY | Freq: Every day | RESPIRATORY_TRACT | 5 refills | Status: DC

## 2021-04-16 NOTE — Telephone Encounter (Signed)
Patient called back stating she wants to do Arnuity Ellipta as she doesn't think her insurance will pay for the Alvesco.   [facility]

## 2021-04-16 NOTE — Telephone Encounter (Signed)
Please advise for medication change to Arnuity Ellipta. Patient's insurance will not cover Alvesco.

## 2021-04-16 NOTE — Telephone Encounter (Signed)
Patient said thank you for sending in the Arnuity. She only pays $15 for the Arnuity vs. paying $50 for Alvesco.

## 2021-04-16 NOTE — Telephone Encounter (Signed)
Please call patient.  Sent in Arnuity.  Even if insurance doesn't cover Alvesco - it has a [employer] account planner of $50 per month for the inhaler.

## 2021-04-16 NOTE — Telephone Encounter (Signed)
Cindy [name] states she was given a sample of Alvesco.  Cindy [name] states she likes it and would like a prescription called in to [facility] on [address] [name]

## 2021-04-26 ENCOUNTER — Ambulatory Visit (INDEPENDENT_AMBULATORY_CARE_PROVIDER_SITE_OTHER): Payer: No Typology Code available for payment source | Admitting: *Deleted

## 2021-04-26 DIAGNOSIS — J309 Allergic rhinitis, unspecified: Secondary | ICD-10-CM | POA: Diagnosis not present

## 2021-05-07 ENCOUNTER — Ambulatory Visit (INDEPENDENT_AMBULATORY_CARE_PROVIDER_SITE_OTHER): Payer: No Typology Code available for payment source

## 2021-05-07 DIAGNOSIS — J309 Allergic rhinitis, unspecified: Secondary | ICD-10-CM

## 2021-05-08 ENCOUNTER — Other Ambulatory Visit: Payer: Self-pay | Admitting: Allergy

## 2021-05-23 ENCOUNTER — Ambulatory Visit (INDEPENDENT_AMBULATORY_CARE_PROVIDER_SITE_OTHER): Payer: No Typology Code available for payment source

## 2021-05-23 DIAGNOSIS — J309 Allergic rhinitis, unspecified: Secondary | ICD-10-CM

## 2021-05-31 ENCOUNTER — Ambulatory Visit: Payer: No Typology Code available for payment source | Attending: Internal Medicine

## 2021-05-31 ENCOUNTER — Other Ambulatory Visit (HOSPITAL_BASED_OUTPATIENT_CLINIC_OR_DEPARTMENT_OTHER): Payer: Self-pay

## 2021-05-31 DIAGNOSIS — Z23 Encounter for immunization: Secondary | ICD-10-CM

## 2021-05-31 MED ORDER — PFIZER COVID-19 VAC BIVALENT 30 MCG/0.3ML IM SUSP
INTRAMUSCULAR | 0 refills | Status: DC
  Filled 2021-05-31: qty 0.3, 1d supply, fill #0

## 2021-05-31 NOTE — Progress Notes (Signed)
° °  Covid-19 Vaccination Clinic  Name:  Cindy [name]    MRN: [id number] DOB: 11-16-1970  05/31/2021  [name] was observed post Covid-19 immunization for 15 minutes without incident. She was provided with Vaccine Information Sheet and instruction to access the V-Safe system.   [name] was instructed to call 911 with any severe reactions post vaccine: Difficulty breathing  Swelling of face and throat  A fast heartbeat  A bad rash all over body  Dizziness and weakness   Immunizations Administered     Name Date Dose VIS Date Route   Pfizer Covid-19 Vaccine Bivalent Booster 05/31/2021 11:15 AM 0.3 mL 01/24/2021 Intramuscular   Manufacturer: [employer], [employer]   Lot: [id number]   [facility]: [phone]

## 2021-06-04 ENCOUNTER — Ambulatory Visit (INDEPENDENT_AMBULATORY_CARE_PROVIDER_SITE_OTHER): Payer: No Typology Code available for payment source | Admitting: *Deleted

## 2021-06-04 DIAGNOSIS — J309 Allergic rhinitis, unspecified: Secondary | ICD-10-CM | POA: Diagnosis not present

## 2021-06-04 NOTE — Progress Notes (Signed)
VIALS MADE. EXP 06-04-22 °

## 2021-06-05 DIAGNOSIS — J3089 Other allergic rhinitis: Secondary | ICD-10-CM | POA: Diagnosis not present

## 2021-06-18 ENCOUNTER — Ambulatory Visit (INDEPENDENT_AMBULATORY_CARE_PROVIDER_SITE_OTHER): Payer: No Typology Code available for payment source

## 2021-06-18 DIAGNOSIS — J309 Allergic rhinitis, unspecified: Secondary | ICD-10-CM | POA: Diagnosis not present

## 2021-07-02 ENCOUNTER — Ambulatory Visit (INDEPENDENT_AMBULATORY_CARE_PROVIDER_SITE_OTHER): Payer: No Typology Code available for payment source

## 2021-07-02 DIAGNOSIS — J309 Allergic rhinitis, unspecified: Secondary | ICD-10-CM | POA: Diagnosis not present

## 2021-07-12 IMAGING — MG DIGITAL SCREENING BILAT W/ CAD
4 series · 4 of 4 positions shown · non-contrast
Comparison: Previous exam(s).

CLINICAL DATA: Screening.

EXAM:
DIGITAL SCREENING BILATERAL MAMMOGRAM WITH CAD
TECHNIQUE: Bilateral screening digital craniocaudal and mediolateral oblique
mammograms were obtained. The images were evaluated with
computer-aided detection.

[L CC]
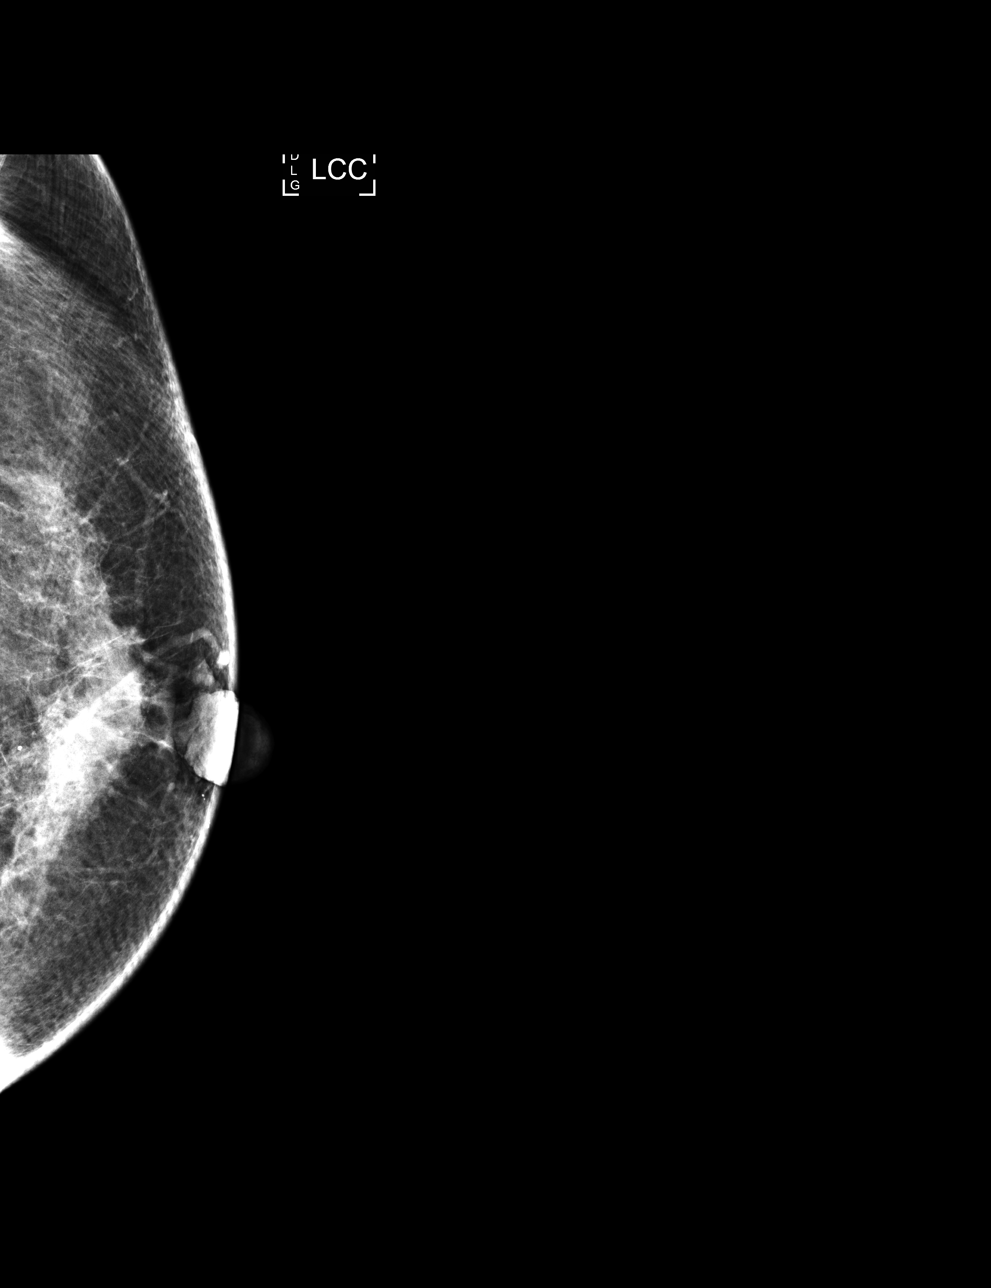

[R MLO]
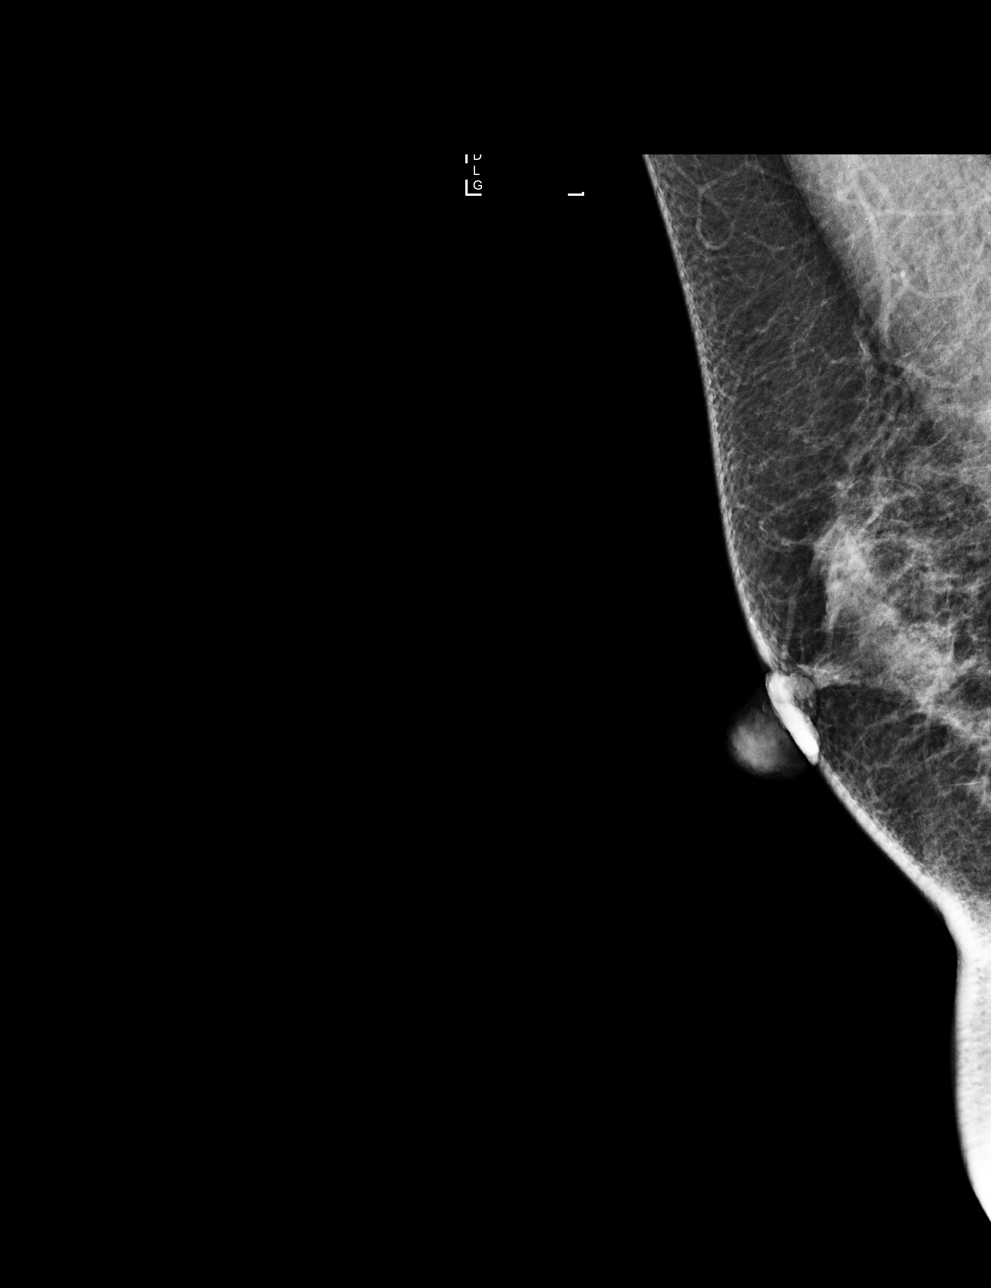

[L MLO]
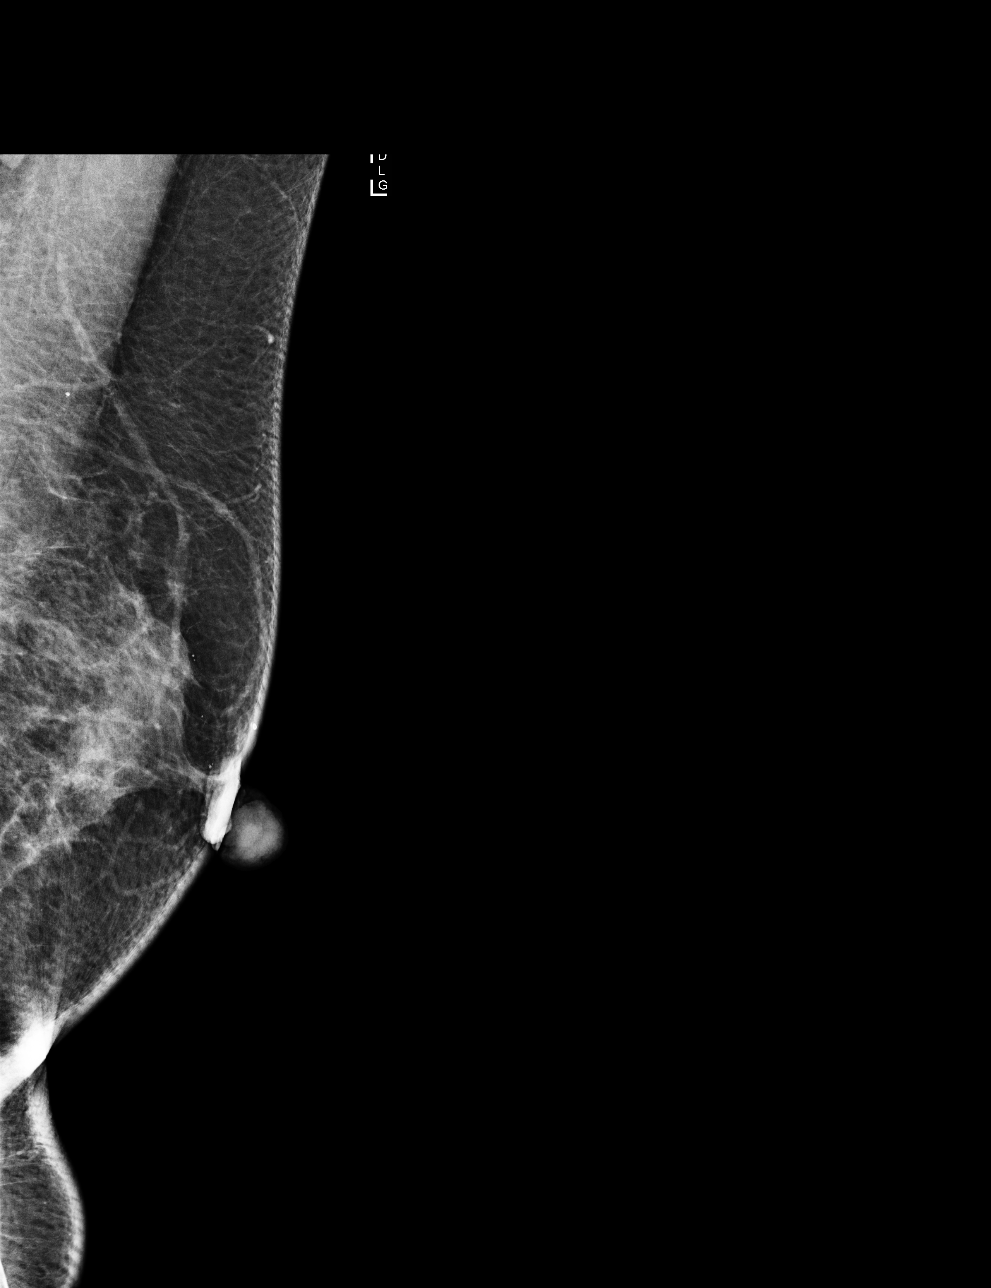

[R CC]
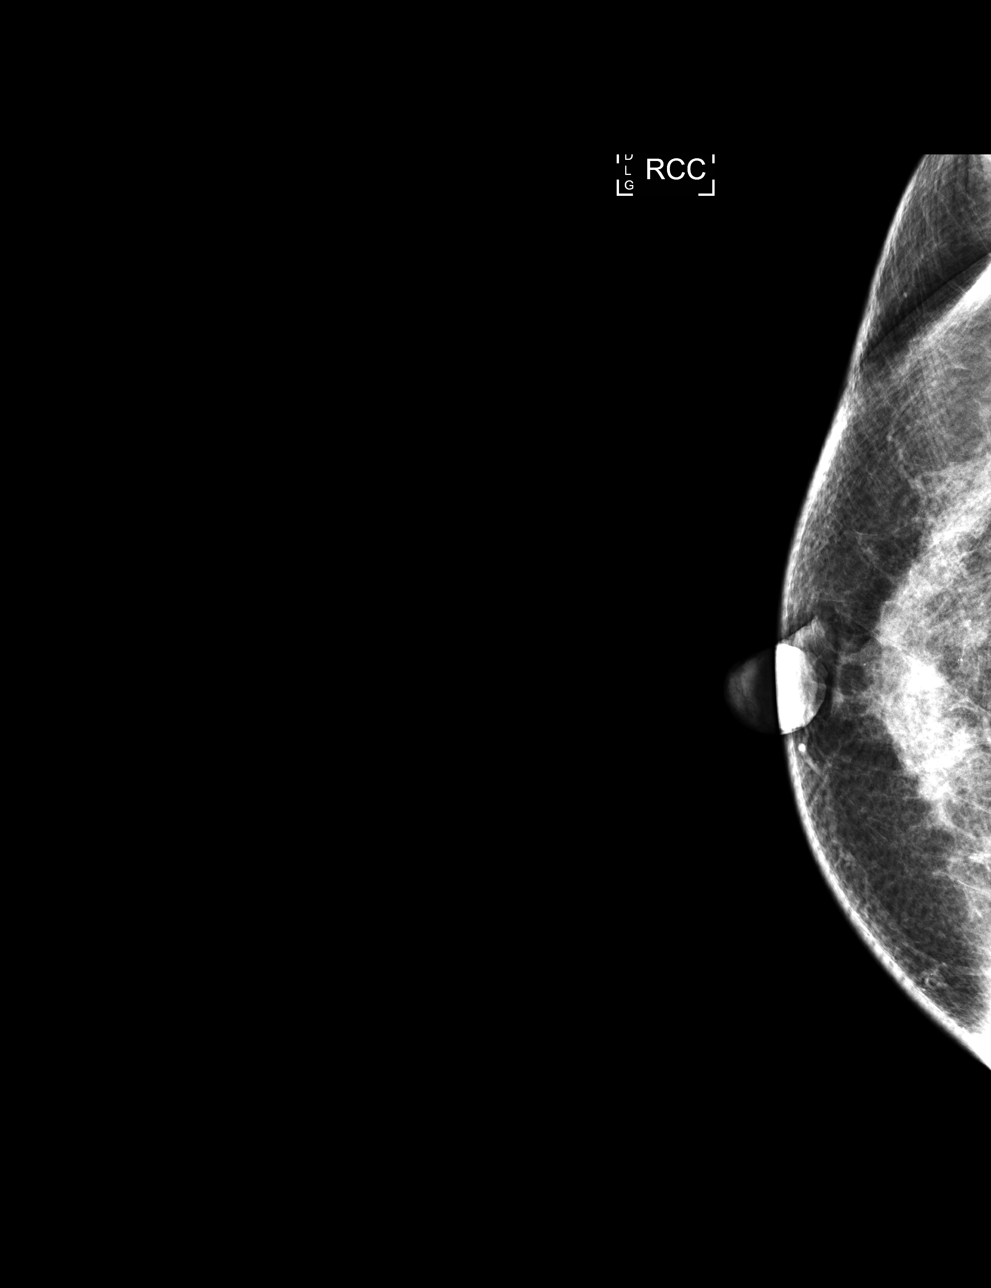

[4 of 4 positions shown; findings below may reference images not displayed]

ACR Breast Density Category c: The breast tissue is heterogeneously
dense, which may obscure small masses.
FINDINGS: There are no findings suspicious for malignancy. The images were
evaluated with computer-aided detection.
IMPRESSION: No mammographic evidence of malignancy. A result letter of this
screening mammogram will be mailed directly to the patient.

RECOMMENDATION:
Screening mammogram in one year. (Code:WI-W-Z4A)

BI-RADS CATEGORY  1: Negative.

## 2021-07-23 ENCOUNTER — Ambulatory Visit (INDEPENDENT_AMBULATORY_CARE_PROVIDER_SITE_OTHER): Payer: No Typology Code available for payment source

## 2021-07-23 DIAGNOSIS — J309 Allergic rhinitis, unspecified: Secondary | ICD-10-CM

## 2021-07-30 ENCOUNTER — Ambulatory Visit (INDEPENDENT_AMBULATORY_CARE_PROVIDER_SITE_OTHER): Payer: No Typology Code available for payment source | Admitting: *Deleted

## 2021-07-30 DIAGNOSIS — J309 Allergic rhinitis, unspecified: Secondary | ICD-10-CM

## 2021-08-06 ENCOUNTER — Ambulatory Visit (INDEPENDENT_AMBULATORY_CARE_PROVIDER_SITE_OTHER): Payer: No Typology Code available for payment source

## 2021-08-06 DIAGNOSIS — J309 Allergic rhinitis, unspecified: Secondary | ICD-10-CM | POA: Diagnosis not present

## 2021-08-12 NOTE — Progress Notes (Signed)
? ?Follow Up Note ? ?RE: Cindy [name] MRN: [id number] DOB: 02/18/1971 ?Date of Office Visit: 08/13/2021 ? ?Referring provider: Mila Palmer, MD ?Primary care provider: Mila Palmer, MD ? ?Chief Complaint: Follow-up ? ?History of Present Illness: ?I had the pleasure of seeing Cindy [name] for a follow up visit at the [facility] on 08/13/2021. She is a 51 y.o. female, who is being followed for allergic rhinitis on AIT and reactive airway disease. Her previous allergy office visit was on 04/04/2021 with Dr. Selena Batten. Today is a regular follow up visit. ? ?Reactive airway disease ?Currently taking Arnuity 1 puff once a day and wheezing resolved. However still has a few episodes of waking up in the morning with shortness of breath. ? ?She also had Covid-19 last month and used xopenex twice - treated with Paxlovid. ? ?Seasonal and perennial allergic rhinitis ?Tolerating allergy injections with some localized itching. ?Currently taking carbinoxamine 6mg  daily at night.  ?Stopped nasacort due to nosebleeds. Uses saline nasal spray.  ?Still having some nasal congestion.  ? ?Assessment and Plan: ?[name] is a 51 y.o. female with: ?Seasonal and perennial allergic rhinitis ?Past history - Perennial rhinitis symptoms for the past 40 years but worsening in the spring and winter.  Mood disorder with Zyrtec.  Tried Flonase and Astelin in the past with minimal improvement.  ENT evaluation over 10 years ago.  2 cats at home. 2021 skin testing showed: Positive to dust mites and cats, trees, mold, cockroach. Declines Singulair. Started AIT on 12/22/2019 (TCDM & MCR). ?Interim history - no more wheezing, some nasal congestion.  ?Continue allergy injections - given today.   ?Make sure you carry your Epipen on the days of your injections.  ?Continue environmental control measures ?May use carbinoxamine 4mg  1 to 1.5 tablet 1-2 times a day as needed.  ?Try Flonase sensamist 1 spray per nostril once a day for nasal  congestion. ?This is over the counter. ?If it causes nosebleeds then stop.  ?Nasal saline spray (i.e., Simply Saline) or nasal saline lavage (i.e., NeilMed) is recommended as needed and prior to medicated nasal sprays. ? ?Reactive airway disease ?Past history - Albuterol causes palpitations.  Used to be on Symbicort for a short while in the past. 2021 spirometry showed some restriction with 6% improvement in FEV1 post bronchodilator treatment.  She clinically felt better and liked Xopenex as it did not cause palpitation. Alvesco too expensive.  ?Interim history - wheezing resolved with Arnuity but has episodes of shortness of breath which wakes her up from sleeping. Covid-19 in Feb 2023. ?Today's spirometry showed some mild obstruction. ?Daily controller medication(s): START Arnuity 2022 1 puff once a day and rinse mouth after each use.  ?May use levoalbuterol rescue inhaler 2 puffs every 4 to 6 hours as needed for shortness of breath, chest tightness, coughing, and wheezing. May use levoalbuterol rescue inhaler 2 puffs 5 to 15 minutes prior to strenuous physical activities. Monitor frequency of use.  ?Get spirometry at next visit. ? ?Return in about 3 months (around 11/13/2021). ? ?Meds ordered this encounter  ?Medications  ? Fluticasone Furoate (ARNUITY ELLIPTA) 200 MCG/ACT AEPB  ?  Sig: Inhale 1 puff into the lungs daily. Rinse mouth after each use.  ?  Dispense:  30 each  ?  Refill:  3  ? ?Lab Orders  ?No laboratory test(s) ordered today  ? ? ?Diagnostics: ?Spirometry:  ?Tracings reviewed. Her effort: Good reproducible efforts. ?FVC: 2.26L ?FEV1: 1.59L, 70% predicted ?FEV1/FVC ratio: 70% ?  Interpretation: Spirometry consistent with mild obstructive disease.  ?Please see scanned spirometry results for details. ? ?Medication List:  ?Current Outpatient Medications  ?Medication Sig Dispense Refill  ? Carbinoxamine Maleate 4 MG TABS TAKE ONE AND ONE-HALF TABLETS BY MOUTH TWICE DAILY AS NEEDED for allergies 270 tablet  1  ? EPINEPHrine 0.3 mg/0.3 mL IJ SOAJ injection Inject 0.3 mLs (0.3 mg total) into the muscle as needed for anaphylaxis. 1 each 2  ? Fluticasone Furoate (ARNUITY ELLIPTA) 200 MCG/ACT AEPB Inhale 1 puff into the lungs daily. Rinse mouth after each use. 30 each 3  ? levalbuterol (XOPENEX HFA) 45 MCG/ACT inhaler Inhale 2 puffs into the lungs every 4 (four) hours as needed for wheezing or shortness of breath (coughing). 1 each 1  ? magnesium 30 MG tablet Take 30 mg by mouth daily.    ? olmesartan (BENICAR) 40 MG tablet Take 40 mg by mouth daily.    ? triamcinolone (NASACORT) 55 MCG/ACT AERO nasal inhaler Place 1 spray into the nose daily.    ? CALCIUM-VITAMIN D PO Take 1 tablet by mouth daily. (Patient not taking: Reported on 10/02/2020)    ? ?No current facility-administered medications for this visit.  ? ?Allergies: ?Allergies  ?Allergen Reactions  ? Iron Other (See Comments)  ? Minocycline Other (See Comments)  ? Other Other (See Comments)  ? ?I reviewed her past medical history, social history, family history, and environmental history and no significant changes have been reported from her previous visit. ? ?Review of Systems  ?Constitutional:  Negative for appetite change, chills, fever and unexpected weight change.  ?HENT:  Positive for congestion. Negative for postnasal drip and rhinorrhea.   ?Eyes:  Negative for itching.  ?Respiratory:  Positive for shortness of breath. Negative for cough, chest tightness and wheezing.   ?Cardiovascular:  Negative for chest pain.  ?Gastrointestinal:  Negative for abdominal pain and nausea.  ?Genitourinary:  Negative for difficulty urinating.  ?Skin:  Negative for rash.  ?Allergic/Immunologic: Positive for environmental allergies.  ?Neurological:  Negative for headaches.  ? ?Objective: ?BP 122/70   Pulse 76   Temp 97.8 ?F (36.6 ?C)   Resp 16   Ht 5\' 1"  (1.549 m)   Wt 131 lb (59.4 kg)   SpO2 99%   BMI 24.75 kg/m?  ?Body mass index is 24.75 kg/m?Marland Kitchen. ?Physical Exam ?Vitals and  nursing note reviewed.  ?Constitutional:   ?   Appearance: Normal appearance. She is well-developed.  ?HENT:  ?   Head: Normocephalic and atraumatic.  ?   Right Ear: Tympanic membrane and external ear normal.  ?   Left Ear: Tympanic membrane and external ear normal.  ?   Nose: Nose normal.  ?   Mouth/Throat:  ?   Mouth: Mucous membranes are moist.  ?   Pharynx: Oropharynx is clear.  ?Eyes:  ?   Conjunctiva/sclera: Conjunctivae normal.  ?Cardiovascular:  ?   Rate and Rhythm: Normal rate and regular rhythm.  ?   Heart sounds: Normal heart sounds. No murmur heard. ?  No friction rub. No gallop.  ?Pulmonary:  ?   Effort: Pulmonary effort is normal.  ?   Breath sounds: Normal breath sounds. No wheezing or rales.  ?Musculoskeletal:  ?   Cervical back: Neck supple.  ?Skin: ?   General: Skin is warm.  ?   Findings: No rash.  ?Neurological:  ?   Mental Status: She is alert and oriented to person, place, and time.  ?Psychiatric:     ?  Behavior: Behavior normal.  ? ?Previous notes and tests were reviewed. ?The plan was reviewed with the patient/family, and all questions/concerned were addressed. ? ?It was my pleasure to see Cindy [name] today and participate in her care. Please feel free to contact me with any questions or concerns. ? ?Sincerely, ? ?Wyline Mood, DO ?Allergy & Immunology ? ?[facility] ?[place] office: [phone] ?[place] office: [phone] ?

## 2021-08-13 ENCOUNTER — Ambulatory Visit: Payer: Self-pay

## 2021-08-13 ENCOUNTER — Ambulatory Visit: Payer: No Typology Code available for payment source | Admitting: Allergy

## 2021-08-13 ENCOUNTER — Other Ambulatory Visit: Payer: Self-pay

## 2021-08-13 ENCOUNTER — Encounter: Payer: Self-pay | Admitting: Allergy

## 2021-08-13 VITALS — BP 122/70 | HR 76 | Temp 97.8°F | Resp 16 | Ht 61.0 in | Wt 131.0 lb

## 2021-08-13 DIAGNOSIS — J4520 Mild intermittent asthma, uncomplicated: Secondary | ICD-10-CM

## 2021-08-13 DIAGNOSIS — J4540 Moderate persistent asthma, uncomplicated: Secondary | ICD-10-CM | POA: Diagnosis not present

## 2021-08-13 DIAGNOSIS — J309 Allergic rhinitis, unspecified: Secondary | ICD-10-CM

## 2021-08-13 DIAGNOSIS — J302 Other seasonal allergic rhinitis: Secondary | ICD-10-CM

## 2021-08-13 MED ORDER — ARNUITY ELLIPTA 200 MCG/ACT IN AEPB: 1.0000 | INHALATION_SPRAY | Freq: Every day | RESPIRATORY_TRACT | 3 refills | Status: DC

## 2021-08-13 NOTE — Assessment & Plan Note (Signed)
Past history - Perennial rhinitis symptoms for the past 40 years but worsening in the spring and winter.  Mood disorder with Zyrtec.  Tried Flonase and Astelin in the past with minimal improvement.  ENT evaluation over 10 years ago.  2 cats at home. 2021 skin testing showed: Positive to dust mites and cats, trees, mold, cockroach. Declines Singulair. Started AIT on 12/22/2019 (TCDM & MCR). ?Interim history - no more wheezing, some nasal congestion.  ?? Continue allergy injections - given today.   ?? Make sure you carry your Epipen on the days of your injections.  ?? Continue environmental control measures ?? May use carbinoxamine 4mg  1 to 1.5 tablet 1-2 times a day as needed.  ?? Try Flonase sensamist 1 spray per nostril once a day for nasal congestion. ?? This is over the counter. ?? If it causes nosebleeds then stop.  ?? Nasal saline spray (i.e., Simply Saline) or nasal saline lavage (i.e., NeilMed) is recommended as needed and prior to medicated nasal sprays. ?

## 2021-08-13 NOTE — Assessment & Plan Note (Addendum)
Past history - Albuterol causes palpitations.  Used to be on Symbicort for a short while in the past. 2021 spirometry showed some restriction with 6% improvement in FEV1 post bronchodilator treatment.  She clinically felt better and liked Xopenex as it did not cause palpitation. Alvesco too expensive.  ?Interim history - wheezing resolved with Arnuity but has episodes of shortness of breath which wakes her up from sleeping. Covid-19 in Feb 2023. ?? Today's spirometry showed some mild obstruction. ?? Daily controller medication(s): START Arnuity 1 puff once a day and rinse mouth after each use.  ?? May use levoalbuterol rescue inhaler 2 puffs every 4 to 6 hours as needed for shortness of breath, chest tightness, coughing, and wheezing. May use levoalbuterol rescue inhaler 2 puffs 5 to 15 minutes prior to strenuous physical activities. Monitor frequency of use.  ?? Get spirometry at next visit. ?

## 2021-08-13 NOTE — Patient Instructions (Addendum)
Other allergic rhinitis ?Past skin testing showed: Positive to dust mites and cats, trees, mold, cockroach. ?Continue allergy injections - given today.   ?Make sure you carry your Epipen on the days of your injections.  ?Continue environmental control measures ?May use carbinoxamine 4mg  1 to 1.5 tablet 1-2 times a day as needed.  ?Try Flonase sensamist 1 spray per nostril once a day for nasal congestion. ?This is over the counter. ?If it causes nosebleeds then stop.  ?Nasal saline spray (i.e., Simply Saline) or nasal saline lavage (i.e., NeilMed) is recommended as needed and prior to medicated nasal sprays. ? ?Reactive airway disease ?Daily controller medication(s): START Arnuity 1 puff once a day and rinse mouth after each use.  ?See if you have less waking up at night episodes.  ?May use levoalbuterol rescue inhaler 2 puffs every 4 to 6 hours as needed for shortness of breath, chest tightness, coughing, and wheezing. May use levoalbuterol rescue inhaler 2 puffs 5 to 15 minutes prior to strenuous physical activities. Monitor frequency of use.  ?Breathing control goals:  ?Full participation in all desired activities (may need albuterol before activity) ?Albuterol use two times or less a week on average (not counting use with activity) ?Cough interfering with sleep two times or less a month ?Oral steroids no more than once a year ?No hospitalizations  ? ?Follow up in 3 months or sooner if needed.  ?

## 2021-08-21 ENCOUNTER — Ambulatory Visit (INDEPENDENT_AMBULATORY_CARE_PROVIDER_SITE_OTHER): Payer: No Typology Code available for payment source

## 2021-08-21 DIAGNOSIS — J309 Allergic rhinitis, unspecified: Secondary | ICD-10-CM

## 2021-09-04 ENCOUNTER — Ambulatory Visit (INDEPENDENT_AMBULATORY_CARE_PROVIDER_SITE_OTHER): Payer: No Typology Code available for payment source

## 2021-09-04 DIAGNOSIS — J309 Allergic rhinitis, unspecified: Secondary | ICD-10-CM | POA: Diagnosis not present

## 2021-09-18 ENCOUNTER — Ambulatory Visit (INDEPENDENT_AMBULATORY_CARE_PROVIDER_SITE_OTHER): Payer: No Typology Code available for payment source

## 2021-09-18 DIAGNOSIS — J309 Allergic rhinitis, unspecified: Secondary | ICD-10-CM

## 2021-10-01 ENCOUNTER — Ambulatory Visit (INDEPENDENT_AMBULATORY_CARE_PROVIDER_SITE_OTHER): Payer: No Typology Code available for payment source

## 2021-10-01 DIAGNOSIS — J309 Allergic rhinitis, unspecified: Secondary | ICD-10-CM | POA: Diagnosis not present

## 2021-10-03 DIAGNOSIS — J302 Other seasonal allergic rhinitis: Secondary | ICD-10-CM | POA: Diagnosis not present

## 2021-10-03 NOTE — Progress Notes (Signed)
VIALS EXP 10-04-22 ?

## 2021-10-12 DIAGNOSIS — J3089 Other allergic rhinitis: Secondary | ICD-10-CM | POA: Diagnosis not present

## 2021-10-16 ENCOUNTER — Ambulatory Visit (INDEPENDENT_AMBULATORY_CARE_PROVIDER_SITE_OTHER): Payer: No Typology Code available for payment source

## 2021-10-16 DIAGNOSIS — J309 Allergic rhinitis, unspecified: Secondary | ICD-10-CM | POA: Diagnosis not present

## 2021-10-30 ENCOUNTER — Ambulatory Visit (INDEPENDENT_AMBULATORY_CARE_PROVIDER_SITE_OTHER): Payer: No Typology Code available for payment source

## 2021-10-30 DIAGNOSIS — J309 Allergic rhinitis, unspecified: Secondary | ICD-10-CM | POA: Diagnosis not present

## 2021-11-28 ENCOUNTER — Ambulatory Visit: Payer: No Typology Code available for payment source | Admitting: Allergy

## 2021-12-24 ENCOUNTER — Ambulatory Visit (INDEPENDENT_AMBULATORY_CARE_PROVIDER_SITE_OTHER): Payer: No Typology Code available for payment source

## 2021-12-24 DIAGNOSIS — J309 Allergic rhinitis, unspecified: Secondary | ICD-10-CM

## 2022-01-03 ENCOUNTER — Emergency Department (HOSPITAL_BASED_OUTPATIENT_CLINIC_OR_DEPARTMENT_OTHER)
Admission: EM | Admit: 2022-01-03 | Discharge: 2022-01-03 | Disposition: A | Discharge status: Home / Self Care | Payer: 59 | Attending: Emergency Medicine | Admitting: Emergency Medicine

## 2022-01-03 ENCOUNTER — Encounter (HOSPITAL_BASED_OUTPATIENT_CLINIC_OR_DEPARTMENT_OTHER): Payer: Self-pay | Admitting: Emergency Medicine

## 2022-01-03 ENCOUNTER — Other Ambulatory Visit: Payer: Self-pay

## 2022-01-03 DIAGNOSIS — H8110 Benign paroxysmal vertigo, unspecified ear: Secondary | ICD-10-CM | POA: Insufficient documentation

## 2022-01-03 DIAGNOSIS — R42 Dizziness and giddiness: Secondary | ICD-10-CM | POA: Diagnosis present

## 2022-01-03 HISTORY — DX: Essential (primary) hypertension: I10

## 2022-01-03 LAB — CBC
HCT: 38.9 % (ref 36.0–46.0)
Hemoglobin: 12.7 g/dL (ref 12.0–15.0)
MCH: 28.3 pg (ref 26.0–34.0)
MCHC: 32.6 g/dL (ref 30.0–36.0)
MCV: 86.8 fL (ref 80.0–100.0)
Platelets: 328 10*3/uL (ref 150–400)
RBC: 4.48 MIL/uL (ref 3.87–5.11)
RDW: 12.2 % (ref 11.5–15.5)
WBC: 7.6 10*3/uL (ref 4.0–10.5)
nRBC: 0 % (ref 0.0–0.2)

## 2022-01-03 LAB — BASIC METABOLIC PANEL WITH GFR
Anion gap: 11 (ref 5–15)
BUN: 17 mg/dL (ref 6–20)
CO2: 24 mmol/L (ref 22–32)
Calcium: 9 mg/dL (ref 8.9–10.3)
Chloride: 104 mmol/L (ref 98–111)
Creatinine, Ser: 0.63 mg/dL (ref 0.44–1.00)
GFR, Estimated: >60 mL/min
Glucose, Bld: 175 mg/dL — ABNORMAL HIGH (ref 70–99)
Potassium: 3.7 mmol/L (ref 3.5–5.1)
Sodium: 139 mmol/L (ref 135–145)

## 2022-01-03 LAB — CBG MONITORING, ED: Glucose-Capillary: 182 mg/dL — ABNORMAL HIGH (ref 70–99)

## 2022-01-03 MED ORDER — MECLIZINE HCL 25 MG PO TABS: 25.0000 mg | ORAL_TABLET | Freq: Three times a day (TID) | ORAL | 0 refills | Status: DC | PRN

## 2022-01-03 MED ORDER — ONDANSETRON HCL 4 MG/2ML IJ SOLN
4.0000 mg | Freq: Once | INTRAMUSCULAR | Status: AC
  Administered 2022-01-03: 4 mg via INTRAVENOUS
  Filled 2022-01-03: qty 2

## 2022-01-03 MED ORDER — ONDANSETRON 4 MG PO TBDP
4.0000 mg | ORAL_TABLET | Freq: Three times a day (TID) | ORAL | 0 refills | Status: DC | PRN
Start: 2022-01-03 — End: 2022-05-31

## 2022-01-03 MED ORDER — SODIUM CHLORIDE 0.9 % IV BOLUS
1000.0000 mL | Freq: Once | INTRAVENOUS | Status: AC
Start: 2022-01-03 — End: 2022-01-03
  Administered 2022-01-03: 1000 mL via INTRAVENOUS

## 2022-01-03 MED ORDER — DIAZEPAM 5 MG PO TABS
5.0000 mg | ORAL_TABLET | Freq: Once | ORAL | Status: AC
  Administered 2022-01-03: 5 mg via ORAL
  Filled 2022-01-03: qty 1

## 2022-01-03 MED ORDER — MECLIZINE HCL 25 MG PO TABS
50.0000 mg | ORAL_TABLET | Freq: Once | ORAL | Status: AC
  Administered 2022-01-03: 50 mg via ORAL
  Filled 2022-01-03: qty 2

## 2022-01-03 NOTE — ED Triage Notes (Signed)
Pt arrives to ED with c/o dizziness. Pt reports she woke up dizzy yesterday. She states the dizziness is constant and has worsened.

## 2022-01-03 NOTE — ED Notes (Signed)
CBG results given to [facility] RN

## 2022-01-03 NOTE — ED Notes (Signed)
Patient verbalizes understanding of discharge instructions. Opportunity for questioning and answers were provided. Patient discharged from ED.  °

## 2022-01-03 NOTE — ED Provider Notes (Signed)
[place] EMERGENCY DEPT Provider Note   CSN: [id number] Arrival date & time: 01/03/22  [id number]     History  Chief Complaint  Patient presents with   Dizziness    Cindy [name] is a 51 y.o. female presenting to the emergency department with complaint of vertigo.  She reports abrupt onset of symptoms yesterday, intense episodes of room spinning, which would go away completely at times.  She was feeling better throughout the day.  This morning however when she woke up she had abrupt severe onset of return of her symptoms, worse with any type of head movement, also associated with nausea and dry heaving.  She denies ear pain, ear ringing, or any pain in her ears.  She reports a history of vertigo in the past about 12 years ago, for which she was on meclizine, but no longer has this medicine at home.  HPI     Home Medications Prior to Admission medications   Medication Sig Start Date End Date Taking? Authorizing Provider  meclizine (ANTIVERT) 25 MG tablet Take 1 tablet (25 mg total) by mouth 3 (three) times daily as needed for up to 30 doses for dizziness. 01/03/22  Yes Wyvonnia Dusky, MD  ondansetron (ZOFRAN-ODT) 4 MG disintegrating tablet Take 1 tablet (4 mg total) by mouth every 8 (eight) hours as needed for up to 15 doses for nausea or vomiting. 01/03/22  Yes Curties Conigliaro, Carola Rhine, MD  Carbinoxamine Maleate 4 MG TABS TAKE ONE AND ONE-HALF TABLETS BY MOUTH TWICE DAILY AS NEEDED for allergies 05/09/21   Garnet Sierras, DO  EPINEPHrine 0.3 mg/0.3 mL IJ SOAJ injection Inject 0.3 mLs (0.3 mg total) into the muscle as needed for anaphylaxis. 12/01/19   Garnet Sierras, DO  Fluticasone Furoate (ARNUITY ELLIPTA) 200 MCG/ACT AEPB Inhale 1 puff into the lungs daily. Rinse mouth after each use. 08/13/21   Garnet Sierras, DO  levalbuterol [facility] HFA) 45 MCG/ACT inhaler Inhale 2 puffs into the lungs every 4 (four) hours as needed for wheezing or shortness of breath (coughing). 04/04/21   Garnet Sierras, DO   magnesium 30 MG tablet Take 30 mg by mouth daily.    [provider]  olmesartan (BENICAR) 40 MG tablet Take 40 mg by mouth daily.    [provider]  triamcinolone (NASACORT) 55 MCG/ACT AERO nasal inhaler Place 1 spray into the nose daily.    [provider]      Allergies    Doxycycline, Iron, Minocycline, and Other    Review of Systems   Review of Systems  Physical Exam Updated Vital Signs BP (!) 142/117   Pulse 92   Temp 97.8 F (36.6 C)   Resp 15   Ht 5\' 1"  (1.549 m)   Wt 58.1 kg   SpO2 98%   BMI 24.19 kg/m  Physical Exam Constitutional:      General: She is not in acute distress. HENT:     Head: Normocephalic and atraumatic.  Eyes:     Conjunctiva/sclera: Conjunctivae normal.     Pupils: Pupils are equal, round, and reactive to light.  Cardiovascular:     Rate and Rhythm: Normal rate and regular rhythm.  Pulmonary:     Effort: Pulmonary effort is normal. No respiratory distress.  Skin:    General: Skin is warm and dry.  Neurological:     General: No focal deficit present.     Mental Status: She is alert. Mental status is at baseline.  Comments: Unilateral beating nystagmus Vertigo worse with any head movement  Psychiatric:        Mood and Affect: Mood normal.        Behavior: Behavior normal.     ED Results / Procedures / Treatments   Labs (all labs ordered are listed, but only abnormal results are displayed) Labs Reviewed  BASIC METABOLIC PANEL - Abnormal; Notable for the following components:      Result Value   Glucose, Bld 175 (*)    All other components within normal limits  CBG MONITORING, ED - Abnormal; Notable for the following components:   Glucose-Capillary 182 (*)    All other components within normal limits  CBC    EKG EKG Interpretation  Date/Time:  Thursday January 03 2022 10:16:15 EDT Ventricular Rate:  86 PR Interval:  177 QRS Duration: 79 QT Interval:  369 QTC Calculation: 442 R  Axis:   77 Text Interpretation: Sinus rhythm Confirmed by Alvester Chou [phone] on 01/03/2022 1:20:04 PM  Radiology No results found.  Procedures Procedures    Medications Ordered in ED Medications  meclizine (ANTIVERT) tablet 50 mg (50 mg Oral Given 01/03/22 1131)  ondansetron (ZOFRAN) injection 4 mg (4 mg Intravenous Given 01/03/22 1128)  diazepam (VALIUM) tablet 5 mg (5 mg Oral Given 01/03/22 1132)  sodium chloride 0.9 % bolus 1,000 mL (0 mLs Intravenous Stopped 01/03/22 1253)    ED Course/ Medical Decision Making/ A&P Clinical Course as of 01/03/22 1335  Thu Jan 03, 2022  1332 Patient is significantly symptomatically improved after her medications.  She is sitting up in bed was able to get up and use the bathroom and says she is feeling better and wanting to go home.  I do suspect this is peripheral vertigo.  They will follow-up with ENT [MT]  1332 I suspect last blood pressure was likely erroneous, not 142/117 [MT]    Clinical Course User Index [MT] Stevens Magwood, Kermit Balo, MD                           Medical Decision Making Amount and/or Complexity of Data Reviewed Labs: ordered.  Risk Prescription drug management.   Patient is here with what I strongly suspect is peripheral vertigo, consistent with BPPV, given her prior history of similar symptoms, the episodic nature over the past 2 days, and the easy reproducibility with any head movement.  Have a lower suspicion for cerebral CVA, she has no acute stroke risk factors otherwise.  We can try meclizine, some Valium, some fluids and Zofran for her vertigo and nausea.  I personally viewed and interpreted her labs which are unremarkable.  No significant electrolyte derangement.  I do not see evidence of Mnire's disease or significant inner ear trauma or injury or infection.  She could be referred to ENT for follow-up of her symptoms are improved          Final Clinical Impression(s) / ED Diagnoses Final diagnoses:   Benign paroxysmal positional vertigo, unspecified laterality    Rx / DC Orders ED Discharge Orders          Ordered    meclizine (ANTIVERT) 25 MG tablet  3 times daily PRN        01/03/22 1334    ondansetron (ZOFRAN-ODT) 4 MG disintegrating tablet  Every 8 hours PRN        01/03/22 1334              Deirdra Heumann,  Kermit Balo, MD 01/03/22 1335

## 2022-01-13 NOTE — Progress Notes (Unsigned)
Follow Up Note  RE: Cindy [name] MRN: [id number] DOB: [birth year] Date of Office Visit: 01/14/2022  Referring provider: Mila Palmer, MD Primary care provider: Mila Palmer, MD  Chief Complaint: Seasonal and Perennial Allergic Rhinitis (3 mth f/u - [place]) and Mild Intermittent reactive Airway disease without complicat (3 mth f/u - [place])  History of Present Illness: I had the pleasure of seeing Cindy [name] for a follow up visit at the [facility] on 01/14/2022. She is a 51 y.o. female, who is being followed for allergic rhinitis on AIT and reactive airway disease. Her previous allergy office visit was on 08/13/2021 with Dr. Selena Batten. Today is a regular follow up visit.  Seasonal and perennial allergic rhinitis Patient went to [place] for 7 weeks and had flare in her sinus nasal congestion. She went to ER on 8/10 for vertigo.  Saw ENT last Friday and scheduled for MRI.  Currently taking carbinoxamine 1 to 1.5 tablet once a day as it causes drowsiness. Started on azelastine per ENT. Using Nasacort 1 spray per nostril once a day as needed.   Currently on prednisone and feeling slightly better.   Reactive airway disease Finished Arnuity in July.  Denies any SOB, coughing, wheezing, chest tightness, nocturnal awakenings since the last visit. Used albuterol once while traveling abroad.  On prednisone now for a different issue.   Assessment and Plan: Cindy [name] is a 51 y.o. female with: Seasonal and perennial allergic rhinitis Past history - Perennial rhinitis symptoms for the past 40 years but worsening in the spring and winter.  Mood disorder with Zyrtec.  Tried Flonase and Astelin in the past with minimal improvement.  ENT evaluation over 10 years ago.  2 cats at home. 2021 skin testing showed: Positive to dust mites and cats, trees, mold, cockroach. Declines Singulair. Started AIT on 12/22/2019 (TCDM & MCR). Interim history - symptoms flared while traveling abroad and now  has vertigo issues.  Continue allergy injections - given today.   Make sure you carry your Epipen on the days of your injections.  Continue environmental control measures May use carbinoxamine 4mg  1 to 1.5 tablet 1-2 times a day as needed.  Continue Nasacort 1 spray per nostril once a day for nasal congestion. If it causes nosebleeds then stop.  Use azelastine nasal spray 1-2 sprays per nostril twice a day as needed for runny nose/drainage. Nasal saline spray (i.e., Simply Saline) or nasal saline lavage (i.e., NeilMed) is recommended as needed and prior to medicated nasal sprays. Follow up with ENT and MRI as scheduled.   Reactive airway disease Past history - Albuterol causes palpitations.  Used to be on Symbicort for a short while in the past. 2021 spirometry showed some restriction with 6% improvement in FEV1 post bronchodilator treatment.  She clinically felt better and liked Xopenex as it did not cause palpitation. Alvesco too expensive. Covid-19 in Feb 2023. Interim history - shortness of breath resolved. Stopped Arnuity in July. Now on prednisone for something else.  Unable to get spirometry due to mechanical issues with the machine. Daily controller medication(s): none.  May use levoalbuterol rescue inhaler 2 puffs every 4 to 6 hours as needed for shortness of breath, chest tightness, coughing, and wheezing. May use levoalbuterol rescue inhaler 2 puffs 5 to 15 minutes prior to strenuous physical activities. Monitor frequency of use.  Return in about 4 months (around 05/16/2022).  No orders of the defined types were placed in this encounter.  Lab Orders  No  laboratory test(s) ordered today    Diagnostics: Spirometry: Unable to obtain due to mechanical issue with the spirometry machine.  Medication List:  Current Outpatient Medications  Medication Sig Dispense Refill   Ascorbic Acid (VITAMIN C) 500 MG CAPS Take 1 capsule by mouth daily at 12 noon.     azelastine (ASTELIN) 0.1  % nasal spray Place 1 spray into both nostrils 2 (two) times daily. Use in each nostril as directed     Carbinoxamine Maleate 4 MG TABS TAKE ONE AND ONE-HALF TABLETS BY MOUTH TWICE DAILY AS NEEDED for allergies 270 tablet 1   Cholecalciferol (VITAMIN D) 125 MCG (5000 UT) CAPS Take 1 capsule by mouth daily at 12 noon.     EPINEPHrine 0.3 mg/0.3 mL IJ SOAJ injection Inject 0.3 mLs (0.3 mg total) into the muscle as needed for anaphylaxis. 1 each 2   levalbuterol (XOPENEX HFA) 45 MCG/ACT inhaler Inhale 2 puffs into the lungs every 4 (four) hours as needed for wheezing or shortness of breath (coughing). 1 each 1   olmesartan (BENICAR) 40 MG tablet Take 40 mg by mouth daily.     ondansetron (ZOFRAN-ODT) 4 MG disintegrating tablet Take 1 tablet (4 mg total) by mouth every 8 (eight) hours as needed for up to 15 doses for nausea or vomiting. 15 tablet 0   predniSONE (STERAPRED UNI-PAK 21 TAB) 10 MG (21) TBPK tablet Take 1 tablet by mouth as directed.     triamcinolone (NASACORT) 55 MCG/ACT AERO nasal inhaler Place 1 spray into the nose daily.     No current facility-administered medications for this visit.   Allergies: Allergies  Allergen Reactions   Doxycycline Other (See Comments)   Iron Other (See Comments)   Minocycline Other (See Comments)   Other Other (See Comments)   I reviewed her past medical history, social history, family history, and environmental history and no significant changes have been reported from her previous visit.  Review of Systems  Constitutional:  Negative for appetite change, chills, fever and unexpected weight change.  HENT:  Positive for congestion and postnasal drip. Negative for rhinorrhea.   Eyes:  Negative for itching.  Respiratory:  Negative for cough, chest tightness, shortness of breath and wheezing.   Cardiovascular:  Negative for chest pain.  Gastrointestinal:  Negative for abdominal pain and nausea.  Genitourinary:  Negative for difficulty urinating.   Skin:  Negative for rash.  Allergic/Immunologic: Positive for environmental allergies.  Neurological:  Positive for dizziness and light-headedness. Negative for headaches.    Objective: BP 130/80   Pulse 82   Temp 98.1 F (36.7 C)   Resp 16   Ht 5\' 1"  (1.549 m)   Wt 129 lb 6.4 oz (58.7 kg)   SpO2 99%   BMI 24.45 kg/m  Body mass index is 24.45 kg/m. Physical Exam Vitals and nursing note reviewed.  Constitutional:      Appearance: Normal appearance. She is well-developed.  HENT:     Head: Normocephalic and atraumatic.     Right Ear: Tympanic membrane and external ear normal.     Left Ear: Tympanic membrane and external ear normal.     Nose: Nose normal.     Mouth/Throat:     Mouth: Mucous membranes are moist.     Pharynx: Oropharynx is clear.  Eyes:     Conjunctiva/sclera: Conjunctivae normal.  Cardiovascular:     Rate and Rhythm: Normal rate and regular rhythm.     Heart sounds: Normal heart sounds. No murmur heard.  No friction rub. No gallop.  Pulmonary:     Effort: Pulmonary effort is normal.     Breath sounds: Normal breath sounds. No wheezing or rales.  Musculoskeletal:     Cervical back: Neck supple.  Skin:    General: Skin is warm.     Findings: No rash.  Neurological:     Mental Status: She is alert and oriented to person, place, and time.  Psychiatric:        Behavior: Behavior normal.   Previous notes and tests were reviewed. The plan was reviewed with the patient/family, and all questions/concerned were addressed.  It was my pleasure to see Laray today and participate in her care. Please feel free to contact me with any questions or concerns.  Sincerely,  Wyline Mood, DO Allergy & Immunology  [facility] office: [phone] [facility] office: [phone]

## 2022-01-14 ENCOUNTER — Other Ambulatory Visit: Payer: Self-pay | Admitting: Otolaryngology

## 2022-01-14 ENCOUNTER — Ambulatory Visit (INDEPENDENT_AMBULATORY_CARE_PROVIDER_SITE_OTHER): Payer: 59 | Admitting: Allergy

## 2022-01-14 ENCOUNTER — Encounter: Payer: Self-pay | Admitting: Allergy

## 2022-01-14 ENCOUNTER — Ambulatory Visit: Payer: Self-pay | Admitting: *Deleted

## 2022-01-14 VITALS — BP 130/80 | HR 82 | Temp 98.1°F | Resp 16 | Ht 61.0 in | Wt 129.4 lb

## 2022-01-14 DIAGNOSIS — H903 Sensorineural hearing loss, bilateral: Secondary | ICD-10-CM

## 2022-01-14 DIAGNOSIS — J4540 Moderate persistent asthma, uncomplicated: Secondary | ICD-10-CM | POA: Diagnosis not present

## 2022-01-14 DIAGNOSIS — J309 Allergic rhinitis, unspecified: Secondary | ICD-10-CM

## 2022-01-14 DIAGNOSIS — R42 Dizziness and giddiness: Secondary | ICD-10-CM

## 2022-01-14 DIAGNOSIS — J302 Other seasonal allergic rhinitis: Secondary | ICD-10-CM

## 2022-01-14 NOTE — Assessment & Plan Note (Signed)
Past history - Albuterol causes palpitations.  Used to be on Symbicort for a short while in the past. 2021 spirometry showed some restriction with 6% improvement in FEV1 post bronchodilator treatment.  She clinically felt better and liked Xopenex as it did not cause palpitation. Alvesco too expensive. Covid-19 in Feb 2023. Interim history - shortness of breath resolved. Stopped Arnuity in July. Now on prednisone for something else.   Unable to get spirometry due to mechanical issues with the machine.  Daily controller medication(s): none.   May use levoalbuterol rescue inhaler 2 puffs every 4 to 6 hours as needed for shortness of breath, chest tightness, coughing, and wheezing. May use levoalbuterol rescue inhaler 2 puffs 5 to 15 minutes prior to strenuous physical activities. Monitor frequency of use.

## 2022-01-14 NOTE — Assessment & Plan Note (Signed)
Past history - Perennial rhinitis symptoms for the past 40 years but worsening in the spring and winter.  Mood disorder with Zyrtec.  Tried Flonase and Astelin in the past with minimal improvement.  ENT evaluation over 10 years ago.  2 cats at home. 2021 skin testing showed: Positive to dust mites and cats, trees, mold, cockroach. Declines Singulair. Started AIT on 12/22/2019 (TCDM & MCR). Interim history - symptoms flared while traveling abroad and now has vertigo issues.   Continue allergy injections - given today.    Make sure you carry your Epipen on the days of your injections.   Continue environmental control measures  May use carbinoxamine 4mg  1 to 1.5 tablet 1-2 times a day as needed.   Continue Nasacort 1 spray per nostril once a day for nasal congestion.  If it causes nosebleeds then stop.   Use azelastine nasal spray 1-2 sprays per nostril twice a day as needed for runny nose/drainage.  Nasal saline spray (i.e., Simply Saline) or nasal saline lavage (i.e., NeilMed) is recommended as needed and prior to medicated nasal sprays.  Follow up with ENT and MRI as scheduled.

## 2022-01-14 NOTE — Patient Instructions (Addendum)
Allergic rhinitis Past skin testing showed: Positive to dust mites and cats, trees, mold, cockroach. Continue allergy injections - given today.   Make sure you carry your Epipen on the days of your injections.  Continue environmental control measures May use carbinoxamine 4mg  1 to 1.5 tablet 1-2 times a day as needed.  Continue Nasacort 1 spray per nostril once a day for nasal congestion. If it causes nosebleeds then stop.  Use azelastine nasal spray 1-2 sprays per nostril twice a day as needed for runny nose/drainage. Nasal saline spray (i.e., Simply Saline) or nasal saline lavage (i.e., NeilMed) is recommended as needed and prior to medicated nasal sprays. Follow up with ENT and MRI as scheduled.   Reactive airway disease Daily controller medication(s): none.  May use levoalbuterol rescue inhaler 2 puffs every 4 to 6 hours as needed for shortness of breath, chest tightness, coughing, and wheezing. May use levoalbuterol rescue inhaler 2 puffs 5 to 15 minutes prior to strenuous physical activities. Monitor frequency of use.  Breathing control goals:  Full participation in all desired activities (may need albuterol before activity) Albuterol use two times or less a week on average (not counting use with activity) Cough interfering with sleep two times or less a month Oral steroids no more than once a year No hospitalizations   Follow up in 4 months or sooner if needed.  Our [place] office is moving in September 2023 to a new location. New address: [address] [place], [place], [place] (white building). [place] office: [phone] (same phone number).

## 2022-01-21 ENCOUNTER — Ambulatory Visit (INDEPENDENT_AMBULATORY_CARE_PROVIDER_SITE_OTHER): Payer: 59

## 2022-01-21 DIAGNOSIS — J309 Allergic rhinitis, unspecified: Secondary | ICD-10-CM | POA: Diagnosis not present

## 2022-01-26 ENCOUNTER — Ambulatory Visit
Admission: RE | Admit: 2022-01-26 | Discharge: 2022-01-26 | Disposition: A | Discharge status: Home / Self Care | Payer: 59 | Source: Ambulatory Visit | Attending: Otolaryngology | Admitting: Otolaryngology

## 2022-01-26 DIAGNOSIS — H903 Sensorineural hearing loss, bilateral: Secondary | ICD-10-CM

## 2022-01-26 DIAGNOSIS — R42 Dizziness and giddiness: Secondary | ICD-10-CM

## 2022-01-26 MED ORDER — GADOPICLENOL 0.5 MMOL/ML IV SOLN
5.0000 mL | Freq: Once | INTRAVENOUS | Status: AC | PRN
Start: 2022-01-26 — End: 2022-01-26
  Administered 2022-01-26: 5 mL via INTRAVENOUS

## 2022-01-29 ENCOUNTER — Ambulatory Visit (INDEPENDENT_AMBULATORY_CARE_PROVIDER_SITE_OTHER): Payer: 59

## 2022-01-29 DIAGNOSIS — J309 Allergic rhinitis, unspecified: Secondary | ICD-10-CM

## 2022-02-04 ENCOUNTER — Ambulatory Visit (INDEPENDENT_AMBULATORY_CARE_PROVIDER_SITE_OTHER): Payer: 59

## 2022-02-04 DIAGNOSIS — J309 Allergic rhinitis, unspecified: Secondary | ICD-10-CM

## 2022-02-26 ENCOUNTER — Ambulatory Visit (INDEPENDENT_AMBULATORY_CARE_PROVIDER_SITE_OTHER): Payer: 59

## 2022-02-26 DIAGNOSIS — J309 Allergic rhinitis, unspecified: Secondary | ICD-10-CM | POA: Diagnosis not present

## 2022-03-04 ENCOUNTER — Ambulatory Visit (INDEPENDENT_AMBULATORY_CARE_PROVIDER_SITE_OTHER): Payer: 59

## 2022-03-04 DIAGNOSIS — J309 Allergic rhinitis, unspecified: Secondary | ICD-10-CM | POA: Diagnosis not present

## 2022-03-11 ENCOUNTER — Ambulatory Visit (INDEPENDENT_AMBULATORY_CARE_PROVIDER_SITE_OTHER): Payer: 59 | Admitting: *Deleted

## 2022-03-11 DIAGNOSIS — J309 Allergic rhinitis, unspecified: Secondary | ICD-10-CM | POA: Diagnosis not present

## 2022-04-04 ENCOUNTER — Ambulatory Visit (INDEPENDENT_AMBULATORY_CARE_PROVIDER_SITE_OTHER): Payer: 59

## 2022-04-04 DIAGNOSIS — J309 Allergic rhinitis, unspecified: Secondary | ICD-10-CM | POA: Diagnosis not present

## 2022-04-22 ENCOUNTER — Ambulatory Visit (INDEPENDENT_AMBULATORY_CARE_PROVIDER_SITE_OTHER): Payer: 59

## 2022-04-22 DIAGNOSIS — J309 Allergic rhinitis, unspecified: Secondary | ICD-10-CM | POA: Diagnosis not present

## 2022-05-12 NOTE — Progress Notes (Deleted)
Follow Up Note  RE: Cindy [name] MRN: [id number] DOB: 01/25/1971 Date of Office Visit: 05/13/2022  Referring provider: Mila Palmer, MD Primary care provider: Mila Palmer, MD  Chief Complaint: No chief complaint on file.  History of Present Illness: I had the pleasure of seeing Cindy [name] for a follow up visit at the [facility] on 05/12/2022. She is a 51 y.o. female, who is being followed for allergic rhinitis on AIT and reactive airway disease. Her previous allergy office visit was on 01/14/2022 with Dr. Selena Batten. Today is a regular follow up visit.  Seasonal and perennial allergic rhinitis Past history - Perennial rhinitis symptoms for the past 40 years but worsening in the spring and winter.  Mood disorder with Zyrtec.  Tried Flonase and Astelin in the past with minimal improvement.  ENT evaluation over 10 years ago.  2 cats at home. 2021 skin testing showed: Positive to dust mites and cats, trees, mold, cockroach. Declines Singulair. Started AIT on 12/22/2019 (TCDM & MCR). Interim history - symptoms flared while traveling abroad and now has vertigo issues.  Continue allergy injections - given today.   Make sure you carry your Epipen on the days of your injections.  Continue environmental control measures May use carbinoxamine 4mg  1 to 1.5 tablet 1-2 times a day as needed.  Continue Nasacort 1 spray per nostril once a day for nasal congestion. If it causes nosebleeds then stop.  Use azelastine nasal spray 1-2 sprays per nostril twice a day as needed for runny nose/drainage. Nasal saline spray (i.e., Simply Saline) or nasal saline lavage (i.e., NeilMed) is recommended as needed and prior to medicated nasal sprays. Follow up with ENT and MRI as scheduled.    Reactive airway disease Past history - Albuterol causes palpitations.  Used to be on Symbicort for a short while in the past. 2021 spirometry showed some restriction with 6% improvement in FEV1 post  bronchodilator treatment.  She clinically felt better and liked Xopenex as it did not cause palpitation. Alvesco too expensive. Covid-19 in Feb 2023. Interim history - shortness of breath resolved. Stopped Arnuity in July. Now on prednisone for something else.  Unable to get spirometry due to mechanical issues with the machine. Daily controller medication(s): none.  May use levoalbuterol rescue inhaler 2 puffs every 4 to 6 hours as needed for shortness of breath, chest tightness, coughing, and wheezing. May use levoalbuterol rescue inhaler 2 puffs 5 to 15 minutes prior to strenuous physical activities. Monitor frequency of use.   Return in about 4 months (around 05/16/2022).  Assessment and Plan: [name] is a 51 y.o. female with: No problem-specific Assessment & Plan notes found for this encounter.  No follow-ups on file.  No orders of the defined types were placed in this encounter.  Lab Orders  No laboratory test(s) ordered today    Diagnostics: Spirometry:  Tracings reviewed. Her effort: {Blank single:19197::"Good reproducible efforts.","It was hard to get consistent efforts and there is a question as to whether this reflects a maximal maneuver.","Poor effort, data can not be interpreted."} FVC: ***L FEV1: ***L, ***% predicted FEV1/FVC ratio: ***% Interpretation: {Blank single:19197::"Spirometry consistent with mild obstructive disease","Spirometry consistent with moderate obstructive disease","Spirometry consistent with severe obstructive disease","Spirometry consistent with possible restrictive disease","Spirometry consistent with mixed obstructive and restrictive disease","Spirometry uninterpretable due to technique","Spirometry consistent with normal pattern","No overt abnormalities noted given today's efforts"}.  Please see scanned spirometry results for details.  Skin Testing: {Blank single:19197::"Select foods","Environmental allergy panel","Environmental allergy panel and select  foods","Food allergy panel","None","Deferred due to recent antihistamines use"}. *** Results discussed with patient/family.   Medication List:  Current Outpatient Medications  Medication Sig Dispense Refill   Ascorbic Acid (VITAMIN C) 500 MG CAPS Take 1 capsule by mouth daily at 12 noon.     azelastine (ASTELIN) 0.1 % nasal spray Place 1 spray into both nostrils 2 (two) times daily. Use in each nostril as directed     Carbinoxamine Maleate 4 MG TABS TAKE ONE AND ONE-HALF TABLETS BY MOUTH TWICE DAILY AS NEEDED for allergies 270 tablet 1   Cholecalciferol (VITAMIN D) 125 MCG (5000 UT) CAPS Take 1 capsule by mouth daily at 12 noon.     EPINEPHrine 0.3 mg/0.3 mL IJ SOAJ injection Inject 0.3 mLs (0.3 mg total) into the muscle as needed for anaphylaxis. 1 each 2   levalbuterol (XOPENEX HFA) 45 MCG/ACT inhaler Inhale 2 puffs into the lungs every 4 (four) hours as needed for wheezing or shortness of breath (coughing). 1 each 1   olmesartan (BENICAR) 40 MG tablet Take 40 mg by mouth daily.     ondansetron (ZOFRAN-ODT) 4 MG disintegrating tablet Take 1 tablet (4 mg total) by mouth every 8 (eight) hours as needed for up to 15 doses for nausea or vomiting. 15 tablet 0   predniSONE (STERAPRED UNI-PAK 21 TAB) 10 MG (21) TBPK tablet Take 1 tablet by mouth as directed.     triamcinolone (NASACORT) 55 MCG/ACT AERO nasal inhaler Place 1 spray into the nose daily.     No current facility-administered medications for this visit.   Allergies: Allergies  Allergen Reactions   Doxycycline Other (See Comments)   Iron Other (See Comments)   Minocycline Other (See Comments)   Other Other (See Comments)   I reviewed her past medical history, social history, family history, and environmental history and no significant changes have been reported from her previous visit.  Review of Systems  Constitutional:  Negative for appetite change, chills, fever and unexpected weight change.  HENT:  Positive for congestion  and postnasal drip. Negative for rhinorrhea.   Eyes:  Negative for itching.  Respiratory:  Negative for cough, chest tightness, shortness of breath and wheezing.   Cardiovascular:  Negative for chest pain.  Gastrointestinal:  Negative for abdominal pain and nausea.  Genitourinary:  Negative for difficulty urinating.  Skin:  Negative for rash.  Allergic/Immunologic: Positive for environmental allergies.  Neurological:  Positive for dizziness and light-headedness. Negative for headaches.    Objective: There were no vitals taken for this visit. There is no height or weight on file to calculate BMI. Physical Exam Vitals and nursing note reviewed.  Constitutional:      Appearance: Normal appearance. She is well-developed.  HENT:     Head: Normocephalic and atraumatic.     Right Ear: Tympanic membrane and external ear normal.     Left Ear: Tympanic membrane and external ear normal.     Nose: Nose normal.     Mouth/Throat:     Mouth: Mucous membranes are moist.     Pharynx: Oropharynx is clear.  Eyes:     Conjunctiva/sclera: Conjunctivae normal.  Cardiovascular:     Rate and Rhythm: Normal rate and regular rhythm.     Heart sounds: Normal heart sounds. No murmur heard.    No friction rub. No gallop.  Pulmonary:     Effort: Pulmonary effort is normal.     Breath sounds: Normal breath sounds. No wheezing or rales.  Musculoskeletal:  Cervical back: Neck supple.  Skin:    General: Skin is warm.     Findings: No rash.  Neurological:     Mental Status: She is alert and oriented to person, place, and time.  Psychiatric:        Behavior: Behavior normal.    Previous notes and tests were reviewed. The plan was reviewed with the patient/family, and all questions/concerned were addressed.  It was my pleasure to see Creta today and participate in her care. Please feel free to contact me with any questions or concerns.  Sincerely,  Wyline Mood, DO Allergy & Immunology  [facility] office: [phone] [facility] office: [phone]

## 2022-05-13 ENCOUNTER — Ambulatory Visit: Payer: 59 | Admitting: Allergy

## 2022-05-13 DIAGNOSIS — J309 Allergic rhinitis, unspecified: Secondary | ICD-10-CM

## 2022-05-13 DIAGNOSIS — J302 Other seasonal allergic rhinitis: Secondary | ICD-10-CM

## 2022-05-13 DIAGNOSIS — J4540 Moderate persistent asthma, uncomplicated: Secondary | ICD-10-CM

## 2022-05-15 ENCOUNTER — Ambulatory Visit (INDEPENDENT_AMBULATORY_CARE_PROVIDER_SITE_OTHER): Payer: 59

## 2022-05-15 DIAGNOSIS — J309 Allergic rhinitis, unspecified: Secondary | ICD-10-CM | POA: Diagnosis not present

## 2022-05-22 DIAGNOSIS — J3089 Other allergic rhinitis: Secondary | ICD-10-CM

## 2022-05-22 NOTE — Progress Notes (Signed)
VIALS EXP 05-23-23 

## 2022-05-30 DIAGNOSIS — J302 Other seasonal allergic rhinitis: Secondary | ICD-10-CM | POA: Diagnosis not present

## 2022-05-30 NOTE — Progress Notes (Signed)
Follow Up Note  RE: Cindy [name] MRN: [id number] DOB: 1970-05-29 Date of Office Visit: 05/31/2022  Referring provider: Jonathon Jordan, MD Primary care provider: Jonathon Jordan, MD  Chief Complaint: Follow-up (No concerns)  History of Present Illness: I had the pleasure of seeing Cindy [name] for a follow up visit at the [facility] on 05/31/2022. She is a 52 y.o. female, who is being followed for allergic rhinitis on AIT and reactive airway disease. Her previous allergy office visit was on 01/14/2022 with Dr. Maudie Mercury. Today is a regular follow up visit.  Seasonal and perennial allergic rhinitis Doing well with AIT with some localized reactions. Taking carbinoxamine 4mg  to 6mg  at night. She does not sure what would happen if she misses a dose.  ENT started her on [name] 1 spray per nostril which was not covered so she is taking Olopatadine and Nasacort 1 spray per nostril once a day.  MIR was normal.    Reactive airway disease Patient had flu last week and had to use Xopenex with good benefit.  Assessment and Plan: Cindy [name] is a 52 y.o. female with: Seasonal and perennial allergic rhinitis Past history - Perennial rhinitis symptoms for the past 40 years but worsening in the spring and winter.  Mood disorder with Zyrtec.  Tried Flonase and Astelin in the past with minimal improvement.  ENT evaluation over 10 years ago.  2 cats at home. 2021 skin testing showed: Positive to dust mites and cats, trees, mold, cockroach. Declines Singulair. Started AIT on 12/22/2019 (TCDM & MCR). Interim history - some localized itching with AIT. Saw ENT and normal MRI. [name] not covered.  Continue allergy injections.  Make sure you carry your Epipen on the days of your injections.  Continue environmental control measures May use carbinoxamine 4mg  1 to 1.5 tablet 1-2 times a day as needed.  May take less carbinoxamine and monitor symptoms.  Use Nasacort (triamcinolone) nasal spray 1 spray  per nostril twice a day as needed for nasal congestion.  May use olopatadine 0.6% 1-2 sprays per nostril twice a day as needed for runny nose/drainage.  Nasal saline spray (i.e., Simply Saline) or nasal saline lavage (i.e., NeilMed) is recommended as needed and prior to medicated nasal sprays.  Reactive airway disease Past history - Albuterol causes palpitations.  Used to be on Symbicort for a short while in the past. 2021 spirometry showed some restriction with 6% improvement in FEV1 post bronchodilator treatment.  She clinically felt better and liked Xopenex as it did not cause palpitation. Alvesco too expensive. Covid-19 in Feb 2023. Stopped Arnuity in July 2023.  Interim history - used xopenex when she had flu but now feeling better.  Daily controller medication(s): none.  May use levoalbuterol rescue inhaler 2 puffs every 4 to 6 hours as needed for shortness of breath, chest tightness, coughing, and wheezing. May use levoalbuterol rescue inhaler 2 puffs 5 to 15 minutes prior to strenuous physical activities. Monitor frequency of use.  Get spirometry at next visit.  Return in about 6 months (around 11/29/2022).  Meds ordered this encounter  Medications   EPINEPHrine 0.3 mg/0.3 mL IJ SOAJ injection    Sig: Inject 0.3 mg into the muscle as needed for anaphylaxis.    Dispense:  1 each    Refill:  2    May dispense generic/teva/mylan brand.   Carbinoxamine Maleate 4 MG TABS    Sig: TAKE ONE AND ONE-HALF TABLETS BY MOUTH TWICE DAILY AS NEEDED for allergies  Dispense:  270 tablet    Refill:  1    Please place on hold for when the patient needs to pick up.   levalbuterol (XOPENEX HFA) 45 MCG/ACT inhaler    Sig: Inhale 2 puffs into the lungs every 4 (four) hours as needed for wheezing or shortness of breath (coughing).    Dispense:  15 g    Refill:  1    Please place on hold for when the patient needs to pick up.   Lab Orders  No laboratory test(s) ordered today    Diagnostics: None.    Medication List:  Current Outpatient Medications  Medication Sig Dispense Refill   Cholecalciferol (VITAMIN D) 125 MCG (5000 UT) CAPS Take 1 capsule by mouth daily at 12 noon.     olmesartan (BENICAR) 40 MG tablet Take 40 mg by mouth daily.     Olopatadine HCl 0.6 % SOLN Place 2 sprays into both nostrils 2 (two) times daily.     triamcinolone (NASACORT) 55 MCG/ACT AERO nasal inhaler Place 1 spray into the nose daily.     Carbinoxamine Maleate 4 MG TABS TAKE ONE AND ONE-HALF TABLETS BY MOUTH TWICE DAILY AS NEEDED for allergies 270 tablet 1   EPINEPHrine 0.3 mg/0.3 mL IJ SOAJ injection Inject 0.3 mg into the muscle as needed for anaphylaxis. 1 each 2   levalbuterol (XOPENEX HFA) 45 MCG/ACT inhaler Inhale 2 puffs into the lungs every 4 (four) hours as needed for wheezing or shortness of breath (coughing). 15 g 1   No current facility-administered medications for this visit.   Allergies: Allergies  Allergen Reactions   Doxycycline Other (See Comments)   Iron Other (See Comments)   Minocycline Other (See Comments)   Other Other (See Comments)   I reviewed her past medical history, social history, family history, and environmental history and no significant changes have been reported from her previous visit.  Review of Systems  Constitutional:  Negative for appetite change, chills, fever and unexpected weight change.  HENT:  Positive for congestion and postnasal drip. Negative for rhinorrhea.   Eyes:  Negative for itching.  Respiratory:  Negative for cough, chest tightness, shortness of breath and wheezing.   Cardiovascular:  Negative for chest pain.  Gastrointestinal:  Negative for abdominal pain and nausea.  Genitourinary:  Negative for difficulty urinating.  Skin:  Negative for rash.  Allergic/Immunologic: Positive for environmental allergies.  Neurological:  Negative for headaches.    Objective: BP (!) 124/90 (BP Location: Left Arm, Patient Position: Sitting, Cuff Size: Normal)    Pulse 70   Temp 97.7 F (36.5 C) (Temporal)   Resp 20   Ht 5\' 1"  (1.549 m)   Wt 130 lb 4.8 oz (59.1 kg)   SpO2 97%   BMI 24.62 kg/m  Body mass index is 24.62 kg/m. Physical Exam Vitals and nursing note reviewed.  Constitutional:      Appearance: Normal appearance. She is well-developed.  HENT:     Head: Normocephalic and atraumatic.     Right Ear: Tympanic membrane and external ear normal.     Left Ear: Tympanic membrane and external ear normal.     Nose: Nose normal.     Mouth/Throat:     Mouth: Mucous membranes are moist.     Pharynx: Oropharynx is clear.  Eyes:     Conjunctiva/sclera: Conjunctivae normal.  Cardiovascular:     Rate and Rhythm: Normal rate and regular rhythm.     Heart sounds: Normal heart sounds. No  murmur heard.    No friction rub. No gallop.  Pulmonary:     Effort: Pulmonary effort is normal.     Breath sounds: Normal breath sounds. No wheezing or rales.  Musculoskeletal:     Cervical back: Neck supple.  Skin:    General: Skin is warm.     Findings: No rash.  Neurological:     Mental Status: She is alert and oriented to person, place, and time.  Psychiatric:        Behavior: Behavior normal.    Previous notes and tests were reviewed. The plan was reviewed with the patient/family, and all questions/concerned were addressed.  It was my pleasure to see Angelena today and participate in her care. Please feel free to contact me with any questions or concerns.  Sincerely,  Rexene Alberts, DO Allergy & Immunology  [facility] office: [place] office: [phone]

## 2022-05-31 ENCOUNTER — Ambulatory Visit (INDEPENDENT_AMBULATORY_CARE_PROVIDER_SITE_OTHER): Payer: 59 | Admitting: Allergy

## 2022-05-31 ENCOUNTER — Encounter: Payer: Self-pay | Admitting: Allergy

## 2022-05-31 ENCOUNTER — Other Ambulatory Visit: Payer: Self-pay

## 2022-05-31 VITALS — BP 124/90 | HR 70 | Temp 97.7°F | Resp 20 | Ht 61.0 in | Wt 130.3 lb

## 2022-05-31 DIAGNOSIS — J3089 Other allergic rhinitis: Secondary | ICD-10-CM

## 2022-05-31 DIAGNOSIS — J302 Other seasonal allergic rhinitis: Secondary | ICD-10-CM

## 2022-05-31 DIAGNOSIS — J4540 Moderate persistent asthma, uncomplicated: Secondary | ICD-10-CM | POA: Diagnosis not present

## 2022-05-31 MED ORDER — CARBINOXAMINE MALEATE 4 MG PO TABS: ORAL_TABLET | ORAL | 1 refills | Status: DC

## 2022-05-31 MED ORDER — EPINEPHRINE 0.3 MG/0.3ML IJ SOAJ
0.3000 mg | INTRAMUSCULAR | 2 refills | Status: DC | PRN
Start: 2022-05-31 — End: 2023-06-11

## 2022-05-31 MED ORDER — LEVALBUTEROL TARTRATE 45 MCG/ACT IN AERO: 2.0000 | INHALATION_SPRAY | RESPIRATORY_TRACT | 1 refills | Status: DC | PRN

## 2022-05-31 NOTE — Assessment & Plan Note (Signed)
Past history - Perennial rhinitis symptoms for the past 40 years but worsening in the spring and winter.  Mood disorder with Zyrtec.  Tried Flonase and Astelin in the past with minimal improvement.  ENT evaluation over 10 years ago.  2 cats at home. 2021 skin testing showed: Positive to dust mites and cats, trees, mold, cockroach. Declines Singulair. Started AIT on 12/22/2019 (TCDM & MCR). Interim history - some localized itching with AIT. Saw ENT and normal MRI. [name] not covered.  Continue allergy injections.  Make sure you carry your Epipen on the days of your injections.  Continue environmental control measures May use carbinoxamine 4mg  1 to 1.5 tablet 1-2 times a day as needed.  May take less carbinoxamine and monitor symptoms.  Use Nasacort (triamcinolone) nasal spray 1 spray per nostril twice a day as needed for nasal congestion.  May use olopatadine 0.6% 1-2 sprays per nostril twice a day as needed for runny nose/drainage.  Nasal saline spray (i.e., Simply Saline) or nasal saline lavage (i.e., NeilMed) is recommended as needed and prior to medicated nasal sprays.

## 2022-05-31 NOTE — Patient Instructions (Addendum)
Allergic rhinitis Past skin testing showed: Positive to dust mites and cats, trees, mold, cockroach. Continue allergy injections.  Make sure you carry your Epipen on the days of your injections.  Continue environmental control measures May use carbinoxamine 4mg  1 to 1.5 tablet 1-2 times a day as needed.  May take less carbinoxamine and monitor symptoms.  Use Nasacort (triamcinolone) nasal spray 1 spray per nostril twice a day as needed for nasal congestion.  May use olopatadine 0.6% 1-2 sprays per nostril twice a day as needed for runny nose/drainage.  Nasal saline spray (i.e., Simply Saline) or nasal saline lavage (i.e., NeilMed) is recommended as needed and prior to medicated nasal sprays.  Reactive airway disease Daily controller medication(s): none.  May use levoalbuterol rescue inhaler 2 puffs every 4 to 6 hours as needed for shortness of breath, chest tightness, coughing, and wheezing. May use levoalbuterol rescue inhaler 2 puffs 5 to 15 minutes prior to strenuous physical activities. Monitor frequency of use.  Breathing control goals:  Full participation in all desired activities (may need albuterol before activity) Albuterol use two times or less a week on average (not counting use with activity) Cough interfering with sleep two times or less a month Oral steroids no more than once a year No hospitalizations   Follow up in 6 months or sooner if needed.

## 2022-05-31 NOTE — Assessment & Plan Note (Signed)
Past history - Albuterol causes palpitations.  Used to be on Symbicort for a short while in the past. 2021 spirometry showed some restriction with 6% improvement in FEV1 post bronchodilator treatment.  She clinically felt better and liked Xopenex as it did not cause palpitation. Alvesco too expensive. Covid-19 in Feb 2023. Stopped Arnuity in July 2023.  Interim history - used xopenex when she had flu but now feeling better.  Daily controller medication(s): none.  May use levoalbuterol rescue inhaler 2 puffs every 4 to 6 hours as needed for shortness of breath, chest tightness, coughing, and wheezing. May use levoalbuterol rescue inhaler 2 puffs 5 to 15 minutes prior to strenuous physical activities. Monitor frequency of use.  Get spirometry at next visit.

## 2022-06-11 ENCOUNTER — Ambulatory Visit (INDEPENDENT_AMBULATORY_CARE_PROVIDER_SITE_OTHER): Payer: 59

## 2022-06-11 DIAGNOSIS — J309 Allergic rhinitis, unspecified: Secondary | ICD-10-CM | POA: Diagnosis not present

## 2022-07-02 ENCOUNTER — Ambulatory Visit (INDEPENDENT_AMBULATORY_CARE_PROVIDER_SITE_OTHER): Payer: 59

## 2022-07-02 DIAGNOSIS — J309 Allergic rhinitis, unspecified: Secondary | ICD-10-CM

## 2022-07-23 ENCOUNTER — Ambulatory Visit (INDEPENDENT_AMBULATORY_CARE_PROVIDER_SITE_OTHER): Payer: 59

## 2022-07-23 DIAGNOSIS — J309 Allergic rhinitis, unspecified: Secondary | ICD-10-CM

## 2022-07-30 ENCOUNTER — Ambulatory Visit (INDEPENDENT_AMBULATORY_CARE_PROVIDER_SITE_OTHER): Payer: 59

## 2022-07-30 DIAGNOSIS — J309 Allergic rhinitis, unspecified: Secondary | ICD-10-CM | POA: Diagnosis not present

## 2022-08-06 ENCOUNTER — Ambulatory Visit (INDEPENDENT_AMBULATORY_CARE_PROVIDER_SITE_OTHER): Payer: 59

## 2022-08-06 DIAGNOSIS — J309 Allergic rhinitis, unspecified: Secondary | ICD-10-CM

## 2022-08-14 ENCOUNTER — Ambulatory Visit (INDEPENDENT_AMBULATORY_CARE_PROVIDER_SITE_OTHER): Payer: 59

## 2022-08-14 DIAGNOSIS — J309 Allergic rhinitis, unspecified: Secondary | ICD-10-CM | POA: Diagnosis not present

## 2022-08-22 ENCOUNTER — Ambulatory Visit (INDEPENDENT_AMBULATORY_CARE_PROVIDER_SITE_OTHER): Payer: 59 | Admitting: *Deleted

## 2022-08-22 DIAGNOSIS — J309 Allergic rhinitis, unspecified: Secondary | ICD-10-CM | POA: Diagnosis not present

## 2022-09-11 ENCOUNTER — Ambulatory Visit (INDEPENDENT_AMBULATORY_CARE_PROVIDER_SITE_OTHER): Payer: 59

## 2022-09-11 DIAGNOSIS — J309 Allergic rhinitis, unspecified: Secondary | ICD-10-CM | POA: Diagnosis not present

## 2022-09-30 ENCOUNTER — Ambulatory Visit (INDEPENDENT_AMBULATORY_CARE_PROVIDER_SITE_OTHER): Payer: 59 | Admitting: *Deleted

## 2022-09-30 DIAGNOSIS — J309 Allergic rhinitis, unspecified: Secondary | ICD-10-CM

## 2022-10-22 ENCOUNTER — Ambulatory Visit (INDEPENDENT_AMBULATORY_CARE_PROVIDER_SITE_OTHER): Payer: 59 | Admitting: *Deleted

## 2022-10-22 DIAGNOSIS — J309 Allergic rhinitis, unspecified: Secondary | ICD-10-CM | POA: Diagnosis not present

## 2022-11-12 ENCOUNTER — Ambulatory Visit (INDEPENDENT_AMBULATORY_CARE_PROVIDER_SITE_OTHER): Payer: 59 | Admitting: *Deleted

## 2022-11-12 DIAGNOSIS — J309 Allergic rhinitis, unspecified: Secondary | ICD-10-CM | POA: Diagnosis not present

## 2022-12-10 ENCOUNTER — Ambulatory Visit (INDEPENDENT_AMBULATORY_CARE_PROVIDER_SITE_OTHER): Payer: 59

## 2022-12-10 DIAGNOSIS — J309 Allergic rhinitis, unspecified: Secondary | ICD-10-CM | POA: Diagnosis not present

## 2022-12-19 DIAGNOSIS — J302 Other seasonal allergic rhinitis: Secondary | ICD-10-CM

## 2022-12-19 NOTE — Progress Notes (Signed)
VIALS EXP 12-19-23

## 2023-01-06 DIAGNOSIS — J3081 Allergic rhinitis due to animal (cat) (dog) hair and dander: Secondary | ICD-10-CM

## 2023-01-13 ENCOUNTER — Ambulatory Visit (INDEPENDENT_AMBULATORY_CARE_PROVIDER_SITE_OTHER): Payer: 59

## 2023-01-13 DIAGNOSIS — J309 Allergic rhinitis, unspecified: Secondary | ICD-10-CM

## 2023-02-10 ENCOUNTER — Ambulatory Visit (INDEPENDENT_AMBULATORY_CARE_PROVIDER_SITE_OTHER): Payer: 59

## 2023-02-10 DIAGNOSIS — J309 Allergic rhinitis, unspecified: Secondary | ICD-10-CM

## 2023-02-18 ENCOUNTER — Ambulatory Visit (INDEPENDENT_AMBULATORY_CARE_PROVIDER_SITE_OTHER): Payer: 59

## 2023-02-18 DIAGNOSIS — J309 Allergic rhinitis, unspecified: Secondary | ICD-10-CM | POA: Diagnosis not present

## 2023-02-26 ENCOUNTER — Ambulatory Visit (INDEPENDENT_AMBULATORY_CARE_PROVIDER_SITE_OTHER): Payer: 59 | Admitting: *Deleted

## 2023-02-26 DIAGNOSIS — J309 Allergic rhinitis, unspecified: Secondary | ICD-10-CM | POA: Diagnosis not present

## 2023-03-26 ENCOUNTER — Ambulatory Visit (INDEPENDENT_AMBULATORY_CARE_PROVIDER_SITE_OTHER): Payer: 59 | Admitting: *Deleted

## 2023-03-26 DIAGNOSIS — J309 Allergic rhinitis, unspecified: Secondary | ICD-10-CM | POA: Diagnosis not present

## 2023-04-23 ENCOUNTER — Ambulatory Visit (INDEPENDENT_AMBULATORY_CARE_PROVIDER_SITE_OTHER): Payer: 59

## 2023-04-23 DIAGNOSIS — J309 Allergic rhinitis, unspecified: Secondary | ICD-10-CM | POA: Diagnosis not present

## 2023-05-27 ENCOUNTER — Ambulatory Visit (INDEPENDENT_AMBULATORY_CARE_PROVIDER_SITE_OTHER): Payer: 59 | Admitting: *Deleted

## 2023-05-27 DIAGNOSIS — J309 Allergic rhinitis, unspecified: Secondary | ICD-10-CM

## 2023-06-10 NOTE — Progress Notes (Signed)
 Follow Up Note  RE: Cindy [name] MRN: [id number] DOB: [birth year] Date of Office Visit: 06/11/2023  Referring provider: Olin Bertin, MD Primary care provider: Olin Bertin, MD  Chief Complaint: Follow-up and Medication Refill  History of Present Illness: I had the pleasure of seeing Cindy [name] for a follow up visit at the [facility] on 06/11/2023. She is a 53 y.o. female, who is being followed for allergic rhinitis on AIT and reactive airway disease. Her previous allergy office visit was on 05/31/2022 with Dr. Burdette Carolin. Today is a regular follow up visit.  Discussed the use of AI scribe software for clinical note transcription with the patient, who gave verbal consent to proceed.  The patient has been receiving allergy shots every four weeks. She reports that one of the shots, believed to be for dust mites, causes itchiness that lasts for a day before subsiding. She is unsure about the expiration date of her EpiPen .  The patient has been experiencing post-nasal drip, for which she has been taking carbinoxamine , usually one and a half tablets before sleep. She has also been using Nasacort and olopatadine  nasal sprays at night, but recently stopped due to nasal dryness, a common issue in the winter season. She has been using saline spray to alleviate the dryness.  The patient also has an inhaler (Levo albuterol) for occasional use. She reported using it once in the past month due to coughing and wheezing after experiencing what she believes was reflux. She is unsure about the expiration date of the inhaler.      Assessment and Plan: [name] is a 52 y.o. female with: Seasonal allergic rhinitis due to pollen Allergic rhinitis due to animal dander Allergic rhinitis due to dust mite Allergic rhinitis due to mold Allergy to cockroaches Past history - Mood disorder with Zyrtec, declined Singulair.  2 cats at home. 2021 skin testing positive to dust mites and cats, trees, mold,  cockroach. Started AIT on 12/22/2019 (TCDM & MCR). Saw ENT and normal MRI. [name] not covered.  Interim history - Reports itchiness with dust mite and pollen shot. Post nasal drip managed with Carbinoxamine  1-1.5 tablets at night and nasal sprays (Nasacort and olopatadine ) as needed.  Continue allergy injections - every 4 weeks.  Make sure you carry your Epipen  on the days of your injections.  Continue environmental control measures May use carbinoxamine  4mg  1 to 1.5 tablet 1-2 times a day as needed.  May use Nasacort (triamcinolone) nasal spray 1 spray per nostril twice a day as needed for nasal congestion.  May use olopatadine  0.6% 1-2 sprays per nostril twice a day as needed for runny nose/drainage.  Nasal saline spray (i.e., Simply Saline) or nasal saline lavage (i.e., NeilMed) is recommended as needed and prior to medicated nasal sprays.  Nose Bleeds: Preventative treatment: Apply saline nasal gel in each nostril twice a day for 2 weeks to allow the nasal mucosa to heal Consider using a humidifier in the winter Try to keep your blood pressure as normal as possible (120/80)  Mild intermittent reactive airway disease Past history - Albuterol causes palpitations.  Used to be on Symbicort for a short while in the past. 2021 spirometry showed some restriction with 6% improvement in FEV1 post bronchodilator treatment.  She clinically felt better and liked Xopenex  as it did not cause palpitation. Alvesco too expensive. Covid-19 in Feb 2023. Stopped Arnuity in July 2023.  Interim history - rare inhaler use.  Today's spirometry normal. May use levoalbuterol  rescue inhaler 2 puffs every 4 to 6 hours as needed for shortness of breath, chest tightness, coughing, and wheezing.  Monitor frequency of use - if you need to use it more than twice per week on a consistent basis let us  know.   Return in about 1 year (around 06/10/2024).  Meds ordered this encounter  Medications   Carbinoxamine  Maleate 4  MG TABS    Sig: TAKE ONE AND ONE-HALF TABLETS BY MOUTH TWICE DAILY AS NEEDED for allergies    Dispense:  270 tablet    Refill:  1    Please place on hold for when the patient needs to pick up.   EPINEPHrine  0.3 mg/0.3 mL IJ SOAJ injection    Sig: Inject 0.3 mg into the muscle as needed for anaphylaxis.    Dispense:  1 each    Refill:  2    May dispense generic/teva/mylan brand. Please put Rx on file until ready for pick up.   Olopatadine  HCl 0.6 % SOLN    Sig: Place 1-2 sprays into both nostrils 2 (two) times daily as needed (drainage).    Dispense:  30.5 g    Refill:  5   levalbuterol  (XOPENEX  HFA) 45 MCG/ACT inhaler    Sig: Inhale 2 puffs into the lungs every 4 (four) hours as needed for wheezing or shortness of breath (coughing).    Dispense:  15 g    Refill:  1    Please place on hold for when the patient needs to pick up.   Lab Orders  No laboratory test(s) ordered today    Diagnostics: Spirometry:  Tracings reviewed. Her effort: Good reproducible efforts. FVC: 2.18L FEV1: 1.61L, 75% predicted FEV1/FVC ratio: 74% Interpretation: Spirometry consistent with normal pattern.  Please see scanned spirometry results for details. Results discussed with patient/family.   Medication List:  Current Outpatient Medications  Medication Sig Dispense Refill   Cholecalciferol (VITAMIN D) 125 MCG (5000 UT) CAPS Take 1 capsule by mouth daily at 12 noon.     olmesartan (BENICAR) 40 MG tablet Take 40 mg by mouth daily.     triamcinolone (NASACORT) 55 MCG/ACT AERO nasal inhaler Place 1 spray into the nose daily.     Carbinoxamine  Maleate 4 MG TABS TAKE ONE AND ONE-HALF TABLETS BY MOUTH TWICE DAILY AS NEEDED for allergies 270 tablet 1   EPINEPHrine  0.3 mg/0.3 mL IJ SOAJ injection Inject 0.3 mg into the muscle as needed for anaphylaxis. 1 each 2   levalbuterol  (XOPENEX  HFA) 45 MCG/ACT inhaler Inhale 2 puffs into the lungs every 4 (four) hours as needed for wheezing or shortness of breath  (coughing). 15 g 1   Olopatadine  HCl 0.6 % SOLN Place 1-2 sprays into both nostrils 2 (two) times daily as needed (drainage). 30.5 g 5   No current facility-administered medications for this visit.   Allergies: Allergies  Allergen Reactions   Doxycycline Other (See Comments)   Iron Other (See Comments)   Minocycline Other (See Comments)   Other Other (See Comments)   I reviewed her past medical history, social history, family history, and environmental history and no significant changes have been reported from her previous visit.  Review of Systems  Constitutional:  Negative for appetite change, chills, fever and unexpected weight change.  HENT:  Positive for postnasal drip. Negative for congestion and rhinorrhea.   Eyes:  Negative for itching.  Respiratory:  Negative for cough, chest tightness, shortness of breath and wheezing.   Cardiovascular:  Negative for chest  pain.  Gastrointestinal:  Negative for abdominal pain and nausea.  Genitourinary:  Negative for difficulty urinating.  Skin:  Negative for rash.  Allergic/Immunologic: Positive for environmental allergies.  Neurological:  Negative for headaches.    Objective: BP 124/80   Pulse 75   Temp 98.4 F (36.9 C) (Oral)   Resp 16   Ht 5' (1.524 m)   Wt 135 lb 8 oz (61.5 kg)   SpO2 98%   BMI 26.46 kg/m  Body mass index is 26.46 kg/m. Physical Exam Vitals and nursing note reviewed.  Constitutional:      Appearance: Normal appearance. She is well-developed.  HENT:     Head: Normocephalic and atraumatic.     Right Ear: Tympanic membrane and external ear normal.     Left Ear: Tympanic membrane and external ear normal.     Nose:     Comments: Dry mucous in nares b/l.    Mouth/Throat:     Mouth: Mucous membranes are moist.     Pharynx: Oropharynx is clear.  Eyes:     Conjunctiva/sclera: Conjunctivae normal.  Cardiovascular:     Rate and Rhythm: Normal rate and regular rhythm.     Heart sounds: Normal heart sounds.  No murmur heard.    No friction rub. No gallop.  Pulmonary:     Effort: Pulmonary effort is normal.     Breath sounds: Normal breath sounds. No wheezing or rales.  Musculoskeletal:     Cervical back: Neck supple.  Skin:    General: Skin is warm.     Findings: No rash.  Neurological:     Mental Status: She is alert and oriented to person, place, and time.  Psychiatric:        Behavior: Behavior normal.   Previous notes and tests were reviewed. The plan was reviewed with the patient/family, and all questions/concerned were addressed.  It was my pleasure to see Abygayle today and participate in her care. Please feel free to contact me with any questions or concerns.  Sincerely,  Eudelia Hero, DO Allergy & Immunology  [facility]  [place] office: [phone] [facility] office: [phone]

## 2023-06-11 ENCOUNTER — Encounter: Payer: Self-pay | Admitting: Allergy

## 2023-06-11 ENCOUNTER — Ambulatory Visit (INDEPENDENT_AMBULATORY_CARE_PROVIDER_SITE_OTHER): Payer: 59 | Admitting: Allergy

## 2023-06-11 ENCOUNTER — Other Ambulatory Visit: Payer: Self-pay

## 2023-06-11 VITALS — BP 124/80 | HR 75 | Temp 98.4°F | Resp 16 | Ht 60.0 in | Wt 135.5 lb

## 2023-06-11 DIAGNOSIS — J3089 Other allergic rhinitis: Secondary | ICD-10-CM

## 2023-06-11 DIAGNOSIS — J3081 Allergic rhinitis due to animal (cat) (dog) hair and dander: Secondary | ICD-10-CM | POA: Diagnosis not present

## 2023-06-11 DIAGNOSIS — J301 Allergic rhinitis due to pollen: Secondary | ICD-10-CM

## 2023-06-11 DIAGNOSIS — Z91038 Other insect allergy status: Secondary | ICD-10-CM

## 2023-06-11 DIAGNOSIS — J4520 Mild intermittent asthma, uncomplicated: Secondary | ICD-10-CM

## 2023-06-11 MED ORDER — LEVALBUTEROL TARTRATE 45 MCG/ACT IN AERO: 2.0000 | INHALATION_SPRAY | RESPIRATORY_TRACT | 1 refills | Status: DC | PRN

## 2023-06-11 MED ORDER — EPINEPHRINE 0.3 MG/0.3ML IJ SOAJ
0.3000 mg | INTRAMUSCULAR | 2 refills | Status: DC | PRN
Start: 2023-06-11 — End: 2024-02-06

## 2023-06-11 MED ORDER — OLOPATADINE HCL 0.6 % NA SOLN: 1.0000 | Freq: Two times a day (BID) | NASAL | 5 refills | Status: AC | PRN

## 2023-06-11 MED ORDER — LEVALBUTEROL TARTRATE 45 MCG/ACT IN AERO: 2.0000 | INHALATION_SPRAY | RESPIRATORY_TRACT | 1 refills | Status: AC | PRN

## 2023-06-11 MED ORDER — CARBINOXAMINE MALEATE 4 MG PO TABS: ORAL_TABLET | ORAL | 1 refills | Status: AC

## 2023-06-11 NOTE — Patient Instructions (Addendum)
 Allergic rhinitis Past skin testing positive to dust mites and cats, trees, mold, cockroach. Continue allergy injections - every 4 weeks.  Make sure you carry your Epipen  on the days of your injections.  Continue environmental control measures May use carbinoxamine  4mg  1 to 1.5 tablet 1-2 times a day as needed.  May use Nasacort (triamcinolone) nasal spray 1 spray per nostril twice a day as needed for nasal congestion.  May use olopatadine  0.6% 1-2 sprays per nostril twice a day as needed for runny nose/drainage.  Nasal saline spray (i.e., Simply Saline) or nasal saline lavage (i.e., NeilMed) is recommended as needed and prior to medicated nasal sprays.  Nose Bleeds: Nosebleeds are very common.  Site of the bleeding is typically on the septum or at the very front of the nose.  Some of the more common causes are from trauma, inflammation or medication induced. Pinch both nostrils while leaning forward for at least 5 minutes before checking to see if the bleeding has stopped. If bleeding is not controlled within 5-10 minutes apply a cotton ball soaked with oxymetazoline (Afrin) to the bleeding nostril for a few seconds.  Preventative treatment: Apply saline nasal gel in each nostril twice a day for 2 weeks to allow the nasal mucosa to heal Consider using a humidifier in the winter Try to keep your blood pressure as normal as possible (120/80)  Reactive airway disease May use levoalbuterol rescue inhaler 2 puffs every 4 to 6 hours as needed for shortness of breath, chest tightness, coughing, and wheezing.  Monitor frequency of use - if you need to use it more than twice per week on a consistent basis let us  know.  Breathing control goals:  Full participation in all desired activities (may need albuterol before activity) Albuterol use two times or less a week on average (not counting use with activity) Cough interfering with sleep two times or less a month Oral steroids no more than once a  year No hospitalizations   Follow up in 12 months or sooner if needed.

## 2023-06-11 NOTE — Addendum Note (Signed)
 Addended by: Leone Ralphs D on: 06/11/2023 01:34 PM   Modules accepted: Orders

## 2023-06-24 ENCOUNTER — Ambulatory Visit (INDEPENDENT_AMBULATORY_CARE_PROVIDER_SITE_OTHER): Payer: 59 | Admitting: *Deleted

## 2023-06-24 DIAGNOSIS — J309 Allergic rhinitis, unspecified: Secondary | ICD-10-CM | POA: Diagnosis not present

## 2023-07-23 ENCOUNTER — Other Ambulatory Visit: Payer: Self-pay

## 2023-07-23 ENCOUNTER — Encounter (HOSPITAL_BASED_OUTPATIENT_CLINIC_OR_DEPARTMENT_OTHER): Payer: Self-pay | Admitting: Emergency Medicine

## 2023-07-23 ENCOUNTER — Emergency Department (HOSPITAL_BASED_OUTPATIENT_CLINIC_OR_DEPARTMENT_OTHER): Payer: 59

## 2023-07-23 ENCOUNTER — Emergency Department (HOSPITAL_BASED_OUTPATIENT_CLINIC_OR_DEPARTMENT_OTHER)
Admission: EM | Admit: 2023-07-23 | Discharge: 2023-07-23 | Disposition: A | Discharge status: Home / Self Care | Payer: 59 | Attending: Emergency Medicine | Admitting: Emergency Medicine

## 2023-07-23 DIAGNOSIS — M25561 Pain in right knee: Secondary | ICD-10-CM | POA: Insufficient documentation

## 2023-07-23 DIAGNOSIS — J45909 Unspecified asthma, uncomplicated: Secondary | ICD-10-CM | POA: Diagnosis not present

## 2023-07-23 DIAGNOSIS — Z79899 Other long term (current) drug therapy: Secondary | ICD-10-CM | POA: Diagnosis not present

## 2023-07-23 DIAGNOSIS — I10 Essential (primary) hypertension: Secondary | ICD-10-CM | POA: Insufficient documentation

## 2023-07-23 MED ORDER — NAPROXEN 500 MG PO TABS
500.0000 mg | ORAL_TABLET | Freq: Two times a day (BID) | ORAL | 0 refills | Status: AC
Start: 2023-07-23 — End: ?

## 2023-07-23 NOTE — Discharge Instructions (Addendum)
 You have been seen today for your complaint of right knee pain. Your imaging showed some mild arthritis. Your discharge medications include Alternate tylenol and ibuprofen for pain. You may alternate these every 4 hours. You may take up to 800 mg of ibuprofen at a time and up to 1000 mg of tylenol. Home care instructions are as follows:  Rest, ice the knee numerous times per day, elevate the knee above the level of the heart and apply the Ace wrap when resting.  Use the crutches as needed for walking Follow up with: Dr. Linna Caprice.  He is an Investment banker, operational.  Call to schedule a follow-up appointment. Please seek immediate medical care if you develop any of the following symptoms: Any new or worsening symptoms At this time there does not appear to be the presence of an emergent medical condition, however there is always the potential for conditions to change. Please read and follow the below instructions.  Do not take your medicine if  develop an itchy rash, swelling in your mouth or lips, or difficulty breathing; call 911 and seek immediate emergency medical attention if this occurs.  You may review your lab tests and imaging results in their entirety on your MyChart account.  Please discuss all results of fully with your primary care provider and other specialist at your follow-up visit.  Note: Portions of this text may have been transcribed using voice recognition software. Every effort was made to ensure accuracy; however, inadvertent computerized transcription errors may still be present.

## 2023-07-23 NOTE — ED Triage Notes (Signed)
 Rt knee pain x several days went on walk and it started to hurt worse now cannot bear weight

## 2023-07-23 NOTE — ED Provider Notes (Signed)
 [place] EMERGENCY DEPARTMENT AT [facility] Provider Note   CSN: [id number] Arrival date & time: 07/23/23  1633     History  Chief Complaint  Patient presents with   Knee Pain    Cindy [name] is a 53 y.o. female.  With history of asthma, hypertension presenting to the ED for evaluation of right knee pain.  She states the pain began approximately 2 days ago.  This morning at approximately 2 PM the pain significantly worsened.  There is no pain at rest.  She says she has been unable to bear weight since 2 PM today.  Pain is localized to the anterior medial aspect of the knee.  She denies any ankle pain or hip pain.  No recent trauma or falls.  States she has decreased her activity over the past 2 weeks.  She denies any rashes.  No urinary complaints.  No fevers or chills, nausea or vomiting.  No numbness, weakness or tingling.   Knee Pain      Home Medications Prior to Admission medications   Medication Sig Start Date End Date Taking? Authorizing Provider  naproxen (NAPROSYN) 500 MG tablet Take 1 tablet (500 mg total) by mouth 2 (two) times daily. 07/23/23  Yes Salahuddin Arismendez, Edsel Petrin, PA-C  Carbinoxamine Maleate 4 MG TABS TAKE ONE AND ONE-HALF TABLETS BY MOUTH TWICE DAILY AS NEEDED for allergies 06/11/23   Ellamae Sia, DO  Cholecalciferol (VITAMIN D) 125 MCG (5000 UT) CAPS Take 1 capsule by mouth daily at 12 noon.    [provider]  EPINEPHrine 0.3 mg/0.3 mL IJ SOAJ injection Inject 0.3 mg into the muscle as needed for anaphylaxis. 06/11/23   Ellamae Sia, DO  levalbuterol [facility] HFA) 45 MCG/ACT inhaler Inhale 2 puffs into the lungs every 4 (four) hours as needed for wheezing or shortness of breath (coughing). 06/11/23   Ellamae Sia, DO  olmesartan (BENICAR) 40 MG tablet Take 40 mg by mouth daily.    [provider]  Olopatadine HCl 0.6 % SOLN Place 1-2 sprays into both nostrils 2 (two) times daily as needed (drainage). 06/11/23   Ellamae Sia, DO  triamcinolone  (NASACORT) 55 MCG/ACT AERO nasal inhaler Place 1 spray into the nose daily.    [provider]      Allergies    Doxycycline, Iron, Minocycline, and Other    Review of Systems   Review of Systems  Physical Exam Updated Vital Signs BP (!) 144/86 (BP Location: Left Arm)   Pulse 84   Temp 98.2 F (36.8 C) (Oral)   Resp 16   Ht 5\' 1"  (1.549 m)   Wt 59 kg   SpO2 100%   BMI 24.56 kg/m  Physical Exam Vitals and nursing note reviewed.  Constitutional:      General: She is not in acute distress.    Appearance: Normal appearance. She is normal weight. She is not ill-appearing.  HENT:     Head: Normocephalic and atraumatic.  Pulmonary:     Effort: Pulmonary effort is normal. No respiratory distress.  Abdominal:     General: Abdomen is flat.  Musculoskeletal:     Cervical back: Neck supple.     Comments: Moderate swelling to the right knee when compared contralaterally.  No overlying skin changes.  No open wounds.  No warmth.  TTP to the pes anserine area of the right knee.  Increased pain with knee extension.  DP pulse 2+.  Sensation intact distally.  Compartments are  soft.  Homans test negative.  Skin:    General: Skin is warm and dry.  Neurological:     Mental Status: She is alert and oriented to person, place, and time.  Psychiatric:        Mood and Affect: Mood normal.        Behavior: Behavior normal.     ED Results / Procedures / Treatments   Labs (all labs ordered are listed, but only abnormal results are displayed) Labs Reviewed - No data to display  EKG None  Radiology DG Knee 2 Views Right Result Date: 07/23/2023 CLINICAL DATA:  Pain. Right knee pain beginning several days ago during a walk. Worsening now. Unable to bear weight. EXAM: RIGHT KNEE - 1-2 VIEW COMPARISON:  None Available. FINDINGS: Mild degenerative changes in the right knee with mild medial compartment narrowing and slight osteophyte formation in the medial and lateral compartments. No  evidence of acute fracture or dislocation. No focal bone lesion or bone destruction. No significant effusions. Soft tissues are unremarkable. IMPRESSION: Minimal degenerative change in the right knee. No acute bony abnormalities. Electronically Signed   By: Burman Nieves M.D.   On: 07/23/2023 18:14    Procedures Procedures    Medications Ordered in ED Medications - No data to display  ED Course/ Medical Decision Making/ A&P                                 Medical Decision Making Amount and/or Complexity of Data Reviewed Radiology: ordered.  This patient presents to the ED for concern of right knee pain, this involves an extensive number of treatment options, and is a complaint that carries with it a high risk of complications and morbidity.  The differential diagnosis includes fracture, strain, sprain, OA, RA, septic arthritis, bursitis  My initial workup includes imaging.  Patient Clines pain control  Additional history obtained from: Nursing notes from this visit.  I ordered imaging studies including x-ray right knee I independently visualized and interpreted imaging which showed mild degenerative changes of the right knee I agree with the radiologist interpretation  Afebrile, hemodynamically stable.  53 year old female presenting to the ED for evaluation of atraumatic right knee pain.  This began 2 days ago and worsened earlier today.  Pain is present only with ambulation, no pain at rest.  Pain is localized specifically to the anterior medial aspect of the knee, overlying the pes anserine bursa.  Suspect pes bursitis.  X-ray negative for acute osseous abnormalities.  No overlying erythema or warmth.  No history of penetrating trauma.  Very low suspicion for septic arthritis.  Patient was given an Ace wrap in the emergency department.  She was given crutches for ambulation.  She was educated on RICE therapy.  She was given contact information for orthopedics and encouraged to  follow-up.  She was given return precautions.  Stable at discharge.  At this time there does not appear to be any evidence of an acute emergency medical condition and the patient appears stable for discharge with appropriate outpatient follow up. Diagnosis was discussed with patient who verbalizes understanding of care plan and is agreeable to discharge. I have discussed return precautions with patient and husband at bedside who verbalizes understanding. Patient encouraged to follow-up with orthopedics. All questions answered.  Note: Portions of this report may have been transcribed using voice recognition software. Every effort was made to ensure accuracy; however, inadvertent computerized transcription  errors may still be present.        Final Clinical Impression(s) / ED Diagnoses Final diagnoses:  Acute pain of right knee    Rx / DC Orders ED Discharge Orders          Ordered    naproxen (NAPROSYN) 500 MG tablet  2 times daily        07/23/23 1828              Michelle Piper, PA-C 07/23/23 1829    Franne Forts, DO 07/24/23 2016

## 2023-07-28 ENCOUNTER — Ambulatory Visit (INDEPENDENT_AMBULATORY_CARE_PROVIDER_SITE_OTHER): Admitting: *Deleted

## 2023-07-28 DIAGNOSIS — J309 Allergic rhinitis, unspecified: Secondary | ICD-10-CM

## 2023-08-22 ENCOUNTER — Ambulatory Visit (INDEPENDENT_AMBULATORY_CARE_PROVIDER_SITE_OTHER): Payer: Self-pay

## 2023-08-22 DIAGNOSIS — J309 Allergic rhinitis, unspecified: Secondary | ICD-10-CM | POA: Diagnosis not present

## 2023-08-26 DIAGNOSIS — J3089 Other allergic rhinitis: Secondary | ICD-10-CM | POA: Diagnosis not present

## 2023-08-26 NOTE — Progress Notes (Signed)
 VIALS MADE 08-26-23

## 2023-09-22 ENCOUNTER — Ambulatory Visit (INDEPENDENT_AMBULATORY_CARE_PROVIDER_SITE_OTHER)

## 2023-09-22 DIAGNOSIS — J309 Allergic rhinitis, unspecified: Secondary | ICD-10-CM

## 2023-10-02 ENCOUNTER — Other Ambulatory Visit: Payer: Self-pay

## 2023-10-02 ENCOUNTER — Encounter (HOSPITAL_BASED_OUTPATIENT_CLINIC_OR_DEPARTMENT_OTHER): Payer: Self-pay | Admitting: Physical Therapy

## 2023-10-02 ENCOUNTER — Ambulatory Visit (HOSPITAL_BASED_OUTPATIENT_CLINIC_OR_DEPARTMENT_OTHER): Attending: Sports Medicine | Admitting: Physical Therapy

## 2023-10-02 DIAGNOSIS — R2689 Other abnormalities of gait and mobility: Secondary | ICD-10-CM | POA: Insufficient documentation

## 2023-10-02 DIAGNOSIS — M6281 Muscle weakness (generalized): Secondary | ICD-10-CM | POA: Diagnosis present

## 2023-10-02 DIAGNOSIS — M25561 Pain in right knee: Secondary | ICD-10-CM | POA: Insufficient documentation

## 2023-10-02 NOTE — Therapy (Addendum)
 OUTPATIENT PHYSICAL THERAPY LOWER EXTREMITY EVALUATION   Patient Name: Cindy [name] MRN: [id number] DOB:1971/04/14, 53 y.o., female Today's Date: 10/02/2023  END OF SESSION:  PT End of Session - 10/02/23 1224     Visit Number 1    Number of Visits 8    PT Start Time 1100    PT Stop Time 1144    PT Time Calculation (min) 44 min    Activity Tolerance Patient tolerated treatment well;No increased pain    Behavior During Therapy WFL for tasks assessed/performed             Past Medical History:  Diagnosis Date   Asthma    Hypertension    Urticaria    History reviewed. No pertinent surgical history. Patient Active Problem List   Diagnosis Date Noted   Seasonal and perennial allergic rhinitis 06/30/2019   Reactive airway disease 06/30/2019    PCP: Olin Bertin, MD  REFERRING PROVIDER: Rance Burrows, MD  REFERRING DIAG: L knee pain  THERAPY DIAG:  Pes anserine bursitis  Rationale for Evaluation and Treatment: Rehabilitation  ONSET DATE: end of february  SUBJECTIVE:   SUBJECTIVE STATEMENT: Eval: Pt had acute onset of R knee pain in late February. Went to ED and was diagnosed with bursitis. Has difficulty walking and bending down. Feels pain the most when she is walking with 4/10 pain at worst. Knee has gradually been getting stiffer due to muscle tightness. Feels like her gait has been off since the onset of pain. Wants to get back to walking in her neighborhood and possibly work.   PERTINENT HISTORY: Asthma Hypertension Urticaria PAIN:  Are you having pain? Yes: NPRS scale: 0/10 right now, 4/10 worst Pain location: medial joint line, superior to patella Pain description: stabbing Aggravating factors: walking, squatting Relieving factors: stretching  PRECAUTIONS: None  RED FLAGS: None   WEIGHT BEARING RESTRICTIONS: No  FALLS:  Has patient fallen in last 6 months? No  LIVING ENVIRONMENT: 11-12 stairs  OCCUPATION: nurse  Hobbies:  Going on  walks  PLOF: Independent  PATIENT GOALS: decreased pain, increased confidence while walking  NEXT MD VISIT: n/a  OBJECTIVE:  Note: Objective measures were completed at Evaluation unless otherwise noted.  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 No evidence of acute fracture or dislocation. No focal bone lesion or bone destruction. No significant effusions. Soft tissues are unremarkable.  PATIENT SURVEYS:  LEFS 45/80  COGNITION: Overall cognitive status: Within functional limits for tasks assessed     SENSATION: WFL  EDEMA:    MUSCLE LENGTH:   POSTURE: No Significant postural limitations  PALPATION: TTP pes anserine and superior patella   LUMBAR ROM:   Active  A/PROM  eval  Flexion WFL  Extension WFL  Right lateral flexion WFL  Left lateral flexion WFL  Right rotation   Left rotation    (Blank rows = not tested)  LOWER EXTREMITY ROM:  Passive ROM Right eval Left eval  Hip flexion [facility]  Hip extension    Hip abduction [facility]  Hip adduction    Hip internal rotation WFL painful at end range [facility]  Hip external rotation [facility]  Knee flexion    Knee extension    Ankle dorsiflexion    Ankle plantarflexion    Ankle inversion    Ankle eversion     (Blank rows = not tested)  LOWER EXTREMITY MMT:  MMT Right eval Left eval  Hip flexion 22.9 27.1  Hip extension    Hip abduction 37.3 56.9  Hip adduction    Hip internal rotation    Hip external rotation    Knee flexion    Knee extension 34.5 57.8  Ankle dorsiflexion    Ankle plantarflexion    Ankle inversion    Ankle eversion     (Blank rows = not tested)  LOWER EXTREMITY SPECIAL TESTS:    FUNCTIONAL TESTS:     GAIT: Distance walked: 39ft Assistive device utilized: None Level of assistance: Complete Independence Comments: decreased stance time on  R, shifting to left because of pain and weakness   Treatment Date: 5/8 Manual:  Reviewed self soft tissue mobilization to pes area   There-ex:  Seated LAQ red rtb 2x10  Seated hip abd red rtb 2x10 (educated on just doing right side)  Standing calf stretch at wall 2x30sec  Standing bilateral calf stretch 2x30sec  Pt education on exercise grading and progressions    PATIENT EDUCATION:  Education details: HEP, symptom management, self care Person educated: Patient Education method: Explanation, Demonstration, Tactile cues, Verbal cues, and Handouts Education comprehension: verbalized understanding and returned demonstration  HOME EXERCISE PROGRAM: Access Code: [email or web address] URL: [email or web address] Date: 10/02/2023 Prepared by: Lonzell Robin  Exercises - Seated Long Arc Quad  - 1 x daily - 7 x weekly - 3 sets - 10 reps - Seated Hip Abduction with Resistance  - 1 x daily - 7 x weekly - 3 sets - 10 reps - Seated Hamstring Stretch  - 1 x daily - 7 x weekly - 3 sets - 10 reps - Standing Gastroc Stretch at Counter  - 1 x daily - 7 x weekly - 3 sets - 10 reps - Standing Gastroc Stretch on Foam 1/2 Roll  - 1 x daily - 7 x weekly - 3 sets - 10 reps  ASSESSMENT:  CLINICAL IMPRESSION: Eval: Patient is a 53 y.o. female who was seen today for physical therapy evaluation and treatment for pes anserine bursitis. Symptoms were brought out with palpation and knee flex. Gait was assessed with decreased stance time on R leg and shifting away due to pain and weakness. MMT was noted with biggest differences in knee ext and hip abd. Pt was instructed on nature of condition and treatment strategies moving forward. Goal is to get back to walkig in the neighborhood and have more confidence in community ambulation. Therapeutic exercises were done with education on grading, progressions, stretching times, and symptom management. Next visit can progress exercises with showing gym  alternatives.  [place] should also be considered. Pt tolerated all treatment strategies well today with no increase in symptoms. Pt will continue to benefit from skilled physical therapy to reduce irritability and increase functional strength for ADL's and recreational activities.   OBJECTIVE IMPAIRMENTS: Abnormal gait, decreased coordination, decreased endurance, decreased mobility, difficulty walking, decreased ROM, and decreased strength.   ACTIVITY LIMITATIONS: carrying, lifting, bending, squatting, and toileting  PARTICIPATION LIMITATIONS: cleaning, laundry, shopping, community activity, and occupation  PERSONAL FACTORS: 1-2 comorbidities: asthma,  are also affecting patient's functional outcome. Occupation   REHAB POTENTIAL: Good  CLINICAL DECISION MAKING: Evolving/moderate complexity  EVALUATION COMPLEXITY: Moderate   GOALS: Goals reviewed with patient? Yes  SHORT TERM GOALS: Target date: 10/23/2023   Pt will increase knee ext strength by 5lbs for community ambulation. Baseline: Goal status: INITIAL  2.  Pt will increase hip abd strength by 5lbs for community  ambulation.  Baseline:  Goal status: INITIAL  3.  Pt will have 8 point difference in LEFS score for functional improvement.  Baseline:  Goal status: INITIAL  4.  Pt will be independent with HEP.  Baseline:  Goal status: INITIAL    LONG TERM GOALS: Target date: 11/13/2023    Pt will increase knee ext strength by 15lbs for community ambulation. Baseline:  Goal status: INITIAL  2.  Pt will increase hip abd strength by 15lbs for community ambulation.  Baseline:  Goal status: INITIAL  3.  Pt will have 16 point difference in LEFS score for functional improvement. Baseline:  Goal status: INITIAL    PLAN:  PT FREQUENCY: 1x/week  PT DURATION: 6 weeks  PLANNED INTERVENTIONS: 40981- PT Re-evaluation, 97750- Physical Performance Testing, 97110-Therapeutic exercises, 97530- Therapeutic activity, 97112-  Neuromuscular re-education, [phone]- Self Care, 82956- Manual therapy, [phone]- Gait training, [phone]- Aquatic Therapy, [phone]- Electrical stimulation (unattended), [phone]- Electrical stimulation (manual), and 97033- Ionotophoresis 4mg /ml Dexamethasone  PLAN FOR NEXT SESSION: Consider ionto, take pt through gym exercises, manual therapy   Katheran Palms, Student-PT 10/02/2023, 12:25 PM  Signa Drier PT DTaP  I have reviewed and concur with this student's documentation.   Kitty Perkins, PT 10/03/2023 11:37 AM   During this treatment session, the therapist was present, participating in and directing the treatment.

## 2023-10-03 ENCOUNTER — Encounter (HOSPITAL_BASED_OUTPATIENT_CLINIC_OR_DEPARTMENT_OTHER): Payer: Self-pay | Admitting: Physical Therapy

## 2023-10-07 ENCOUNTER — Encounter (HOSPITAL_BASED_OUTPATIENT_CLINIC_OR_DEPARTMENT_OTHER): Payer: Self-pay

## 2023-10-07 ENCOUNTER — Ambulatory Visit (HOSPITAL_BASED_OUTPATIENT_CLINIC_OR_DEPARTMENT_OTHER)

## 2023-10-07 DIAGNOSIS — R2689 Other abnormalities of gait and mobility: Secondary | ICD-10-CM

## 2023-10-07 DIAGNOSIS — M6281 Muscle weakness (generalized): Secondary | ICD-10-CM

## 2023-10-07 DIAGNOSIS — M25561 Pain in right knee: Secondary | ICD-10-CM

## 2023-10-07 NOTE — Therapy (Signed)
 OUTPATIENT PHYSICAL THERAPY LOWER EXTREMITY EVALUATION   Patient Name: Cindy [name] MRN: [id number] DOB:[birth year], 53 y.o., female Today's Date: 10/07/2023  END OF SESSION:  PT End of Session - 10/07/23 1018     Visit Number 2    Number of Visits 8    Date for PT Re-Evaluation 11/28/23    PT Start Time 1017    PT Stop Time 1100    PT Time Calculation (min) 43 min    Activity Tolerance Patient tolerated treatment well    Behavior During Therapy WFL for tasks assessed/performed             Past Medical History:  Diagnosis Date   Asthma    Hypertension    Urticaria    History reviewed. No pertinent surgical history. Patient Active Problem List   Diagnosis Date Noted   Seasonal and perennial allergic rhinitis 06/30/2019   Reactive airway disease 06/30/2019    PCP: Olin Bertin, MD  REFERRING PROVIDER: Rance Burrows, MD  REFERRING DIAG: L knee pain  THERAPY DIAG:  Pes anserine bursitis  Rationale for Evaluation and Treatment: Rehabilitation  ONSET DATE: end of february  SUBJECTIVE:   SUBJECTIVE STATEMENT:  Pt reports no pain at rest, but does have 4/10 pain level with walking. Denies pain with HEP.    Eval: Pt had acute onset of R knee pain in late February. Went to ED and was diagnosed with bursitis. Has difficulty walking and bending down. Feels pain the most when she is walking with 4/10 pain at worst. Knee has gradually been getting stiffer due to muscle tightness. Feels like her gait has been off since the onset of pain. Wants to get back to walking in her neighborhood and possibly work.   PERTINENT HISTORY: Asthma Hypertension Urticaria PAIN:  Are you having pain? Yes: NPRS scale: 0/10 right now, 4/10 worst Pain location: medial joint line, superior to patella Pain description: stabbing Aggravating factors: walking, squatting Relieving factors: stretching  PRECAUTIONS: None  RED FLAGS: None   WEIGHT BEARING RESTRICTIONS: No  FALLS:   Has patient fallen in last 6 months? No  LIVING ENVIRONMENT: 11-12 stairs  OCCUPATION: nurse  Hobbies:  Going on walks  PLOF: Independent  PATIENT GOALS: decreased pain, increased confidence while walking  NEXT MD VISIT: n/a  OBJECTIVE:  Note: Objective measures were completed at Evaluation unless otherwise noted.  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 No evidence of acute fracture or dislocation. No focal bone lesion or bone destruction. No significant effusions. Soft tissues are unremarkable.  PATIENT SURVEYS:  LEFS 45/80  COGNITION: Overall cognitive status: Within functional limits for tasks assessed     SENSATION: WFL  EDEMA:    MUSCLE LENGTH:   POSTURE: No Significant postural limitations  PALPATION: TTP pes anserine and superior patella   LUMBAR ROM:   Active  A/PROM  eval  Flexion WFL  Extension WFL  Right lateral flexion WFL  Left lateral flexion WFL  Right rotation   Left rotation    (Blank rows = not tested)  LOWER EXTREMITY ROM:  Passive ROM Right eval Left eval  Hip flexion [facility]  Hip extension    Hip abduction [facility]  Hip adduction    Hip internal rotation WFL painful at end range [facility]  Hip external rotation [facility]  Knee flexion    Knee extension    Ankle dorsiflexion  Ankle plantarflexion    Ankle inversion    Ankle eversion     (Blank rows = not tested)  LOWER EXTREMITY MMT:  MMT Right eval Left eval  Hip flexion 22.9 27.1  Hip extension    Hip abduction 37.3 56.9  Hip adduction    Hip internal rotation    Hip external rotation    Knee flexion    Knee extension 34.5 57.8  Ankle dorsiflexion    Ankle plantarflexion    Ankle inversion    Ankle eversion     (Blank rows = not tested)  LOWER EXTREMITY SPECIAL TESTS:    FUNCTIONAL TESTS:     GAIT: Distance  walked: 20ft Assistive device utilized: None Level of assistance: Complete Independence Comments: decreased stance time on R, shifting to left because of pain and weakness   Treatment Date:  5/13 STM to medial quad/HS, adductors Supine stretch with strap- HS, adductors, ITB 2x30sec each RLE Piriformis stretch 30seconds x2 R Bridge 5" hold 2x10 Sidelying clam with GTB 2x15ea Standing adductor stretch 30sec x2ea Incline calf stretch 3x30seconds HEP update/review    5/8 Manual:  Reviewed self soft tissue mobilization to pes area   There-ex:  Seated LAQ red rtb 2x10  Seated hip abd red rtb 2x10 (educated on just doing right side)  Standing calf stretch at wall 2x30sec  Standing bilateral calf stretch 2x30sec  Pt education on exercise grading and progressions    PATIENT EDUCATION:  Education details: HEP, symptom management, self care Person educated: Patient Education method: Explanation, Demonstration, Tactile cues, Verbal cues, and Handouts Education comprehension: verbalized understanding and returned demonstration  HOME EXERCISE PROGRAM: Access Code: [email or web address] URL: [email or web address] Date: 10/02/2023 Prepared by: Lonzell Robin  Exercises - Seated Long Arc Quad  - 1 x daily - 7 x weekly - 3 sets - 10 reps - Seated Hip Abduction with Resistance  - 1 x daily - 7 x weekly - 3 sets - 10 reps - Seated Hamstring Stretch  - 1 x daily - 7 x weekly - 3 sets - 10 reps - Standing Gastroc Stretch at Counter  - 1 x daily - 7 x weekly - 3 sets - 10 reps - Standing Gastroc Stretch on Foam 1/2 Roll  - 1 x daily - 7 x weekly - 3 sets - 10 reps  ASSESSMENT:  CLINICAL IMPRESSION:  Tender/tight into medial quad, HS, and adductors so spent time on STM to address this. Instructed pt in adductor stretching and added to HEP. Good tolerance for hip strengthening exericses today, though some muscular fatigue present. Educated pt about DOMS and management  strategies. Reviewed updated HEP with pt with instructions to avoid pushing past pain limitations. Pt will be travelling to [place] for a week for vacation.    Eval: Patient is a 53 y.o. female who was seen today for physical therapy evaluation and treatment for pes anserine bursitis. Symptoms were brought out with palpation and knee flex. Gait was assessed with decreased stance time on R leg and shifting away due to pain and weakness. MMT was noted with biggest differences in knee ext and hip abd. Pt was instructed on nature of condition and treatment strategies moving forward. Goal is to get back to walkig in the neighborhood and have more confidence in community ambulation. Therapeutic exercises were done with education on grading, progressions, stretching times, and symptom management. Next visit can progress exercises with showing gym alternatives.  Ionoto should also be considered. Pt tolerated all treatment  strategies well today with no increase in symptoms. Pt will continue to benefit from skilled physical therapy to reduce irritability and increase functional strength for ADL's and recreational activities.   OBJECTIVE IMPAIRMENTS: Abnormal gait, decreased coordination, decreased endurance, decreased mobility, difficulty walking, decreased ROM, and decreased strength.   ACTIVITY LIMITATIONS: carrying, lifting, bending, squatting, and toileting  PARTICIPATION LIMITATIONS: cleaning, laundry, shopping, community activity, and occupation  PERSONAL FACTORS: 1-2 comorbidities: asthma,  are also affecting patient's functional outcome. Occupation   REHAB POTENTIAL: Good  CLINICAL DECISION MAKING: Evolving/moderate complexity  EVALUATION COMPLEXITY: Moderate   GOALS: Goals reviewed with patient? Yes  SHORT TERM GOALS: Target date: 10/23/2023   Pt will increase knee ext strength by 5lbs for community ambulation. Baseline: Goal status: INITIAL  2.  Pt will increase hip abd strength by 5lbs  for community ambulation.  Baseline:  Goal status: INITIAL  3.  Pt will have 8 point difference in LEFS score for functional improvement.  Baseline:  Goal status: INITIAL  4.  Pt will be independent with HEP.  Baseline:  Goal status: INITIAL    LONG TERM GOALS: Target date: 11/13/2023    Pt will increase knee ext strength by 15lbs for community ambulation. Baseline:  Goal status: INITIAL  2.  Pt will increase hip abd strength by 15lbs for community ambulation.  Baseline:  Goal status: INITIAL  3.  Pt will have 16 point difference in LEFS score for functional improvement. Baseline:  Goal status: INITIAL    PLAN:  PT FREQUENCY: 1x/week  PT DURATION: 6 weeks  PLANNED INTERVENTIONS: 78295- PT Re-evaluation, 97750- Physical Performance Testing, 97110-Therapeutic exercises, 97530- Therapeutic activity, [id number]- Neuromuscular re-education, 97535- Self Care, 62130- Manual therapy, [phone]- Gait training, [phone]- Aquatic Therapy, [phone]- Electrical stimulation (unattended), [phone]- Electrical stimulation (manual), and 97033- Ionotophoresis 4mg /ml Dexamethasone  PLAN FOR NEXT SESSION: Consider ionto, take pt through gym exercises, manual therapy   Fronie Jewett Katherleen Folkes, PTA 10/07/2023, 11:07 AM

## 2023-10-23 ENCOUNTER — Encounter (HOSPITAL_BASED_OUTPATIENT_CLINIC_OR_DEPARTMENT_OTHER): Payer: Self-pay | Admitting: Physical Therapy

## 2023-10-23 ENCOUNTER — Ambulatory Visit (HOSPITAL_BASED_OUTPATIENT_CLINIC_OR_DEPARTMENT_OTHER): Admitting: Physical Therapy

## 2023-10-23 DIAGNOSIS — M25561 Pain in right knee: Secondary | ICD-10-CM

## 2023-10-23 DIAGNOSIS — R2689 Other abnormalities of gait and mobility: Secondary | ICD-10-CM

## 2023-10-23 DIAGNOSIS — M6281 Muscle weakness (generalized): Secondary | ICD-10-CM

## 2023-10-23 NOTE — Therapy (Addendum)
 OUTPATIENT PHYSICAL THERAPY LOWER EXTREMITY TREATMENT   Patient Name: Cindy [name] MRN: [id number] DOB:[birth year], 53 y.o., female Today's Date: 10/23/2023  END OF SESSION:  PT End of Session - 10/23/23 1403     Visit Number 3    Number of Visits 8    Date for PT Re-Evaluation 11/28/23    PT Start Time 1402    PT Stop Time 1442    PT Time Calculation (min) 40 min    Activity Tolerance Patient tolerated treatment well    Behavior During Therapy [facility] for tasks assessed/performed             Past Medical History:  Diagnosis Date   Asthma    Hypertension    Urticaria    History reviewed. No pertinent surgical history. Patient Active Problem List   Diagnosis Date Noted   Seasonal and perennial allergic rhinitis 06/30/2019   Reactive airway disease 06/30/2019    PCP: Olin Bertin, MD  REFERRING PROVIDER: Rance Burrows, MD  REFERRING DIAG: L knee pain  THERAPY DIAG:  Pes anserine bursitis  Rationale for Evaluation and Treatment: Rehabilitation  ONSET DATE: end of february  SUBJECTIVE:   SUBJECTIVE STATEMENT:  Pt reports that yesterday was the first time she hadn't had stabbing pain in R knee. She just returned from vacation out of country. She reports she did a lot of walking, and completed HEP stretches.    Eval: Pt had acute onset of R knee pain in late February. Went to ED and was diagnosed with bursitis. Has difficulty walking and bending down. Feels pain the most when she is walking with 4/10 pain at worst. Knee has gradually been getting stiffer due to muscle tightness. Feels like her gait has been off since the onset of pain. Wants to get back to walking in her neighborhood and possibly work.   PERTINENT HISTORY: Asthma Hypertension Urticaria PAIN:  Are you having pain? no: NPRS scale: 0/10 Pain location: medial joint line, superior to patella Pain description:  Aggravating factors: walking, squatting Relieving factors:  stretching  PRECAUTIONS: None  RED FLAGS: None   WEIGHT BEARING RESTRICTIONS: No  FALLS:  Has patient fallen in last 6 months? No  LIVING ENVIRONMENT: 11-12 stairs  OCCUPATION: nurse  Hobbies:  Going on walks  PLOF: Independent  PATIENT GOALS: decreased pain, increased confidence while walking  NEXT MD VISIT: n/a  OBJECTIVE:  Note: Objective measures were completed at Evaluation unless otherwise noted.  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 No evidence of acute fracture or dislocation. No focal bone lesion or bone destruction. No significant effusions. Soft tissues are unremarkable.  PATIENT SURVEYS:  LEFS 45/80  COGNITION: Overall cognitive status: Within functional limits for tasks assessed     SENSATION: WFL  EDEMA:    MUSCLE LENGTH:   POSTURE: No Significant postural limitations  PALPATION: TTP pes anserine and superior patella   LUMBAR ROM:   Active  A/PROM  eval  Flexion WFL  Extension WFL  Right lateral flexion WFL  Left lateral flexion WFL  Right rotation   Left rotation    (Blank rows = not tested)  LOWER EXTREMITY ROM:  Passive ROM Right eval Left eval  Hip flexion [facility]  Hip extension    Hip abduction [facility]  Hip adduction    Hip internal rotation WFL painful at end range [facility]  Hip external rotation [facility]  Knee flexion    Knee extension    Ankle dorsiflexion    Ankle plantarflexion    Ankle inversion    Ankle eversion     (Blank rows = not tested)  LOWER EXTREMITY MMT:  MMT Right eval Left eval Right  10/23/23  Hip flexion 22.9 27.1 34.1  Hip extension     Hip abduction 37.3 56.9 39.7  Hip adduction     Hip internal rotation     Hip external rotation     Knee flexion     Knee extension 34.5 57.8 46.9  Ankle dorsiflexion     Ankle plantarflexion     Ankle inversion      Ankle eversion      (Blank rows = not tested)  LOWER EXTREMITY SPECIAL TESTS:    FUNCTIONAL TESTS:     GAIT: Distance walked: 16ft Assistive device utilized: None Level of assistance: Complete Independence Comments: decreased stance time on R, shifting to left because of pain and weakness   Treatment Date: 10/23/23 Upright bike L3 x 5 min Supine stretch with strap- HS, adductors, ITB x 1 min each position (cues for overpressure above knee for improved knee ext) Runners stretch for gastroc x 20s each LE MMT Self care: Pt instructed in self massage with rolling R thigh (hamstring/ adductor)  Sit to Stand in staggered stance x 10 Adductor stretch at counter x 15s x 3  Gait: cues for increased R heel strike, even stance time, and even step length - improved with cues and repetition Seated piriformis stretch -> modified pigeon pose   5/13 STM to medial quad/HS, adductors Supine stretch with strap- HS, adductors, ITB 2x30sec each RLE Piriformis stretch 30seconds x2 R Bridge 5" hold 2x10 Sidelying clam with GTB 2x15ea Standing adductor stretch 30sec x2ea Incline calf stretch 3x30seconds HEP update/review    5/8 Manual:  Reviewed self soft tissue mobilization to pes area   There-ex:  Seated LAQ red rtb 2x10  Seated hip abd red rtb 2x10 (educated on just doing right side)  Standing calf stretch at wall 2x30sec  Standing bilateral calf stretch 2x30sec  Pt education on exercise grading and progressions    PATIENT EDUCATION:  Education details: HEP, symptom management, self care Person educated: Patient Education method: Explanation, Demonstration, Tactile cues, Verbal cues, and Handouts Education comprehension: verbalized understanding and returned demonstration  HOME EXERCISE PROGRAM: Access Code: [email or web address] URL: [email or web address] Date: 10/02/2023 Prepared by: Lonzell Robin  Exercises - Seated Long Arc Quad  - 1 x daily - 7 x  weekly - 3 sets - 10 reps - Seated Hip Abduction with Resistance  - 1 x daily - 7 x weekly - 3 sets - 10 reps - Seated Hamstring Stretch  - 1 x daily - 7 x weekly - 3 sets - 10 reps - Standing Gastroc Stretch at Counter  - 1 x daily - 7 x weekly - 3 sets - 10 reps - Standing Gastroc Stretch on Foam 1/2 Roll  - 1 x daily - 7 x weekly - 3 sets - 10 reps  ASSESSMENT:  CLINICAL IMPRESSION: Positive response to stretches issued.  Pt demonstrated improved strength with MMT; has met STG1.  Improved gait with cues for increased R heel strike.  No increase in pain with exercises performed today.    Eval: Patient is a 53 y.o. female who was seen today for physical therapy evaluation and treatment for pes anserine bursitis. Symptoms were brought out with palpation and  knee flex. Gait was assessed with decreased stance time on R leg and shifting away due to pain and weakness. MMT was noted with biggest differences in knee ext and hip abd. Pt was instructed on nature of condition and treatment strategies moving forward. Goal is to get back to walkig in the neighborhood and have more confidence in community ambulation. Therapeutic exercises were done with education on grading, progressions, stretching times, and symptom management. Next visit can progress exercises with showing gym alternatives.  Ionoto should also be considered. Pt tolerated all treatment strategies well today with no increase in symptoms. Pt will continue to benefit from skilled physical therapy to reduce irritability and increase functional strength for ADL's and recreational activities.   OBJECTIVE IMPAIRMENTS: Abnormal gait, decreased coordination, decreased endurance, decreased mobility, difficulty walking, decreased ROM, and decreased strength.   ACTIVITY LIMITATIONS: carrying, lifting, bending, squatting, and toileting  PARTICIPATION LIMITATIONS: cleaning, laundry, shopping, community activity, and occupation  PERSONAL FACTORS: 1-2  comorbidities: asthma,  are also affecting patient's functional outcome. Occupation   REHAB POTENTIAL: Good  CLINICAL DECISION MAKING: Evolving/moderate complexity  EVALUATION COMPLEXITY: Moderate   GOALS: Goals reviewed with patient? Yes  SHORT TERM GOALS: Target date: 10/23/2023   Pt will increase knee ext strength by 5lbs for community ambulation. Baseline: Goal status: MET - 10/23/23  2.  Pt will increase hip abd strength by 5lbs for community ambulation.  Baseline:  Goal status: IN  PROGRESS -10/23/23  3.  Pt will have 8 point difference in LEFS score for functional improvement.  Baseline:  Goal status: INITIAL  4.  Pt will be independent with HEP.  Baseline:  Goal status: INITIAL    LONG TERM GOALS: Target date: 11/13/2023    Pt will increase knee ext strength by 15lbs for community ambulation. Baseline:  Goal status: INITIAL  2.  Pt will increase hip abd strength by 15lbs for community ambulation.  Baseline:  Goal status: INITIAL  3.  Pt will have 16 point difference in LEFS score for functional improvement. Baseline:  Goal status: INITIAL    PLAN:  PT FREQUENCY: 1x/week  PT DURATION: 6 weeks  PLANNED INTERVENTIONS: 97164- PT Re-evaluation, 97750- Physical Performance Testing, 97110-Therapeutic exercises, 97530- Therapeutic activity, [id number]- Neuromuscular re-education, 97535- Self Care, 16109- Manual therapy, [phone]- Gait training, [phone]- Aquatic Therapy, [phone]- Electrical stimulation (unattended), [phone]- Electrical stimulation (manual), and 97033- Ionotophoresis 4mg /ml Dexamethasone  PLAN FOR NEXT SESSION: Consider ionto, take pt through gym exercises, manual therapy  Almedia Jacobsen, PTA 10/23/23 5:37 PM [facility] [address] [place], [place], [address] Phone: [phone]   Fax:  [phone]

## 2023-10-29 ENCOUNTER — Ambulatory Visit (INDEPENDENT_AMBULATORY_CARE_PROVIDER_SITE_OTHER)

## 2023-10-29 DIAGNOSIS — J309 Allergic rhinitis, unspecified: Secondary | ICD-10-CM | POA: Diagnosis not present

## 2023-10-31 ENCOUNTER — Ambulatory Visit (HOSPITAL_BASED_OUTPATIENT_CLINIC_OR_DEPARTMENT_OTHER): Attending: Sports Medicine

## 2023-10-31 ENCOUNTER — Encounter (HOSPITAL_BASED_OUTPATIENT_CLINIC_OR_DEPARTMENT_OTHER): Payer: Self-pay

## 2023-10-31 DIAGNOSIS — R2689 Other abnormalities of gait and mobility: Secondary | ICD-10-CM | POA: Diagnosis present

## 2023-10-31 DIAGNOSIS — M25561 Pain in right knee: Secondary | ICD-10-CM | POA: Insufficient documentation

## 2023-10-31 DIAGNOSIS — M6281 Muscle weakness (generalized): Secondary | ICD-10-CM | POA: Diagnosis present

## 2023-10-31 NOTE — Therapy (Signed)
 OUTPATIENT PHYSICAL THERAPY LOWER EXTREMITY TREATMENT   Patient Name: Cindy [name] MRN: [id number] DOB:07/19/1970, 53 y.o., female Today's Date: 10/31/2023  END OF SESSION:  PT End of Session - 10/31/23 1021     Visit Number 4    Number of Visits 8    Date for PT Re-Evaluation 11/28/23    PT Start Time 1019    PT Stop Time 1100    PT Time Calculation (min) 41 min    Activity Tolerance Patient tolerated treatment well    Behavior During Therapy WFL for tasks assessed/performed              Past Medical History:  Diagnosis Date   Asthma    Hypertension    Urticaria    History reviewed. No pertinent surgical history. Patient Active Problem List   Diagnosis Date Noted   Seasonal and perennial allergic rhinitis 06/30/2019   Reactive airway disease 06/30/2019    PCP: Olin Bertin, MD  REFERRING PROVIDER: Rance Burrows, MD  REFERRING DIAG: L knee pain  THERAPY DIAG:  Pes anserine bursitis  Rationale for Evaluation and Treatment: Rehabilitation  ONSET DATE: end of february  SUBJECTIVE:   SUBJECTIVE STATEMENT:  Pt reports stiffness in knee, especially in the morning. Also stiff after driving >40 minutes. Reports decreased pain overall.    Eval: Pt had acute onset of R knee pain in late February. Went to ED and was diagnosed with bursitis. Has difficulty walking and bending down. Feels pain the most when she is walking with 4/10 pain at worst. Knee has gradually been getting stiffer due to muscle tightness. Feels like her gait has been off since the onset of pain. Wants to get back to walking in her neighborhood and possibly work.   PERTINENT HISTORY: Asthma Hypertension Urticaria PAIN:  Are you having pain? no: NPRS scale: 0/10 Pain location: medial joint line, superior to patella Pain description:  Aggravating factors: walking, squatting Relieving factors: stretching  PRECAUTIONS: None  RED FLAGS: None   WEIGHT BEARING RESTRICTIONS: No  FALLS:   Has patient fallen in last 6 months? No  LIVING ENVIRONMENT: 11-12 stairs  OCCUPATION: nurse  Hobbies:  Going on walks  PLOF: Independent  PATIENT GOALS: decreased pain, increased confidence while walking  NEXT MD VISIT: n/a  OBJECTIVE:  Note: Objective measures were completed at Evaluation unless otherwise noted.  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 No evidence of acute fracture or dislocation. No focal bone lesion or bone destruction. No significant effusions. Soft tissues are unremarkable.  PATIENT SURVEYS:  LEFS 45/80  COGNITION: Overall cognitive status: Within functional limits for tasks assessed     SENSATION: WFL  EDEMA:    MUSCLE LENGTH:   POSTURE: No Significant postural limitations  PALPATION: TTP pes anserine and superior patella   LUMBAR ROM:   Active  A/PROM  eval  Flexion WFL  Extension WFL  Right lateral flexion WFL  Left lateral flexion WFL  Right rotation   Left rotation    (Blank rows = not tested)  LOWER EXTREMITY ROM:  Passive ROM Right eval Left eval  Hip flexion [facility]  Hip extension    Hip abduction [facility]  Hip adduction    Hip internal rotation WFL painful at end range [facility]  Hip external rotation [facility]  Knee flexion    Knee extension    Ankle dorsiflexion  Ankle plantarflexion    Ankle inversion    Ankle eversion     (Blank rows = not tested)  LOWER EXTREMITY MMT:  MMT Right eval Left eval Right  10/23/23  Hip flexion 22.9 27.1 34.1  Hip extension     Hip abduction 37.3 56.9 39.7  Hip adduction     Hip internal rotation     Hip external rotation     Knee flexion     Knee extension 34.5 57.8 46.9  Ankle dorsiflexion     Ankle plantarflexion     Ankle inversion     Ankle eversion      (Blank rows = not tested)  LOWER EXTREMITY SPECIAL TESTS:     FUNCTIONAL TESTS:     GAIT: Distance walked: 100ft Assistive device utilized: None Level of assistance: Complete Independence Comments: decreased stance time on R, shifting to left because of pain and weakness   Treatment Date:  6/6 Upright bike L3 x 5 min STM to medial quad/HS, adductors Supine stretch with strap- HS, adductors, ITB 2x30sec each RLE Single leg bridge 3" hold 2x10 SLR with hip ER 2 x 10 SLR on elbows 2 x 10 Prone HSC 3# 2x10 Prone hip extension with knee flexed 2x10 Sidelying hip abduction 2x10ea LAQ 4# 5" 3x10 Incline calf stretch 3x30seconds   10/23/23 Upright bike L3 x 5 min Supine stretch with strap- HS, adductors, ITB x 1 min each position (cues for overpressure above knee for improved knee ext) Runners stretch for gastroc x 20s each LE MMT Self care: Pt instructed in self massage with rolling R thigh (hamstring/ adductor)  Sit to Stand in staggered stance x 10 Adductor stretch at counter x 15s x 3  Gait: cues for increased R heel strike, even stance time, and even step length - improved with cues and repetition Seated piriformis stretch -> modified pigeon pose   5/13 STM to medial quad/HS, adductors Supine stretch with strap- HS, adductors, ITB 2x30sec each RLE Piriformis stretch 30seconds x2 R Bridge 5" hold 2x10 Sidelying clam with GTB 2x15ea Standing adductor stretch 30sec x2ea Incline calf stretch 3x30seconds HEP update/review    5/8 Manual:  Reviewed self soft tissue mobilization to pes area   There-ex:  Seated LAQ red rtb 2x10  Seated hip abd red rtb 2x10 (educated on just doing right side)  Standing calf stretch at wall 2x30sec  Standing bilateral calf stretch 2x30sec  Pt education on exercise grading and progressions    PATIENT EDUCATION:  Education details: HEP, symptom management, self care Person educated: Patient Education method: Explanation, Demonstration, Tactile cues, Verbal cues, and  Handouts Education comprehension: verbalized understanding and returned demonstration  HOME EXERCISE PROGRAM: Access Code: [email or web address] URL: [email or web address] Date: 10/02/2023 Prepared by: Lonzell Robin  Exercises - Seated Long Arc Quad  - 1 x daily - 7 x weekly - 3 sets - 10 reps - Seated Hip Abduction with Resistance  - 1 x daily - 7 x weekly - 3 sets - 10 reps - Seated Hamstring Stretch  - 1 x daily - 7 x weekly - 3 sets - 10 reps - Standing Gastroc Stretch at Counter  - 1 x daily - 7 x weekly - 3 sets - 10 reps - Standing Gastroc Stretch on Foam 1/2 Roll  - 1 x daily - 7 x weekly - 3 sets - 10 reps  ASSESSMENT:  CLINICAL IMPRESSION: Good tolerance for progressions to strength today.  Patient denied pain throughout session.  Able  to advance to single-leg bridge with good performance.  Added SLR with hip external rotation to target VMO.  Continued with STM to medial thigh to decrease tightness.  Will advance to close chain activity next session was tolerated well.   Eval: Patient is a 53 y.o. female who was seen today for physical therapy evaluation and treatment for pes anserine bursitis. Symptoms were brought out with palpation and knee flex. Gait was assessed with decreased stance time on R leg and shifting away due to pain and weakness. MMT was noted with biggest differences in knee ext and hip abd. Pt was instructed on nature of condition and treatment strategies moving forward. Goal is to get back to walkig in the neighborhood and have more confidence in community ambulation. Therapeutic exercises were done with education on grading, progressions, stretching times, and symptom management. Next visit can progress exercises with showing gym alternatives.  Ionoto should also be considered. Pt tolerated all treatment strategies well today with no increase in symptoms. Pt will continue to benefit from skilled physical therapy to reduce irritability and increase functional  strength for ADL's and recreational activities.   OBJECTIVE IMPAIRMENTS: Abnormal gait, decreased coordination, decreased endurance, decreased mobility, difficulty walking, decreased ROM, and decreased strength.   ACTIVITY LIMITATIONS: carrying, lifting, bending, squatting, and toileting  PARTICIPATION LIMITATIONS: cleaning, laundry, shopping, community activity, and occupation  PERSONAL FACTORS: 1-2 comorbidities: asthma,  are also affecting patient's functional outcome. Occupation   REHAB POTENTIAL: Good  CLINICAL DECISION MAKING: Evolving/moderate complexity  EVALUATION COMPLEXITY: Moderate   GOALS: Goals reviewed with patient? Yes  SHORT TERM GOALS: Target date: 10/23/2023   Pt will increase knee ext strength by 5lbs for community ambulation. Baseline: Goal status: MET - 10/23/23  2.  Pt will increase hip abd strength by 5lbs for community ambulation.  Baseline:  Goal status: IN  PROGRESS -10/23/23  3.  Pt will have 8 point difference in LEFS score for functional improvement.  Baseline:  Goal status: INITIAL  4.  Pt will be independent with HEP.  Baseline:  Goal status: INITIAL    LONG TERM GOALS: Target date: 11/13/2023    Pt will increase knee ext strength by 15lbs for community ambulation. Baseline:  Goal status: INITIAL  2.  Pt will increase hip abd strength by 15lbs for community ambulation.  Baseline:  Goal status: INITIAL  3.  Pt will have 16 point difference in LEFS score for functional improvement. Baseline:  Goal status: INITIAL    PLAN:  PT FREQUENCY: 1x/week  PT DURATION: 6 weeks  PLANNED INTERVENTIONS: 41324- PT Re-evaluation, 97750- Physical Performance Testing, 97110-Therapeutic exercises, 97530- Therapeutic activity, [id number]- Neuromuscular re-education, [phone]- Self Care, 72536- Manual therapy, [phone]- Gait training, [phone]- Aquatic Therapy, [phone]- Electrical stimulation (unattended), [phone]- Electrical stimulation (manual), and 97033-  Ionotophoresis 4mg /ml Dexamethasone  PLAN FOR NEXT SESSION: Consider ionto, take pt through gym exercises, manual therapy  Herb Loges, PTA  10/31/23 5:19 PM [facility] [address] [place], [place], [address] Phone: [phone]   Fax:  [phone]

## 2023-11-06 ENCOUNTER — Ambulatory Visit (INDEPENDENT_AMBULATORY_CARE_PROVIDER_SITE_OTHER): Payer: Self-pay

## 2023-11-06 DIAGNOSIS — J309 Allergic rhinitis, unspecified: Secondary | ICD-10-CM

## 2023-11-07 ENCOUNTER — Ambulatory Visit (HOSPITAL_BASED_OUTPATIENT_CLINIC_OR_DEPARTMENT_OTHER)

## 2023-11-07 ENCOUNTER — Encounter (HOSPITAL_BASED_OUTPATIENT_CLINIC_OR_DEPARTMENT_OTHER): Payer: Self-pay

## 2023-11-07 DIAGNOSIS — M25561 Pain in right knee: Secondary | ICD-10-CM | POA: Diagnosis not present

## 2023-11-07 DIAGNOSIS — R2689 Other abnormalities of gait and mobility: Secondary | ICD-10-CM

## 2023-11-07 DIAGNOSIS — M6281 Muscle weakness (generalized): Secondary | ICD-10-CM

## 2023-11-07 NOTE — Therapy (Signed)
 OUTPATIENT PHYSICAL THERAPY LOWER EXTREMITY TREATMENT   Patient Name: Cindy [name] MRN: [id number] DOB:July 01, 1970, 53 y.o., female Today's Date: 11/07/2023  END OF SESSION:  PT End of Session - 11/07/23 1017     Visit Number 5    Number of Visits 8    Date for PT Re-Evaluation 11/28/23    PT Start Time 1018    PT Stop Time 1100    PT Time Calculation (min) 42 min    Activity Tolerance Patient tolerated treatment well    Behavior During Therapy WFL for tasks assessed/performed            Past Medical History:  Diagnosis Date   Asthma    Hypertension    Urticaria    History reviewed. No pertinent surgical history. Patient Active Problem List   Diagnosis Date Noted   Seasonal and perennial allergic rhinitis 06/30/2019   Reactive airway disease 06/30/2019    PCP: Olin Bertin, MD  REFERRING PROVIDER: Rance Burrows, MD  REFERRING DIAG: L knee pain  THERAPY DIAG:  Pes anserine bursitis  Rationale for Evaluation and Treatment: Rehabilitation  ONSET DATE: end of february  SUBJECTIVE:   SUBJECTIVE STATEMENT:  Pt reports no pain at entry only mild tightness. It's hard finding a balance. If I do too much it hurts a little, if I don't do enough it tightens up.   Eval: Pt had acute onset of R knee pain in late February. Went to ED and was diagnosed with bursitis. Has difficulty walking and bending down. Feels pain the most when she is walking with 4/10 pain at worst. Knee has gradually been getting stiffer due to muscle tightness. Feels like her gait has been off since the onset of pain. Wants to get back to walking in her neighborhood and possibly work.   PERTINENT HISTORY: Asthma Hypertension Urticaria PAIN:  Are you having pain? no: NPRS scale: 0/10 Pain location: medial joint line, superior to patella Pain description:  Aggravating factors: walking, squatting Relieving factors: stretching  PRECAUTIONS: None  RED FLAGS: None   WEIGHT BEARING  RESTRICTIONS: No  FALLS:  Has patient fallen in last 6 months? No  LIVING ENVIRONMENT: 11-12 stairs  OCCUPATION: nurse  Hobbies:  Going on walks  PLOF: Independent  PATIENT GOALS: decreased pain, increased confidence while walking  NEXT MD VISIT: n/a  OBJECTIVE:  Note: Objective measures were completed at Evaluation unless otherwise noted.  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 No evidence of acute fracture or dislocation. No focal bone lesion or bone destruction. No significant effusions. Soft tissues are unremarkable.  PATIENT SURVEYS:  LEFS 45/80  COGNITION: Overall cognitive status: Within functional limits for tasks assessed     SENSATION: WFL  EDEMA:    MUSCLE LENGTH:   POSTURE: No Significant postural limitations  PALPATION: TTP pes anserine and superior patella   LUMBAR ROM:   Active  A/PROM  eval  Flexion WFL  Extension WFL  Right lateral flexion WFL  Left lateral flexion WFL  Right rotation   Left rotation    (Blank rows = not tested)  LOWER EXTREMITY ROM:  Passive ROM Right eval Left eval  Hip flexion [facility]  Hip extension    Hip abduction [facility]  Hip adduction    Hip internal rotation WFL painful at end range [facility]  Hip external rotation [facility]  Knee flexion  Knee extension    Ankle dorsiflexion    Ankle plantarflexion    Ankle inversion    Ankle eversion     (Blank rows = not tested)  LOWER EXTREMITY MMT:  MMT Right eval Left eval Right  10/23/23  Hip flexion 22.9 27.1 34.1  Hip extension     Hip abduction 37.3 56.9 39.7  Hip adduction     Hip internal rotation     Hip external rotation     Knee flexion     Knee extension 34.5 57.8 46.9  Ankle dorsiflexion     Ankle plantarflexion     Ankle inversion     Ankle eversion      (Blank rows = not tested)  LOWER EXTREMITY  SPECIAL TESTS:    FUNCTIONAL TESTS:     GAIT: Distance walked: 71ft Assistive device utilized: None Level of assistance: Complete Independence Comments: decreased stance time on R, shifting to left because of pain and weakness   Treatment Date:  6/13 Upright bike L3 x 5 min Supine stretch with strap- HS, adductors, ITB 2x30sec each RLE Single leg bridge 3 hold 3x10ea SLR with hip ER 1# 2 x 10 SLR with LE neutral 1# 2x10 Prone HSC 4# 3x10 Prone hip extension 4# 2x10ea Sidelying hip abduction 1# 3x10R LAQ 5# 5 3x10ea Sit to stands 2x10 Posterior slide lunges 2x10ea Hip hinge at wall with 10lb KB x10  6/6 Upright bike L3 x 5 min STM to medial quad/HS, adductors Supine stretch with strap- HS, adductors, ITB 2x30sec each RLE Single leg bridge 3 hold 2x10 SLR with hip ER 2 x 10 SLR on elbows 2 x 10 Prone HSC 3# 2x10 Prone hip extension with knee flexed 2x10 Sidelying hip abduction 2x10ea LAQ 4# 5 3x10 Incline calf stretch 3x30seconds   10/23/23 Upright bike L3 x 5 min Supine stretch with strap- HS, adductors, ITB x 1 min each position (cues for overpressure above knee for improved knee ext) Runners stretch for gastroc x 20s each LE MMT Self care: Pt instructed in self massage with rolling R thigh (hamstring/ adductor)  Sit to Stand in staggered stance x 10 Adductor stretch at counter x 15s x 3  Gait: cues for increased R heel strike, even stance time, and even step length - improved with cues and repetition Seated piriformis stretch -> modified pigeon pose   5/13 STM to medial quad/HS, adductors Supine stretch with strap- HS, adductors, ITB 2x30sec each RLE Piriformis stretch 30seconds x2 R Bridge 5 hold 2x10 Sidelying clam with GTB 2x15ea Standing adductor stretch 30sec x2ea Incline calf stretch 3x30seconds HEP update/review    5/8 Manual:  Reviewed self soft tissue mobilization to pes area   There-ex:  Seated LAQ red rtb 2x10  Seated  hip abd red rtb 2x10 (educated on just doing right side)  Standing calf stretch at wall 2x30sec  Standing bilateral calf stretch 2x30sec  Pt education on exercise grading and progressions    PATIENT EDUCATION:  Education details: HEP, symptom management, self care Person educated: Patient Education method: Explanation, Demonstration, Tactile cues, Verbal cues, and Handouts Education comprehension: verbalized understanding and returned demonstration  HOME EXERCISE PROGRAM: Access Code: [email or web address] URL: [email or web address] Date: 10/02/2023 Prepared by: Lonzell Robin  Exercises - Seated Long Arc Quad  - 1 x daily - 7 x weekly - 3 sets - 10 reps - Seated Hip Abduction with Resistance  - 1 x daily - 7 x weekly - 3 sets - 10 reps -  Seated Hamstring Stretch  - 1 x daily - 7 x weekly - 3 sets - 10 reps - Standing Gastroc Stretch at Counter  - 1 x daily - 7 x weekly - 3 sets - 10 reps - Standing Gastroc Stretch on Foam 1/2 Roll  - 1 x daily - 7 x weekly - 3 sets - 10 reps  ASSESSMENT:  CLINICAL IMPRESSION: Continued to work on LE strengthening progressions. Able to increase to 1# with supine SLR in both neutral and ER positions. Also performed sidelying hip abduction with 1#. She did report muscular fatigue with progressions, but denied any knee pain.   Eval: Patient is a 53 y.o. female who was seen today for physical therapy evaluation and treatment for pes anserine bursitis. Symptoms were brought out with palpation and knee flex. Gait was assessed with decreased stance time on R leg and shifting away due to pain and weakness. MMT was noted with biggest differences in knee ext and hip abd. Pt was instructed on nature of condition and treatment strategies moving forward. Goal is to get back to walkig in the neighborhood and have more confidence in community ambulation. Therapeutic exercises were done with education on grading, progressions, stretching times, and symptom management.  Next visit can progress exercises with showing gym alternatives.  Ionoto should also be considered. Pt tolerated all treatment strategies well today with no increase in symptoms. Pt will continue to benefit from skilled physical therapy to reduce irritability and increase functional strength for ADL's and recreational activities.   OBJECTIVE IMPAIRMENTS: Abnormal gait, decreased coordination, decreased endurance, decreased mobility, difficulty walking, decreased ROM, and decreased strength.   ACTIVITY LIMITATIONS: carrying, lifting, bending, squatting, and toileting  PARTICIPATION LIMITATIONS: cleaning, laundry, shopping, community activity, and occupation  PERSONAL FACTORS: 1-2 comorbidities: asthma,  are also affecting patient's functional outcome. Occupation   REHAB POTENTIAL: Good  CLINICAL DECISION MAKING: Evolving/moderate complexity  EVALUATION COMPLEXITY: Moderate   GOALS: Goals reviewed with patient? Yes  SHORT TERM GOALS: Target date: 10/23/2023   Pt will increase knee ext strength by 5lbs for community ambulation. Baseline: Goal status: MET - 10/23/23  2.  Pt will increase hip abd strength by 5lbs for community ambulation.  Baseline:  Goal status: IN  PROGRESS -10/23/23  3.  Pt will have 8 point difference in LEFS score for functional improvement.  Baseline:  Goal status: INITIAL  4.  Pt will be independent with HEP.  Baseline:  Goal status: INITIAL    LONG TERM GOALS: Target date: 11/13/2023    Pt will increase knee ext strength by 15lbs for community ambulation. Baseline:  Goal status: INITIAL  2.  Pt will increase hip abd strength by 15lbs for community ambulation.  Baseline:  Goal status: INITIAL  3.  Pt will have 16 point difference in LEFS score for functional improvement. Baseline:  Goal status: INITIAL    PLAN:  PT FREQUENCY: 1x/week  PT DURATION: 6 weeks  PLANNED INTERVENTIONS: 97164- PT Re-evaluation, 97750- Physical Performance  Testing, 97110-Therapeutic exercises, 97530- Therapeutic activity, [id number]- Neuromuscular re-education, 97535- Self Care, 29562- Manual therapy, [phone]- Gait training, [phone]- Aquatic Therapy, [phone]- Electrical stimulation (unattended), [phone]- Electrical stimulation (manual), and 97033- Ionotophoresis 4mg /ml Dexamethasone  PLAN FOR NEXT SESSION: Consider ionto, take pt through gym exercises, manual therapy  Herb Loges, PTA  11/07/23 11:31 AM [facility] [address] [place], [place], [address] Phone: [phone]   Fax:  [phone]

## 2023-11-17 ENCOUNTER — Ambulatory Visit (HOSPITAL_BASED_OUTPATIENT_CLINIC_OR_DEPARTMENT_OTHER)

## 2023-11-17 ENCOUNTER — Encounter (HOSPITAL_BASED_OUTPATIENT_CLINIC_OR_DEPARTMENT_OTHER): Payer: Self-pay

## 2023-11-17 ENCOUNTER — Ambulatory Visit (INDEPENDENT_AMBULATORY_CARE_PROVIDER_SITE_OTHER)

## 2023-11-17 DIAGNOSIS — J309 Allergic rhinitis, unspecified: Secondary | ICD-10-CM | POA: Diagnosis not present

## 2023-11-17 DIAGNOSIS — M25561 Pain in right knee: Secondary | ICD-10-CM | POA: Diagnosis not present

## 2023-11-17 DIAGNOSIS — M6281 Muscle weakness (generalized): Secondary | ICD-10-CM

## 2023-11-17 DIAGNOSIS — R2689 Other abnormalities of gait and mobility: Secondary | ICD-10-CM

## 2023-11-17 NOTE — Therapy (Signed)
 OUTPATIENT PHYSICAL THERAPY LOWER EXTREMITY TREATMENT   Patient Name: Cindy [name] MRN: [id number] DOB:1970/08/31, 53 y.o., female Today's Date: 11/17/2023  END OF SESSION:  PT End of Session - 11/17/23 1434     Visit Number 6    Number of Visits 8    Date for PT Re-Evaluation 11/28/23    PT Start Time 1431    PT Stop Time 1515    PT Time Calculation (min) 44 min    Activity Tolerance Patient tolerated treatment well    Behavior During Therapy [facility] for tasks assessed/performed             Past Medical History:  Diagnosis Date   Asthma    Hypertension    Urticaria    History reviewed. No pertinent surgical history. Patient Active Problem List   Diagnosis Date Noted   Seasonal and perennial allergic rhinitis 06/30/2019   Reactive airway disease 06/30/2019    PCP: Verena Mems, MD  REFERRING PROVIDER: Dasie Fitch, MD  REFERRING DIAG: L knee pain  THERAPY DIAG:  Pes anserine bursitis  Rationale for Evaluation and Treatment: Rehabilitation  ONSET DATE: end of february  SUBJECTIVE:   SUBJECTIVE STATEMENT:  Pt reports she has bene travelling and her knee has bene sore from that. Has been wearing ACE bandage over R knee to help with pain.   Eval: Pt had acute onset of R knee pain in late February. Went to ED and was diagnosed with bursitis. Has difficulty walking and bending down. Feels pain the most when she is walking with 4/10 pain at worst. Knee has gradually been getting stiffer due to muscle tightness. Feels like her gait has been off since the onset of pain. Wants to get back to walking in her neighborhood and possibly work.   PERTINENT HISTORY: Asthma Hypertension Urticaria PAIN:  Are you having pain? no: NPRS scale: 0/10 Pain location: medial joint line, superior to patella Pain description:  Aggravating factors: walking, squatting Relieving factors: stretching  PRECAUTIONS: None  RED FLAGS: None   WEIGHT BEARING RESTRICTIONS:  No  FALLS:  Has patient fallen in last 6 months? No  LIVING ENVIRONMENT: 11-12 stairs  OCCUPATION: nurse  Hobbies:  Going on walks  PLOF: Independent  PATIENT GOALS: decreased pain, increased confidence while walking  NEXT MD VISIT: n/a  OBJECTIVE:  Note: Objective measures were completed at Evaluation unless otherwise noted.  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 No evidence of acute fracture or dislocation. No focal bone lesion or bone destruction. No significant effusions. Soft tissues are unremarkable.  PATIENT SURVEYS:  LEFS 45/80  COGNITION: Overall cognitive status: Within functional limits for tasks assessed     SENSATION: WFL  EDEMA:    MUSCLE LENGTH:   POSTURE: No Significant postural limitations  PALPATION: TTP pes anserine and superior patella   LUMBAR ROM:   Active  A/PROM  eval  Flexion WFL  Extension WFL  Right lateral flexion WFL  Left lateral flexion WFL  Right rotation   Left rotation    (Blank rows = not tested)  LOWER EXTREMITY ROM:  Passive ROM Right eval Left eval  Hip flexion [facility]  Hip extension    Hip abduction [facility]  Hip adduction    Hip internal rotation WFL painful at end range [facility]  Hip external rotation [facility]  Knee flexion    Knee extension  Ankle dorsiflexion    Ankle plantarflexion    Ankle inversion    Ankle eversion     (Blank rows = not tested)  LOWER EXTREMITY MMT:  MMT Right eval Left eval Right  10/23/23  Hip flexion 22.9 27.1 34.1  Hip extension     Hip abduction 37.3 56.9 39.7  Hip adduction     Hip internal rotation     Hip external rotation     Knee flexion     Knee extension 34.5 57.8 46.9  Ankle dorsiflexion     Ankle plantarflexion     Ankle inversion     Ankle eversion      (Blank rows = not tested)  LOWER EXTREMITY SPECIAL  TESTS:    FUNCTIONAL TESTS:     GAIT: Distance walked: 64ft Assistive device utilized: None Level of assistance: Complete Independence Comments: decreased stance time on R, shifting to left because of pain and weakness   Treatment Date:  6/23 Sci fit bike L3 x 5 min Supine stretch with strap- HS, adductors, ITB 2x30sec each RLE Single leg bridge 3 hold 3x10ea SLR with hip ER 1# 2 x 10 SLR with LE neutral 1# 2x10 Sidelying hip abduction taps 2x10ea LAQ 5# 5 3x10ea Sit to stands 2x10 Iontophorsis 4hr patch, 1ml dexamethasone  6/13 Upright bike L3 x 5 min Supine stretch with strap- HS, adductors, ITB 2x30sec each RLE Single leg bridge 3 hold 3x10ea SLR with hip ER 1# 2 x 10 SLR with LE neutral 1# 2x10 Prone HSC 4# 3x10 Prone hip extension 4# 2x10ea Sidelying hip abduction 1# 3x10R LAQ 5# 5 3x10ea Sit to stands 2x10 Posterior slide lunges 2x10ea Hip hinge at wall with 10lb KB x10  6/6 Upright bike L3 x 5 min STM to medial quad/HS, adductors Supine stretch with strap- HS, adductors, ITB 2x30sec each RLE Single leg bridge 3 hold 2x10 SLR with hip ER 2 x 10 SLR on elbows 2 x 10 Prone HSC 3# 2x10 Prone hip extension with knee flexed 2x10 Sidelying hip abduction 2x10ea LAQ 4# 5 3x10 Incline calf stretch 3x30seconds   10/23/23 Upright bike L3 x 5 min Supine stretch with strap- HS, adductors, ITB x 1 min each position (cues for overpressure above knee for improved knee ext) Runners stretch for gastroc x 20s each LE MMT Self care: Pt instructed in self massage with rolling R thigh (hamstring/ adductor)  Sit to Stand in staggered stance x 10 Adductor stretch at counter x 15s x 3  Gait: cues for increased R heel strike, even stance time, and even step length - improved with cues and repetition Seated piriformis stretch -> modified pigeon pose   5/13 STM to medial quad/HS, adductors Supine stretch with strap- HS, adductors, ITB 2x30sec each RLE Piriformis  stretch 30seconds x2 R Bridge 5 hold 2x10 Sidelying clam with GTB 2x15ea Standing adductor stretch 30sec x2ea Incline calf stretch 3x30seconds HEP update/review    5/8 Manual:  Reviewed self soft tissue mobilization to pes area   There-ex:  Seated LAQ red rtb 2x10  Seated hip abd red rtb 2x10 (educated on just doing right side)  Standing calf stretch at wall 2x30sec  Standing bilateral calf stretch 2x30sec  Pt education on exercise grading and progressions    PATIENT EDUCATION:  Education details: HEP, symptom management, self care Person educated: Patient Education method: Explanation, Demonstration, Tactile cues, Verbal cues, and Handouts Education comprehension: verbalized understanding and returned demonstration  HOME EXERCISE PROGRAM: Access Code: [email or web address]  URL: [email or web address] Date: 10/02/2023 Prepared by: Georgetta Moulds  Exercises - Seated Long Arc Quad  - 1 x daily - 7 x weekly - 3 sets - 10 reps - Seated Hip Abduction with Resistance  - 1 x daily - 7 x weekly - 3 sets - 10 reps - Seated Hamstring Stretch  - 1 x daily - 7 x weekly - 3 sets - 10 reps - Standing Gastroc Stretch at Counter  - 1 x daily - 7 x weekly - 3 sets - 10 reps - Standing Gastroc Stretch on Foam 1/2 Roll  - 1 x daily - 7 x weekly - 3 sets - 10 reps  ASSESSMENT:  CLINICAL IMPRESSION: Good tolerance for progressions today. She does remain tender over pes anserin area as well as into medial HS and adductor mm. Denied increased pain with therex. Felt fatigue in lateral hips with s/l taps. Mild off loading of R LE with sit to stands. Trialled iontophoresis today over pes aserine area to decrease inflammation and pain. Instructed pt in proper use and removal of this. Will continue to progress as tolerated.   Eval: Patient is a 53 y.o. female who was seen today for physical therapy evaluation and treatment for pes anserine bursitis. Symptoms were brought out with  palpation and knee flex. Gait was assessed with decreased stance time on R leg and shifting away due to pain and weakness. MMT was noted with biggest differences in knee ext and hip abd. Pt was instructed on nature of condition and treatment strategies moving forward. Goal is to get back to walkig in the neighborhood and have more confidence in community ambulation. Therapeutic exercises were done with education on grading, progressions, stretching times, and symptom management. Next visit can progress exercises with showing gym alternatives.  Ionoto should also be considered. Pt tolerated all treatment strategies well today with no increase in symptoms. Pt will continue to benefit from skilled physical therapy to reduce irritability and increase functional strength for ADL's and recreational activities.   OBJECTIVE IMPAIRMENTS: Abnormal gait, decreased coordination, decreased endurance, decreased mobility, difficulty walking, decreased ROM, and decreased strength.   ACTIVITY LIMITATIONS: carrying, lifting, bending, squatting, and toileting  PARTICIPATION LIMITATIONS: cleaning, laundry, shopping, community activity, and occupation  PERSONAL FACTORS: 1-2 comorbidities: asthma,  are also affecting patient's functional outcome. Occupation   REHAB POTENTIAL: Good  CLINICAL DECISION MAKING: Evolving/moderate complexity  EVALUATION COMPLEXITY: Moderate   GOALS: Goals reviewed with patient? Yes  SHORT TERM GOALS: Target date: 10/23/2023   Pt will increase knee ext strength by 5lbs for community ambulation. Baseline: Goal status: MET - 10/23/23  2.  Pt will increase hip abd strength by 5lbs for community ambulation.  Baseline:  Goal status: IN  PROGRESS -10/23/23  3.  Pt will have 8 point difference in LEFS score for functional improvement.  Baseline:  Goal status: INITIAL  4.  Pt will be independent with HEP.  Baseline:  Goal status: MET 6/23    LONG TERM GOALS: Target date:  11/13/2023    Pt will increase knee ext strength by 15lbs for community ambulation. Baseline:  Goal status: INITIAL  2.  Pt will increase hip abd strength by 15lbs for community ambulation.  Baseline:  Goal status: INITIAL  3.  Pt will have 16 point difference in LEFS score for functional improvement. Baseline:  Goal status: INITIAL    PLAN:  PT FREQUENCY: 1x/week  PT DURATION: 6 weeks  PLANNED INTERVENTIONS: 02835- PT Re-evaluation, 97750- Physical Performance Testing, 97110-Therapeutic  exercises, 97530- Therapeutic activity, [id number]- Neuromuscular re-education, [phone]- Self Care, 02859- Manual therapy, [phone]- Gait training, [phone]- Aquatic Therapy, [phone]- Electrical stimulation (unattended), [phone]- Electrical stimulation (manual), and 97033- Ionotophoresis 4mg /ml Dexamethasone  PLAN FOR NEXT SESSION: Consider ionto, take pt through gym exercises, manual therapy  Asberry Rodes, PTA  11/17/23 4:15 PM [facility] [address] [place], [place], [address] Phone: [phone]   Fax:  [phone]

## 2023-11-27 ENCOUNTER — Other Ambulatory Visit (HOSPITAL_COMMUNITY): Payer: Self-pay | Admitting: Sports Medicine

## 2023-11-27 DIAGNOSIS — M25561 Pain in right knee: Secondary | ICD-10-CM

## 2023-12-01 ENCOUNTER — Encounter (HOSPITAL_BASED_OUTPATIENT_CLINIC_OR_DEPARTMENT_OTHER): Payer: Self-pay | Admitting: Physical Therapy

## 2023-12-01 ENCOUNTER — Ambulatory Visit (HOSPITAL_BASED_OUTPATIENT_CLINIC_OR_DEPARTMENT_OTHER): Attending: Sports Medicine | Admitting: Physical Therapy

## 2023-12-01 DIAGNOSIS — M25561 Pain in right knee: Secondary | ICD-10-CM | POA: Insufficient documentation

## 2023-12-01 DIAGNOSIS — M6281 Muscle weakness (generalized): Secondary | ICD-10-CM | POA: Insufficient documentation

## 2023-12-01 DIAGNOSIS — R2689 Other abnormalities of gait and mobility: Secondary | ICD-10-CM | POA: Insufficient documentation

## 2023-12-01 NOTE — Therapy (Signed)
 OUTPATIENT PHYSICAL THERAPY LOWER EXTREMITY TREATMENT   Patient Name: Cindy [name] MRN: [id number] DOB:Dec 15, 1970, 53 y.o., female Today's Date: 12/01/2023  END OF SESSION:  PT End of Session - 12/01/23 1152     Visit Number 8    Number of Visits 8    Date for PT Re-Evaluation 01/12/24    PT Start Time 1146    PT Stop Time 1228    PT Time Calculation (min) 42 min    Activity Tolerance Patient tolerated treatment well    Behavior During Therapy [facility] for tasks assessed/performed             Past Medical History:  Diagnosis Date   Asthma    Hypertension    Urticaria    History reviewed. No pertinent surgical history. Patient Active Problem List   Diagnosis Date Noted   Seasonal and perennial allergic rhinitis 06/30/2019   Reactive airway disease 06/30/2019    PCP: Verena Mems, MD  REFERRING PROVIDER: Dasie Fitch, MD  REFERRING DIAG: L knee pain  THERAPY DIAG:  Pes anserine bursitis  Rationale for Evaluation and Treatment: Rehabilitation  ONSET DATE: end of february  SUBJECTIVE:   SUBJECTIVE STATEMENT:  The patient reports the sharp pain went away for about a week. She traveled and came back and had an increase in pain but it is not as bad as it has been.    Eval: Pt had acute onset of R knee pain in late February. Went to ED and was diagnosed with bursitis. Has difficulty walking and bending down. Feels pain the most when she is walking with 4/10 pain at worst. Knee has gradually been getting stiffer due to muscle tightness. Feels like her gait has been off since the onset of pain. Wants to get back to walking in her neighborhood and possibly work.   PERTINENT HISTORY: Asthma Hypertension Urticaria PAIN:  Are you having pain? no: NPRS scale: 0/10 Pain location: medial joint line, superior to patella Pain description:  Aggravating factors: walking, squatting Relieving factors: stretching  PRECAUTIONS: None  RED FLAGS: None   WEIGHT BEARING  RESTRICTIONS: No  FALLS:  Has patient fallen in last 6 months? No  LIVING ENVIRONMENT: 11-12 stairs  OCCUPATION: nurse  Hobbies:  Going on walks  PLOF: Independent  PATIENT GOALS: decreased pain, increased confidence while walking  NEXT MD VISIT: n/a  OBJECTIVE:  Note: Objective measures were completed at Evaluation unless otherwise noted.  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 No evidence of acute fracture or dislocation. No focal bone lesion or bone destruction. No significant effusions. Soft tissues are unremarkable.  PATIENT SURVEYS:  LEFS 45/80  7/7 LEFS 56/80  COGNITION: Overall cognitive status: Within functional limits for tasks assessed     SENSATION: WFL  EDEMA:    MUSCLE LENGTH:   POSTURE: No Significant postural limitations  PALPATION: TTP pes anserine and superior patella   LUMBAR ROM:   Active  A/PROM  eval  Flexion WFL  Extension WFL  Right lateral flexion WFL  Left lateral flexion WFL  Right rotation   Left rotation    (Blank rows = not tested)  LOWER EXTREMITY ROM:  Passive ROM Right eval Left eval  Hip flexion [facility]  Hip extension    Hip abduction [facility]  Hip adduction    Hip internal rotation WFL painful at end range [facility]  Hip external rotation  [facility] WFL  Knee flexion    Knee extension    Ankle dorsiflexion    Ankle plantarflexion    Ankle inversion    Ankle eversion     (Blank rows = not tested)  LOWER EXTREMITY MMT:  MMT Right eval Left eval Right  10/23/23 Right  7/7 Left 7/7  Hip flexion 22.9 27.1 34.1 37.1 37.6  Hip extension       Hip abduction 37.3 56.9 39.7 43.6  51.5  Hip adduction       Hip internal rotation       Hip external rotation       Knee flexion       Knee extension 34.5 57.8 46.9 41.9 49.1  Ankle dorsiflexion       Ankle plantarflexion        Ankle inversion       Ankle eversion        (Blank rows = not tested)  LOWER EXTREMITY SPECIAL TESTS:    FUNCTIONAL TESTS:     GAIT: Distance walked: 59ft Assistive device utilized: None Level of assistance: Complete Independence Comments: decreased stance time on R, shifting to left because of pain and weakness   Treatment Date: 7/7  There-ex: Nu-step 5 min L5   Leg press x12 50 lbs RPE of 2  X12 70 lbs RPE 3  X12 80 RPE of 3   LAQ  X12 10 lbs RPE of 3 but felt like she was uisng the left more  X12 up two down with just the right  Strength testing and review of strength numbers   Manual: trigger point release to medial gastroc and medial hamstring  Extension stretching with distraction  Review of self extension stretching.      Ionto: 1 ML to pes anserine dexmethesone slow release patch      6/23 Sci fit bike L3 x 5 min Supine stretch with strap- HS, adductors, ITB 2x30sec each RLE Single leg bridge 3 hold 3x10ea SLR with hip ER 1# 2 x 10 SLR with LE neutral 1# 2x10 Sidelying hip abduction taps 2x10ea LAQ 5# 5 3x10ea Sit to stands 2x10 Iontophorsis 4hr patch, 1ml dexamethasone  6/13 Upright bike L3 x 5 min Supine stretch with strap- HS, adductors, ITB 2x30sec each RLE Single leg bridge 3 hold 3x10ea SLR with hip ER 1# 2 x 10 SLR with LE neutral 1# 2x10 Prone HSC 4# 3x10 Prone hip extension 4# 2x10ea Sidelying hip abduction 1# 3x10R LAQ 5# 5 3x10ea Sit to stands 2x10 Posterior slide lunges 2x10ea Hip hinge at wall with 10lb KB x10  6/6 Upright bike L3 x 5 min STM to medial quad/HS, adductors Supine stretch with strap- HS, adductors, ITB 2x30sec each RLE Single leg bridge 3 hold 2x10 SLR with hip ER 2 x 10 SLR on elbows 2 x 10 Prone HSC 3# 2x10 Prone hip extension with knee flexed 2x10 Sidelying hip abduction 2x10ea LAQ 4# 5 3x10 Incline calf stretch 3x30seconds   10/23/23 Upright bike L3 x 5 min Supine stretch with strap-  HS, adductors, ITB x 1 min each position (cues for overpressure above knee for improved knee ext) Runners stretch for gastroc x 20s each LE MMT Self care: Pt instructed in self massage with rolling R thigh (hamstring/ adductor)  Sit to Stand in staggered stance x 10 Adductor stretch at counter x 15s x 3  Gait: cues for increased R heel strike, even stance time, and even step length - improved with cues and  repetition Seated piriformis stretch -> modified pigeon pose   5/13 STM to medial quad/HS, adductors Supine stretch with strap- HS, adductors, ITB 2x30sec each RLE Piriformis stretch 30seconds x2 R Bridge 5 hold 2x10 Sidelying clam with GTB 2x15ea Standing adductor stretch 30sec x2ea Incline calf stretch 3x30seconds HEP update/review    5/8 Manual:  Reviewed self soft tissue mobilization to pes area   There-ex:  Seated LAQ red rtb 2x10  Seated hip abd red rtb 2x10 (educated on just doing right side)  Standing calf stretch at wall 2x30sec  Standing bilateral calf stretch 2x30sec  Pt education on exercise grading and progressions    PATIENT EDUCATION:  Education details: HEP, symptom management, self care Person educated: Patient Education method: Explanation, Demonstration, Tactile cues, Verbal cues, and Handouts Education comprehension: verbalized understanding and returned demonstration  HOME EXERCISE PROGRAM: Access Code: [email or web address] URL: [email or web address] Date: 10/02/2023 Prepared by: Georgetta Moulds  Exercises - Seated Long Arc Quad  - 1 x daily - 7 x weekly - 3 sets - 10 reps - Seated Hip Abduction with Resistance  - 1 x daily - 7 x weekly - 3 sets - 10 reps - Seated Hamstring Stretch  - 1 x daily - 7 x weekly - 3 sets - 10 reps - Standing Gastroc Stretch at Counter  - 1 x daily - 7 x weekly - 3 sets - 10 reps - Standing Gastroc Stretch on Foam 1/2 Roll  - 1 x daily - 7 x weekly - 3 sets - 10 reps  ASSESSMENT:  CLINICAL  IMPRESSION: The patient was shown how to do self extension stretching and self soft tissue mobilization to trigger points in the calf and hamstrings insertion. Overall she is progressing. She is having less pain standing and walking but she is still limited in distance. Her strength and LEFS score have improved. We continue to trial Ionto. We reviewed gym exercises today and how to grade. Should would benefit from further skilled therapy 1W8.    Eval: Patient is a 53 y.o. female who was seen today for physical therapy evaluation and treatment for pes anserine bursitis. Symptoms were brought out with palpation and knee flex. Gait was assessed with decreased stance time on R leg and shifting away due to pain and weakness. MMT was noted with biggest differences in knee ext and hip abd. Pt was instructed on nature of condition and treatment strategies moving forward. Goal is to get back to walkig in the neighborhood and have more confidence in community ambulation. Therapeutic exercises were done with education on grading, progressions, stretching times, and symptom management. Next visit can progress exercises with showing gym alternatives.  Ionoto should also be considered. Pt tolerated all treatment strategies well today with no increase in symptoms. Pt will continue to benefit from skilled physical therapy to reduce irritability and increase functional strength for ADL's and recreational activities.   OBJECTIVE IMPAIRMENTS: Abnormal gait, decreased coordination, decreased endurance, decreased mobility, difficulty walking, decreased ROM, and decreased strength.   ACTIVITY LIMITATIONS: carrying, lifting, bending, squatting, and toileting  PARTICIPATION LIMITATIONS: cleaning, laundry, shopping, community activity, and occupation  PERSONAL FACTORS: 1-2 comorbidities: asthma,  are also affecting patient's functional outcome. Occupation   REHAB POTENTIAL: Good  CLINICAL DECISION MAKING: Evolving/moderate  complexity  EVALUATION COMPLEXITY: Moderate   GOALS: Goals reviewed with patient? Yes  SHORT TERM GOALS: Target date: 10/23/2023   Pt will increase knee ext strength by 5lbs for community ambulation. Baseline: Goal status: MET - 10/23/23  2.  Pt will increase hip abd strength by 5lbs for community ambulation.  Baseline:  Goal status: IN  PROGRESS -10/23/23  3.  Pt will have 8 point difference in LEFS score for functional improvement.  Baseline:  Goal status: INITIAL  4.  Pt will be independent with HEP.  Baseline:  Goal status: MET 6/23    LONG TERM GOALS: Target date: 11/13/2023    Pt will increase knee ext strength by 15lbs for community ambulation. Baseline:  Goal status: INITIAL  2.  Pt will increase hip abd strength by 15lbs for community ambulation.  Baseline:  Goal status: INITIAL  3.  Pt will have 16 point difference in LEFS score for functional improvement. Baseline:  Goal status: INITIAL    PLAN:  PT FREQUENCY: 1x/week  PT DURATION: 6 weeks  PLANNED INTERVENTIONS: 97164- PT Re-evaluation, 97750- Physical Performance Testing, 97110-Therapeutic exercises, 97530- Therapeutic activity, [id number]- Neuromuscular re-education, 97535- Self Care, 02859- Manual therapy, [phone]- Gait training, [phone]- Aquatic Therapy, [phone]- Electrical stimulation (unattended), [phone]- Electrical stimulation (manual), and [id number]- Ionotophoresis 4mg /ml Dexamethasone  PLAN FOR NEXT SESSION: Consider ionto, take pt through gym exercises, manual therapy Alm Don PT  12/01/23 11:54 AM [facility] [address] [place], [place], [address] Phone: [phone]   Fax:  [phone]

## 2023-12-02 ENCOUNTER — Encounter (HOSPITAL_BASED_OUTPATIENT_CLINIC_OR_DEPARTMENT_OTHER): Payer: Self-pay | Admitting: Physical Therapy

## 2023-12-03 ENCOUNTER — Ambulatory Visit (HOSPITAL_COMMUNITY): Admission: RE | Admit: 2023-12-03 | Source: Ambulatory Visit

## 2023-12-03 ENCOUNTER — Other Ambulatory Visit: Payer: Self-pay | Admitting: Sports Medicine

## 2023-12-03 DIAGNOSIS — M25561 Pain in right knee: Secondary | ICD-10-CM

## 2023-12-09 ENCOUNTER — Ambulatory Visit (HOSPITAL_BASED_OUTPATIENT_CLINIC_OR_DEPARTMENT_OTHER)

## 2023-12-09 ENCOUNTER — Encounter (HOSPITAL_BASED_OUTPATIENT_CLINIC_OR_DEPARTMENT_OTHER): Payer: Self-pay

## 2023-12-09 DIAGNOSIS — R2689 Other abnormalities of gait and mobility: Secondary | ICD-10-CM

## 2023-12-09 DIAGNOSIS — M6281 Muscle weakness (generalized): Secondary | ICD-10-CM

## 2023-12-09 DIAGNOSIS — M25561 Pain in right knee: Secondary | ICD-10-CM | POA: Diagnosis not present

## 2023-12-09 NOTE — Therapy (Signed)
 OUTPATIENT PHYSICAL THERAPY LOWER EXTREMITY TREATMENT   Patient Name: Cindy [name] MRN: [id number] DOB:01/16/1971, 53 y.o., female Today's Date: 12/09/2023  END OF SESSION:  PT End of Session - 12/09/23 1540     Visit Number 9    Number of Visits 16    Date for PT Re-Evaluation 01/27/24    PT Start Time 1519    PT Stop Time 1600    PT Time Calculation (min) 41 min    Activity Tolerance Patient tolerated treatment well    Behavior During Therapy WFL for tasks assessed/performed              Past Medical History:  Diagnosis Date   Asthma    Hypertension    Urticaria    History reviewed. No pertinent surgical history. Patient Active Problem List   Diagnosis Date Noted   Seasonal and perennial allergic rhinitis 06/30/2019   Reactive airway disease 06/30/2019    PCP: Verena Mems, MD  REFERRING PROVIDER: Dasie Fitch, MD  REFERRING DIAG: L knee pain  THERAPY DIAG:  Pes anserine bursitis  Rationale for Evaluation and Treatment: Rehabilitation  ONSET DATE: end of february  SUBJECTIVE:   SUBJECTIVE STATEMENT:  Pt reports she has had no incidence of sharp pain the last 3-4days. Feels like the bike helps her loosen up.    Eval: Pt had acute onset of R knee pain in late February. Went to ED and was diagnosed with bursitis. Has difficulty walking and bending down. Feels pain the most when she is walking with 4/10 pain at worst. Knee has gradually been getting stiffer due to muscle tightness. Feels like her gait has been off since the onset of pain. Wants to get back to walking in her neighborhood and possibly work.   PERTINENT HISTORY: Asthma Hypertension Urticaria PAIN:  Are you having pain? no: NPRS scale: 0/10 Pain location: medial joint line, superior to patella Pain description:  Aggravating factors: walking, squatting Relieving factors: stretching  PRECAUTIONS: None  RED FLAGS: None   WEIGHT BEARING RESTRICTIONS: No  FALLS:  Has patient  fallen in last 6 months? No  LIVING ENVIRONMENT: 11-12 stairs  OCCUPATION: nurse  Hobbies:  Going on walks  PLOF: Independent  PATIENT GOALS: decreased pain, increased confidence while walking  NEXT MD VISIT: n/a  OBJECTIVE:  Note: Objective measures were completed at Evaluation unless otherwise noted.  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 No evidence of acute fracture or dislocation. No focal bone lesion or bone destruction. No significant effusions. Soft tissues are unremarkable.  PATIENT SURVEYS:  LEFS 45/80  7/7 LEFS 56/80  COGNITION: Overall cognitive status: Within functional limits for tasks assessed     SENSATION: WFL  EDEMA:    MUSCLE LENGTH:   POSTURE: No Significant postural limitations  PALPATION: TTP pes anserine and superior patella   LUMBAR ROM:   Active  A/PROM  eval  Flexion WFL  Extension WFL  Right lateral flexion WFL  Left lateral flexion WFL  Right rotation   Left rotation    (Blank rows = not tested)  LOWER EXTREMITY ROM:  Passive ROM Right eval Left eval  Hip flexion [facility]  Hip extension    Hip abduction [facility]  Hip adduction    Hip internal rotation WFL painful at end range [facility]  Hip external rotation [facility]  Knee flexion    Knee extension  Ankle dorsiflexion    Ankle plantarflexion    Ankle inversion    Ankle eversion     (Blank rows = not tested)  LOWER EXTREMITY MMT:  MMT Right eval Left eval Right  10/23/23 Right  7/7 Left 7/7  Hip flexion 22.9 27.1 34.1 37.1 37.6  Hip extension       Hip abduction 37.3 56.9 39.7 43.6  51.5  Hip adduction       Hip internal rotation       Hip external rotation       Knee flexion       Knee extension 34.5 57.8 46.9 41.9 49.1  Ankle dorsiflexion       Ankle plantarflexion       Ankle inversion       Ankle eversion         (Blank rows = not tested)  LOWER EXTREMITY SPECIAL TESTS:    FUNCTIONAL TESTS:     GAIT: Distance walked: 35ft Assistive device utilized: None Level of assistance: Complete Independence Comments: decreased stance time on R, shifting to left because of pain and weakness   Treatment Date:  7/15 Upright bike L5 xmin PROm R knee STM to medial HS/adductors Patella mobilizations Prone hip extension with knee flexed 2x15ea S/l abduction with taps 2x15ea Fig 4 bridge 2x15ea Lunge step backs at rail x10ea  SLDL with reach to 6 step x10ea HS stretch with heel on 6 step 30sec x1ea Eccentric fwd step down 6 2x10ea  7/7  There-ex: Nu-step 5 min L5   Leg press x12 50 lbs RPE of 2  X12 70 lbs RPE 3  X12 80 RPE of 3   LAQ  X12 10 lbs RPE of 3 but felt like she was uisng the left more  X12 up two down with just the right  Strength testing and review of strength numbers   Manual: trigger point release to medial gastroc and medial hamstring  Extension stretching with distraction  Review of self extension stretching.      Ionto: 1 ML to pes anserine dexmethesone slow release patch      6/23 Sci fit bike L3 x 5 min Supine stretch with strap- HS, adductors, ITB 2x30sec each RLE Single leg bridge 3 hold 3x10ea SLR with hip ER 1# 2 x 10 SLR with LE neutral 1# 2x10 Sidelying hip abduction taps 2x10ea LAQ 5# 5 3x10ea Sit to stands 2x10 Iontophorsis 4hr patch, 1ml dexamethasone  6/13 Upright bike L3 x 5 min Supine stretch with strap- HS, adductors, ITB 2x30sec each RLE Single leg bridge 3 hold 3x10ea SLR with hip ER 1# 2 x 10 SLR with LE neutral 1# 2x10 Prone HSC 4# 3x10 Prone hip extension 4# 2x10ea Sidelying hip abduction 1# 3x10R LAQ 5# 5 3x10ea Sit to stands 2x10 Posterior slide lunges 2x10ea Hip hinge at wall with 10lb KB x10    PATIENT EDUCATION:  Education details: HEP, symptom management, self care Person educated: Patient Education  method: Explanation, Demonstration, Tactile cues, Verbal cues, and Handouts Education comprehension: verbalized understanding and returned demonstration  HOME EXERCISE PROGRAM: Access Code: [email or web address] URL: [email or web address] Date: 10/02/2023 Prepared by: Georgetta Moulds  Exercises - Seated Long Arc Quad  - 1 x daily - 7 x weekly - 3 sets - 10 reps - Seated Hip Abduction with Resistance  - 1 x daily - 7 x weekly - 3 sets - 10 reps - Seated Hamstring Stretch  - 1 x daily - 7 x weekly - 3  sets - 10 reps - Standing Gastroc Stretch at Counter  - 1 x daily - 7 x weekly - 3 sets - 10 reps - Standing Gastroc Stretch on Foam 1/2 Roll  - 1 x daily - 7 x weekly - 3 sets - 10 reps  ASSESSMENT:  CLINICAL IMPRESSION: Pt demonstrates ongoing weakness in R LE compared to L. Muscular fatigue present with all tasks, though able to complete all reps. Added with SLDL to 6 box, which was challenging. Pt responded well to verbal cuing for form and muscle activation. Pt will benefit from ongoing PT to return strength to prior and functional level while monitoring pain response.   Eval: Patient is a 53 y.o. female who was seen today for physical therapy evaluation and treatment for pes anserine bursitis. Symptoms were brought out with palpation and knee flex. Gait was assessed with decreased stance time on R leg and shifting away due to pain and weakness. MMT was noted with biggest differences in knee ext and hip abd. Pt was instructed on nature of condition and treatment strategies moving forward. Goal is to get back to walkig in the neighborhood and have more confidence in community ambulation. Therapeutic exercises were done with education on grading, progressions, stretching times, and symptom management. Next visit can progress exercises with showing gym alternatives.  Ionoto should also be considered. Pt tolerated all treatment strategies well today with no increase in symptoms. Pt will continue to  benefit from skilled physical therapy to reduce irritability and increase functional strength for ADL's and recreational activities.   OBJECTIVE IMPAIRMENTS: Abnormal gait, decreased coordination, decreased endurance, decreased mobility, difficulty walking, decreased ROM, and decreased strength.   ACTIVITY LIMITATIONS: carrying, lifting, bending, squatting, and toileting  PARTICIPATION LIMITATIONS: cleaning, laundry, shopping, community activity, and occupation  PERSONAL FACTORS: 1-2 comorbidities: asthma,  are also affecting patient's functional outcome. Occupation   REHAB POTENTIAL: Good  CLINICAL DECISION MAKING: Evolving/moderate complexity  EVALUATION COMPLEXITY: Moderate   GOALS: Goals reviewed with patient? Yes  SHORT TERM GOALS: Target date: 10/23/2023   Pt will increase knee ext strength by 5lbs for community ambulation. Baseline: Goal status: MET - 10/23/23  2.  Pt will increase hip abd strength by 5lbs for community ambulation.  Baseline:  Goal status: IN  PROGRESS -10/23/23  3.  Pt will have 8 point difference in LEFS score for functional improvement.  Baseline:  Goal status: INITIAL  4.  Pt will be independent with HEP.  Baseline:  Goal status: MET 6/23    LONG TERM GOALS: Target date: 11/13/2023    Pt will increase knee ext strength by 15lbs for community ambulation. Baseline:  Goal status: INITIAL  2.  Pt will increase hip abd strength by 15lbs for community ambulation.  Baseline:  Goal status: INITIAL  3.  Pt will have 16 point difference in LEFS score for functional improvement. Baseline:  Goal status: INITIAL    PLAN:  PT FREQUENCY: 1x/week  PT DURATION: 6 weeks  PLANNED INTERVENTIONS: 97164- PT Re-evaluation, 97750- Physical Performance Testing, 97110-Therapeutic exercises, 97530- Therapeutic activity, [id number]- Neuromuscular re-education, [phone]- Self Care, 02859- Manual therapy, [phone]- Gait training, [phone]- Aquatic Therapy, [phone]-  Electrical stimulation (unattended), [phone]- Electrical stimulation (manual), and 97033- Ionotophoresis 4mg /ml Dexamethasone  PLAN FOR NEXT SESSION: Consider ionto, take pt through gym exercises, manual therapy Asberry Rodes, PTA  12/09/23 5:14 PM [facility] [address] [place], [place], [address] Phone: [phone]   Fax:  [phone]

## 2023-12-12 ENCOUNTER — Ambulatory Visit (HOSPITAL_COMMUNITY)

## 2023-12-12 ENCOUNTER — Encounter (HOSPITAL_COMMUNITY): Payer: Self-pay

## 2023-12-16 ENCOUNTER — Ambulatory Visit (HOSPITAL_BASED_OUTPATIENT_CLINIC_OR_DEPARTMENT_OTHER): Admitting: Physical Therapy

## 2023-12-16 DIAGNOSIS — M25561 Pain in right knee: Secondary | ICD-10-CM

## 2023-12-16 DIAGNOSIS — R2689 Other abnormalities of gait and mobility: Secondary | ICD-10-CM

## 2023-12-16 DIAGNOSIS — M6281 Muscle weakness (generalized): Secondary | ICD-10-CM

## 2023-12-16 NOTE — Therapy (Signed)
 OUTPATIENT PHYSICAL THERAPY LOWER EXTREMITY TREATMENT   Patient Name: Cindy [name] MRN: [id number] DOB:1970-10-15, 53 y.o., female Today's Date: 12/17/2023  END OF SESSION:  PT End of Session - 12/17/23 1134     Visit Number 10    Number of Visits 16    Date for PT Re-Evaluation 01/27/24    PT Start Time 1515    PT Stop Time 1558    PT Time Calculation (min) 43 min    Activity Tolerance Patient tolerated treatment well    Behavior During Therapy WFL for tasks assessed/performed               Past Medical History:  Diagnosis Date   Asthma    Hypertension    Urticaria    History reviewed. No pertinent surgical history. Patient Active Problem List   Diagnosis Date Noted   Seasonal and perennial allergic rhinitis 06/30/2019   Reactive airway disease 06/30/2019    PCP: Verena Mems, MD  REFERRING PROVIDER: Dasie Fitch, MD  REFERRING DIAG: L knee pain  THERAPY DIAG:  Pes anserine bursitis  Rationale for Evaluation and Treatment: Rehabilitation  ONSET DATE: end of february  SUBJECTIVE:   SUBJECTIVE STATEMENT:  Patient has had several days without pain. She has been walking more and doing more things around the house.     Eval: Pt had acute onset of R knee pain in late February. Went to ED and was diagnosed with bursitis. Has difficulty walking and bending down. Feels pain the most when she is walking with 4/10 pain at worst. Knee has gradually been getting stiffer due to muscle tightness. Feels like her gait has been off since the onset of pain. Wants to get back to walking in her neighborhood and possibly work.   PERTINENT HISTORY: Asthma Hypertension Urticaria PAIN:  Are you having pain? no: NPRS scale: 0/10 Pain location: medial joint line, superior to patella Pain description:  Aggravating factors: walking, squatting Relieving factors: stretching  PRECAUTIONS: None  RED FLAGS: None   WEIGHT BEARING RESTRICTIONS: No  FALLS:  Has  patient fallen in last 6 months? No  LIVING ENVIRONMENT: 11-12 stairs  OCCUPATION: nurse  Hobbies:  Going on walks  PLOF: Independent  PATIENT GOALS: decreased pain, increased confidence while walking  NEXT MD VISIT: n/a  OBJECTIVE:  Note: Objective measures were completed at Evaluation unless otherwise noted.  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 No evidence of acute fracture or dislocation. No focal bone lesion or bone destruction. No significant effusions. Soft tissues are unremarkable.  PATIENT SURVEYS:  LEFS 45/80  7/7 LEFS 56/80  COGNITION: Overall cognitive status: Within functional limits for tasks assessed     SENSATION: WFL  EDEMA:    MUSCLE LENGTH:   POSTURE: No Significant postural limitations  PALPATION: TTP pes anserine and superior patella   LUMBAR ROM:   Active  A/PROM  eval  Flexion WFL  Extension WFL  Right lateral flexion WFL  Left lateral flexion WFL  Right rotation   Left rotation    (Blank rows = not tested)  LOWER EXTREMITY ROM:  Passive ROM Right eval Left eval  Hip flexion [facility]  Hip extension    Hip abduction [facility]  Hip adduction    Hip internal rotation WFL painful at end range [facility]  Hip external rotation [facility]  Knee flexion    Knee extension  Ankle dorsiflexion    Ankle plantarflexion    Ankle inversion    Ankle eversion     (Blank rows = not tested)  LOWER EXTREMITY MMT:  MMT Right eval Left eval Right  10/23/23 Right  7/7 Left 7/7  Hip flexion 22.9 27.1 34.1 37.1 37.6  Hip extension       Hip abduction 37.3 56.9 39.7 43.6  51.5  Hip adduction       Hip internal rotation       Hip external rotation       Knee flexion       Knee extension 34.5 57.8 46.9 41.9 49.1  Ankle dorsiflexion       Ankle plantarflexion       Ankle inversion       Ankle  eversion        (Blank rows = not tested)  LOWER EXTREMITY SPECIAL TESTS:    FUNCTIONAL TESTS:     GAIT: Distance walked: 33ft Assistive device utilized: None Level of assistance: Complete Independence Comments: decreased stance time on R, shifting to left because of pain and weakness   Treatment Date: 7/22  Manual: trigger point release to medial gastroc and medial hamstring  Extension stretching with distraction  Review of self extension stretching.  Patella mobilization   Neuro-re-ed:  Fig 4 bridge 2x15ea Step onto air-ex fwd and lateral 2x10    There-ex:  SLR 3x10   Leg press x12 50 lbs RPE of 2  X12 70 lbs RPE 3  X12 80 RPE of 3   Knee extension   3X12 10 lbs RPE of 3   Hip abduction 3x12 55 lbs       7/15 Upright bike L5 xmin PROm R knee STM to medial HS/adductors Patella mobilizations Prone hip extension with knee flexed 2x15ea S/l abduction with taps 2x15ea Fig 4 bridge 2x15ea Lunge step backs at rail x10ea  SLDL with reach to 6 step x10ea HS stretch with heel on 6 step 30sec x1ea Eccentric fwd step down 6 2x10ea  7/7  There-ex: Nu-step 5 min L5   Leg press x12 50 lbs RPE of 2  X12 70 lbs RPE 3  X12 80 RPE of 3   LAQ  X12 10 lbs RPE of 3 but felt like she was uisng the left more  X12 up two down with just the right  Strength testing and review of strength numbers   Manual: trigger point release to medial gastroc and medial hamstring  Extension stretching with distraction  Review of self extension stretching.      Ionto: 1 ML to pes anserine dexmethesone slow release patch      6/23 Sci fit bike L3 x 5 min Supine stretch with strap- HS, adductors, ITB 2x30sec each RLE Single leg bridge 3 hold 3x10ea SLR with hip ER 1# 2 x 10 SLR with LE neutral 1# 2x10 Sidelying hip abduction taps 2x10ea LAQ 5# 5 3x10ea Sit to stands 2x10 Iontophorsis 4hr patch, 1ml dexamethasone  6/13 Upright bike L3 x 5 min Supine stretch  with strap- HS, adductors, ITB 2x30sec each RLE Single leg bridge 3 hold 3x10ea SLR with hip ER 1# 2 x 10 SLR with LE neutral 1# 2x10 Prone HSC 4# 3x10 Prone hip extension 4# 2x10ea Sidelying hip abduction 1# 3x10R LAQ 5# 5 3x10ea Sit to stands 2x10 Posterior slide lunges 2x10ea Hip hinge at wall with 10lb KB x10    PATIENT EDUCATION:  Education details: HEP, symptom management, self  care Person educated: Patient Education method: Explanation, Demonstration, Tactile cues, Verbal cues, and Handouts Education comprehension: verbalized understanding and returned demonstration  HOME EXERCISE PROGRAM: Access Code: [email or web address] URL: [email or web address] Date: 10/02/2023 Prepared by: Georgetta Moulds  Exercises - Seated Long Arc Quad  - 1 x daily - 7 x weekly - 3 sets - 10 reps - Seated Hip Abduction with Resistance  - 1 x daily - 7 x weekly - 3 sets - 10 reps - Seated Hamstring Stretch  - 1 x daily - 7 x weekly - 3 sets - 10 reps - Standing Gastroc Stretch at Counter  - 1 x daily - 7 x weekly - 3 sets - 10 reps - Standing Gastroc Stretch on Foam 1/2 Roll  - 1 x daily - 7 x weekly - 3 sets - 10 reps  ASSESSMENT:  CLINICAL IMPRESSION: The patient is progressing. We reviewed instability activity she can do at the gym. We also reviewed her gym program. She tolerated well. She had mild pain with knee extensions. Therapy will continue to progress as tolerated.     Eval: Patient is a 53 y.o. female who was seen today for physical therapy evaluation and treatment for pes anserine bursitis. Symptoms were brought out with palpation and knee flex. Gait was assessed with decreased stance time on R leg and shifting away due to pain and weakness. MMT was noted with biggest differences in knee ext and hip abd. Pt was instructed on nature of condition and treatment strategies moving forward. Goal is to get back to walkig in the neighborhood and have more confidence in community ambulation.  Therapeutic exercises were done with education on grading, progressions, stretching times, and symptom management. Next visit can progress exercises with showing gym alternatives.  Ionoto should also be considered. Pt tolerated all treatment strategies well today with no increase in symptoms. Pt will continue to benefit from skilled physical therapy to reduce irritability and increase functional strength for ADL's and recreational activities.   OBJECTIVE IMPAIRMENTS: Abnormal gait, decreased coordination, decreased endurance, decreased mobility, difficulty walking, decreased ROM, and decreased strength.   ACTIVITY LIMITATIONS: carrying, lifting, bending, squatting, and toileting  PARTICIPATION LIMITATIONS: cleaning, laundry, shopping, community activity, and occupation  PERSONAL FACTORS: 1-2 comorbidities: asthma,  are also affecting patient's functional outcome. Occupation   REHAB POTENTIAL: Good  CLINICAL DECISION MAKING: Evolving/moderate complexity  EVALUATION COMPLEXITY: Moderate   GOALS: Goals reviewed with patient? Yes  SHORT TERM GOALS: Target date: 10/23/2023   Pt will increase knee ext strength by 5lbs for community ambulation. Baseline: Goal status: MET - 10/23/23  2.  Pt will increase hip abd strength by 5lbs for community ambulation.  Baseline:  Goal status: IN  PROGRESS -10/23/23  3.  Pt will have 8 point difference in LEFS score for functional improvement.  Baseline:  Goal status: INITIAL  4.  Pt will be independent with HEP.  Baseline:  Goal status: MET 6/23    LONG TERM GOALS: Target date: 11/13/2023    Pt will increase knee ext strength by 15lbs for community ambulation. Baseline:  Goal status: INITIAL  2.  Pt will increase hip abd strength by 15lbs for community ambulation.  Baseline:  Goal status: INITIAL  3.  Pt will have 16 point difference in LEFS score for functional improvement. Baseline:  Goal status: INITIAL    PLAN:  PT FREQUENCY:  1x/week  PT DURATION: 6 weeks  PLANNED INTERVENTIONS: 97164- PT Re-evaluation, 97750- Physical Performance Testing, 97110-Therapeutic exercises, 97530- Therapeutic activity, [id number]-  Neuromuscular re-education, [phone]- Self Care, 02859- Manual therapy, [phone]- Gait training, [phone]- Aquatic Therapy, [phone]- Electrical stimulation (unattended), [phone]- Electrical stimulation (manual), and 97033- Ionotophoresis 4mg /ml Dexamethasone  PLAN FOR NEXT SESSION: Consider ionto, take pt through gym exercises, manual therapy Asberry Rodes, PTA  12/17/23 11:35 AM [facility] [address] [place], [place], [address] Phone: [phone]   Fax:  [phone]

## 2023-12-17 ENCOUNTER — Encounter (HOSPITAL_BASED_OUTPATIENT_CLINIC_OR_DEPARTMENT_OTHER): Payer: Self-pay | Admitting: Physical Therapy

## 2023-12-19 ENCOUNTER — Ambulatory Visit

## 2023-12-19 ENCOUNTER — Ambulatory Visit
Admission: RE | Admit: 2023-12-19 | Discharge: 2023-12-19 | Disposition: A | Discharge status: Home / Self Care | Source: Ambulatory Visit | Attending: Sports Medicine | Admitting: Sports Medicine

## 2023-12-19 DIAGNOSIS — J309 Allergic rhinitis, unspecified: Secondary | ICD-10-CM | POA: Diagnosis not present

## 2023-12-19 DIAGNOSIS — M25561 Pain in right knee: Secondary | ICD-10-CM

## 2023-12-22 ENCOUNTER — Ambulatory Visit (HOSPITAL_BASED_OUTPATIENT_CLINIC_OR_DEPARTMENT_OTHER): Admitting: Physical Therapy

## 2023-12-22 ENCOUNTER — Encounter (HOSPITAL_BASED_OUTPATIENT_CLINIC_OR_DEPARTMENT_OTHER): Payer: Self-pay | Admitting: Physical Therapy

## 2023-12-22 DIAGNOSIS — R2689 Other abnormalities of gait and mobility: Secondary | ICD-10-CM

## 2023-12-22 DIAGNOSIS — M25561 Pain in right knee: Secondary | ICD-10-CM

## 2023-12-22 DIAGNOSIS — M6281 Muscle weakness (generalized): Secondary | ICD-10-CM

## 2023-12-22 NOTE — Therapy (Signed)
 OUTPATIENT PHYSICAL THERAPY LOWER EXTREMITY TREATMENT   Patient Name: Cindy [name] MRN: [id number] DOB:[birth year], 53 y.o., female Today's Date: 12/22/2023  END OF SESSION:  PT End of Session - 12/22/23 1344     Visit Number 11    Number of Visits 16    Date for PT Re-Evaluation 01/27/24    PT Start Time 1019   therapy late   PT Stop Time 1058    PT Time Calculation (min) 39 min    Activity Tolerance Patient tolerated treatment well    Behavior During Therapy WFL for tasks assessed/performed                Past Medical History:  Diagnosis Date   Asthma    Hypertension    Urticaria    History reviewed. No pertinent surgical history. Patient Active Problem List   Diagnosis Date Noted   Seasonal and perennial allergic rhinitis 06/30/2019   Reactive airway disease 06/30/2019    PCP: Verena Mems, MD  REFERRING PROVIDER: Dasie Fitch, MD  REFERRING DIAG: L knee pain  THERAPY DIAG:  Pes anserine bursitis  Rationale for Evaluation and Treatment: Rehabilitation  ONSET DATE: end of february  SUBJECTIVE:   SUBJECTIVE STATEMENT:  The patient reports she had a little soreness over the past few days. Sh ewent to the gym and thinks the knee extension machine caused some of it.   Eval: Pt had acute onset of R knee pain in late February. Went to ED and was diagnosed with bursitis. Has difficulty walking and bending down. Feels pain the most when she is walking with 4/10 pain at worst. Knee has gradually been getting stiffer due to muscle tightness. Feels like her gait has been off since the onset of pain. Wants to get back to walking in her neighborhood and possibly work.   PERTINENT HISTORY: Asthma Hypertension Urticaria PAIN:  Are you having pain? no: NPRS scale: 0/10 Pain location: medial joint line, superior to patella Pain description:  Aggravating factors: walking, squatting Relieving factors: stretching  PRECAUTIONS: None  RED  FLAGS: None   WEIGHT BEARING RESTRICTIONS: No  FALLS:  Has patient fallen in last 6 months? No  LIVING ENVIRONMENT: 11-12 stairs  OCCUPATION: nurse  Hobbies:  Going on walks  PLOF: Independent  PATIENT GOALS: decreased pain, increased confidence while walking  NEXT MD VISIT: n/a  OBJECTIVE:  Note: Objective measures were completed at Evaluation unless otherwise noted.  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 No evidence of acute fracture or dislocation. No focal bone lesion or bone destruction. No significant effusions. Soft tissues are unremarkable.  PATIENT SURVEYS:  LEFS 45/80  7/7 LEFS 56/80  COGNITION: Overall cognitive status: Within functional limits for tasks assessed     SENSATION: WFL  EDEMA:    MUSCLE LENGTH:   POSTURE: No Significant postural limitations  PALPATION: TTP pes anserine and superior patella   LUMBAR ROM:   Active  A/PROM  eval  Flexion WFL  Extension WFL  Right lateral flexion WFL  Left lateral flexion WFL  Right rotation   Left rotation    (Blank rows = not tested)  LOWER EXTREMITY ROM:  Passive ROM Right eval Left eval  Hip flexion [facility]  Hip extension    Hip abduction [facility]  Hip adduction    Hip internal rotation WFL painful at end range [facility]  Hip external rotation  [facility] WFL  Knee flexion    Knee extension    Ankle dorsiflexion    Ankle plantarflexion    Ankle inversion    Ankle eversion     (Blank rows = not tested)  LOWER EXTREMITY MMT:  MMT Right eval Left eval Right  10/23/23 Right  7/7 Left 7/7  Hip flexion 22.9 27.1 34.1 37.1 37.6  Hip extension       Hip abduction 37.3 56.9 39.7 43.6  51.5  Hip adduction       Hip internal rotation       Hip external rotation       Knee flexion       Knee extension 34.5 57.8 46.9 41.9 49.1  Ankle dorsiflexion        Ankle plantarflexion       Ankle inversion       Ankle eversion        (Blank rows = not tested)  LOWER EXTREMITY SPECIAL TESTS:    FUNCTIONAL TESTS:     GAIT: Distance walked: 57ft Assistive device utilized: None Level of assistance: Complete Independence Comments: decreased stance time on R, shifting to left because of pain and weakness   Treatment Date: 7/28 Manual: trigger point release to medial gastroc and medial hamstring  Extension stretching with distraction  Review of self extension stretching.  Patella mobilization   There-ex: Leg press x12 80 lbs RPE of 3 X12 90 lbs RPE 3  X12 94 RPE of 4  There-act: Step up and drive 7k89 4inch  Lateral step up and drive 7k89 4 inch LE reach for downstairs training 2x10 2 inch     7/22  Manual: trigger point release to medial gastroc and medial hamstring  Extension stretching with distraction  Review of self extension stretching.  Patella mobilization   Neuro-re-ed:  Fig 4 bridge 2x15ea Step onto air-ex fwd and lateral 2x10    There-ex:  SLR 3x10   Leg press x12 50 lbs RPE of 2  X12 70 lbs RPE 3  X12 80 RPE of 3   Knee extension   3X12 10 lbs RPE of 3   Hip abduction 3x12 55 lbs       7/15 Upright bike L5 xmin PROm R knee STM to medial HS/adductors Patella mobilizations Prone hip extension with knee flexed 2x15ea S/l abduction with taps 2x15ea Fig 4 bridge 2x15ea Lunge step backs at rail x10ea  SLDL with reach to 6 step x10ea HS stretch with heel on 6 step 30sec x1ea Eccentric fwd step down 6 2x10ea  PATIENT EDUCATION:  Education details: HEP, symptom management, self care Person educated: Patient Education method: Explanation, Demonstration, Tactile cues, Verbal cues, and Handouts Education comprehension: verbalized understanding and returned demonstration  HOME EXERCISE PROGRAM: Access Code: [email or web address] URL: [email or web address] Date: 10/02/2023 Prepared by: Georgetta Moulds  Exercises - Seated Long Arc Quad  - 1 x daily - 7 x weekly - 3 sets - 10 reps - Seated Hip Abduction with Resistance  - 1 x daily - 7 x weekly - 3 sets - 10 reps - Seated Hamstring Stretch  - 1 x daily - 7 x weekly - 3 sets - 10 reps - Standing Gastroc Stretch at Counter  - 1 x daily - 7 x weekly - 3 sets - 10 reps - Standing Gastroc Stretch on Foam 1/2 Roll  - 1 x daily - 7 x weekly - 3 sets - 10 reps  ASSESSMENT:  CLINICAL IMPRESSION: The patient has  received her MRI. She will meet with her MD soon. She had minor pain after the last visit. We removed knee extensions and added in a closed chain stair series. She tolerated. well. She will continue to work on strength and stability.    Eval: Patient is a 53 y.o. female who was seen today for physical therapy evaluation and treatment for pes anserine bursitis. Symptoms were brought out with palpation and knee flex. Gait was assessed with decreased stance time on R leg and shifting away due to pain and weakness. MMT was noted with biggest differences in knee ext and hip abd. Pt was instructed on nature of condition and treatment strategies moving forward. Goal is to get back to walkig in the neighborhood and have more confidence in community ambulation. Therapeutic exercises were done with education on grading, progressions, stretching times, and symptom management. Next visit can progress exercises with showing gym alternatives.  Ionoto should also be considered. Pt tolerated all treatment strategies well today with no increase in symptoms. Pt will continue to benefit from skilled physical therapy to reduce irritability and increase functional strength for ADL's and recreational activities.   OBJECTIVE IMPAIRMENTS: Abnormal gait, decreased coordination, decreased endurance, decreased mobility, difficulty walking, decreased ROM, and decreased strength.   ACTIVITY LIMITATIONS: carrying, lifting, bending, squatting, and  toileting  PARTICIPATION LIMITATIONS: cleaning, laundry, shopping, community activity, and occupation  PERSONAL FACTORS: 1-2 comorbidities: asthma,  are also affecting patient's functional outcome. Occupation   REHAB POTENTIAL: Good  CLINICAL DECISION MAKING: Evolving/moderate complexity  EVALUATION COMPLEXITY: Moderate   GOALS: Goals reviewed with patient? Yes  SHORT TERM GOALS: Target date: 10/23/2023   Pt will increase knee ext strength by 5lbs for community ambulation. Baseline: Goal status: MET - 10/23/23  2.  Pt will increase hip abd strength by 5lbs for community ambulation.  Baseline:  Goal status: IN  PROGRESS -10/23/23  3.  Pt will have 8 point difference in LEFS score for functional improvement.  Baseline:  Goal status: INITIAL  4.  Pt will be independent with HEP.  Baseline:  Goal status: MET 6/23    LONG TERM GOALS: Target date: 11/13/2023    Pt will increase knee ext strength by 15lbs for community ambulation. Baseline:  Goal status: INITIAL  2.  Pt will increase hip abd strength by 15lbs for community ambulation.  Baseline:  Goal status: INITIAL  3.  Pt will have 16 point difference in LEFS score for functional improvement. Baseline:  Goal status: INITIAL    PLAN:  PT FREQUENCY: 1x/week  PT DURATION: 6 weeks  PLANNED INTERVENTIONS: 97164- PT Re-evaluation, 97750- Physical Performance Testing, 97110-Therapeutic exercises, 97530- Therapeutic activity, [id number]- Neuromuscular re-education, [phone]- Self Care, 02859- Manual therapy, [phone]- Gait training, [id number]- Aquatic Therapy, [phone]- Electrical stimulation (unattended), [phone]- Electrical stimulation (manual), and 97033- Ionotophoresis 4mg /ml Dexamethasone  PLAN FOR NEXT SESSION: Consider ionto, take pt through gym exercises, manual therapy Asberry Rodes, PTA  12/22/23 1:46 PM [facility] [address] [place], [place], [address] Phone:  [phone]   Fax:  [phone]

## 2023-12-23 ENCOUNTER — Encounter (HOSPITAL_BASED_OUTPATIENT_CLINIC_OR_DEPARTMENT_OTHER): Payer: Self-pay | Admitting: Physical Therapy

## 2023-12-30 ENCOUNTER — Encounter (HOSPITAL_BASED_OUTPATIENT_CLINIC_OR_DEPARTMENT_OTHER): Payer: Self-pay

## 2023-12-30 ENCOUNTER — Ambulatory Visit (HOSPITAL_BASED_OUTPATIENT_CLINIC_OR_DEPARTMENT_OTHER): Attending: Sports Medicine

## 2023-12-30 DIAGNOSIS — M25561 Pain in right knee: Secondary | ICD-10-CM | POA: Insufficient documentation

## 2023-12-30 DIAGNOSIS — R2689 Other abnormalities of gait and mobility: Secondary | ICD-10-CM | POA: Diagnosis present

## 2023-12-30 DIAGNOSIS — M6281 Muscle weakness (generalized): Secondary | ICD-10-CM | POA: Diagnosis present

## 2023-12-30 NOTE — Therapy (Signed)
 OUTPATIENT PHYSICAL THERAPY LOWER EXTREMITY TREATMENT   Patient Name: Cindy [name] MRN: [id number] DOB:1970-07-19, 53 y.o., female Today's Date: 12/30/2023  END OF SESSION:  PT End of Session - 12/30/23 1027     Visit Number 12    Number of Visits 16    Date for PT Re-Evaluation 01/27/24    PT Start Time 1016    PT Stop Time 1100    PT Time Calculation (min) 44 min    Activity Tolerance Patient tolerated treatment well    Behavior During Therapy WFL for tasks assessed/performed                 Past Medical History:  Diagnosis Date   Asthma    Hypertension    Urticaria    History reviewed. No pertinent surgical history. Patient Active Problem List   Diagnosis Date Noted   Seasonal and perennial allergic rhinitis 06/30/2019   Reactive airway disease 06/30/2019    PCP: Verena Mems, MD  REFERRING PROVIDER: Dasie Fitch, MD  REFERRING DIAG: L knee pain  THERAPY DIAG:  Pes anserine bursitis  Rationale for Evaluation and Treatment: Rehabilitation  ONSET DATE: end of february  SUBJECTIVE:   SUBJECTIVE STATEMENT:  Saw ortho surgeon last week (Dr. Edna) who stated he was against repairing the meniscus tear. Suggeted partial knee replacement later down the line and wants her to continue with PT. No pain at entry.   Eval: Pt had acute onset of R knee pain in late February. Went to ED and was diagnosed with bursitis. Has difficulty walking and bending down. Feels pain the most when she is walking with 4/10 pain at worst. Knee has gradually been getting stiffer due to muscle tightness. Feels like her gait has been off since the onset of pain. Wants to get back to walking in her neighborhood and possibly work.   PERTINENT HISTORY: Asthma Hypertension Urticaria PAIN:  Are you having pain? no: NPRS scale: 0/10 Pain location: medial joint line, superior to patella Pain description:  Aggravating factors: walking, squatting Relieving factors:  stretching  PRECAUTIONS: None  RED FLAGS: None   WEIGHT BEARING RESTRICTIONS: No  FALLS:  Has patient fallen in last 6 months? No  LIVING ENVIRONMENT: 11-12 stairs  OCCUPATION: nurse  Hobbies:  Going on walks  PLOF: Independent  PATIENT GOALS: decreased pain, increased confidence while walking  NEXT MD VISIT: n/a  OBJECTIVE:  Note: Objective measures were completed at Evaluation unless otherwise noted.  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 No evidence of acute fracture or dislocation. No focal bone lesion or bone destruction. No significant effusions. Soft tissues are unremarkable.  PATIENT SURVEYS:  LEFS 45/80  7/7 LEFS 56/80  COGNITION: Overall cognitive status: Within functional limits for tasks assessed     SENSATION: WFL  EDEMA:    MUSCLE LENGTH:   POSTURE: No Significant postural limitations  PALPATION: TTP pes anserine and superior patella   LUMBAR ROM:   Active  A/PROM  eval  Flexion WFL  Extension WFL  Right lateral flexion WFL  Left lateral flexion WFL  Right rotation   Left rotation    (Blank rows = not tested)  LOWER EXTREMITY ROM:  Passive ROM Right eval Left eval  Hip flexion [facility]  Hip extension    Hip abduction [facility]  Hip adduction    Hip internal rotation WFL painful at end range  WFL  Hip external rotation [facility]  Knee flexion    Knee extension    Ankle dorsiflexion    Ankle plantarflexion    Ankle inversion    Ankle eversion     (Blank rows = not tested)  LOWER EXTREMITY MMT:  MMT Right eval Left eval Right  10/23/23 Right  7/7 Left 7/7  Hip flexion 22.9 27.1 34.1 37.1 37.6  Hip extension       Hip abduction 37.3 56.9 39.7 43.6  51.5  Hip adduction       Hip internal rotation       Hip external rotation       Knee flexion       Knee extension 34.5 57.8  46.9 41.9 49.1  Ankle dorsiflexion       Ankle plantarflexion       Ankle inversion       Ankle eversion        (Blank rows = not tested)  LOWER EXTREMITY SPECIAL TESTS:    FUNCTIONAL TESTS:     GAIT: Distance walked: 9ft Assistive device utilized: None Level of assistance: Complete Independence Comments: decreased stance time on R, shifting to left because of pain and weakness   Treatment Date: 8/5 Manual: trigger point release to medial gastroc and medial hamstring, posterior knee Patella mobilization   There-ex: Long sit HSS  SLR with ER 3x10 Sidestepping in squat position with RTB at ankles- at railing.    There-act: Step up and drive 7k89 4inch  Lateral step up and drive 7k89 4 inch LE reach for downstairs training 2x10 2 inch  Lunges x10ea SLDL 3x10 3 way slides in squat position x10ea   7/28 Manual: trigger point release to medial gastroc and medial hamstring  Extension stretching with distraction  Review of self extension stretching.  Patella mobilization   There-ex: Leg press x12 80 lbs RPE of 3 X12 90 lbs RPE 3  X12 94 RPE of 4  There-act: Step up and drive 7k89 4inch  Lateral step up and drive 7k89 4 inch LE reach for downstairs training 2x10 2 inch     7/22  Manual: trigger point release to medial gastroc and medial hamstring  Extension stretching with distraction  Review of self extension stretching.  Patella mobilization   Neuro-re-ed:  Fig 4 bridge 2x15ea Step onto air-ex fwd and lateral 2x10    There-ex:  SLR 3x10   Leg press x12 50 lbs RPE of 2  X12 70 lbs RPE 3  X12 80 RPE of 3   Knee extension   3X12 10 lbs RPE of 3   Hip abduction 3x12 55 lbs       7/15 Upright bike L5 xmin PROm R knee STM to medial HS/adductors Patella mobilizations Prone hip extension with knee flexed 2x15ea S/l abduction with taps 2x15ea Fig 4 bridge 2x15ea Lunge step backs at rail x10ea  SLDL with reach to 6 step x10ea HS stretch  with heel on 6 step 30sec x1ea Eccentric fwd step down 6 2x10ea  PATIENT EDUCATION:  Education details: HEP, symptom management, self care Person educated: Patient Education method: Explanation, Demonstration, Tactile cues, Verbal cues, and Handouts Education comprehension: verbalized understanding and returned demonstration  HOME EXERCISE PROGRAM: Access Code: [email or web address] URL: [email or web address] Date: 10/02/2023 Prepared by: Georgetta Moulds  Exercises - Seated Long Arc Quad  - 1 x daily - 7 x weekly - 3 sets - 10 reps - Seated Hip Abduction with Resistance  - 1 x  daily - 7 x weekly - 3 sets - 10 reps - Seated Hamstring Stretch  - 1 x daily - 7 x weekly - 3 sets - 10 reps - Standing Gastroc Stretch at Counter  - 1 x daily - 7 x weekly - 3 sets - 10 reps - Standing Gastroc Stretch on Foam 1/2 Roll  - 1 x daily - 7 x weekly - 3 sets - 10 reps  ASSESSMENT:  CLINICAL IMPRESSION: Good tolerance for therex and therAct. She felt fatigue in bil glutes with resisted squat side stepping and squat 3 way slides. Continued to work on STM to posterior knee and HS due to ongoing tension here, though she does report decreased tenderness compared to previous sessions. She denied increase in pain at end of session. Will continue to progress with strengthening as tolerated.    Eval: Patient is a 53 y.o. female who was seen today for physical therapy evaluation and treatment for pes anserine bursitis. Symptoms were brought out with palpation and knee flex. Gait was assessed with decreased stance time on R leg and shifting away due to pain and weakness. MMT was noted with biggest differences in knee ext and hip abd. Pt was instructed on nature of condition and treatment strategies moving forward. Goal is to get back to walkig in the neighborhood and have more confidence in community ambulation. Therapeutic exercises were done with education on grading, progressions, stretching times, and symptom  management. Next visit can progress exercises with showing gym alternatives.  Ionoto should also be considered. Pt tolerated all treatment strategies well today with no increase in symptoms. Pt will continue to benefit from skilled physical therapy to reduce irritability and increase functional strength for ADL's and recreational activities.   OBJECTIVE IMPAIRMENTS: Abnormal gait, decreased coordination, decreased endurance, decreased mobility, difficulty walking, decreased ROM, and decreased strength.   ACTIVITY LIMITATIONS: carrying, lifting, bending, squatting, and toileting  PARTICIPATION LIMITATIONS: cleaning, laundry, shopping, community activity, and occupation  PERSONAL FACTORS: 1-2 comorbidities: asthma,  are also affecting patient's functional outcome. Occupation   REHAB POTENTIAL: Good  CLINICAL DECISION MAKING: Evolving/moderate complexity  EVALUATION COMPLEXITY: Moderate   GOALS: Goals reviewed with patient? Yes  SHORT TERM GOALS: Target date: 10/23/2023   Pt will increase knee ext strength by 5lbs for community ambulation. Baseline: Goal status: MET - 10/23/23  2.  Pt will increase hip abd strength by 5lbs for community ambulation.  Baseline:  Goal status: IN  PROGRESS -10/23/23  3.  Pt will have 8 point difference in LEFS score for functional improvement.  Baseline:  Goal status: INITIAL  4.  Pt will be independent with HEP.  Baseline:  Goal status: MET 6/23    LONG TERM GOALS: Target date: 11/13/2023    Pt will increase knee ext strength by 15lbs for community ambulation. Baseline:  Goal status: INITIAL  2.  Pt will increase hip abd strength by 15lbs for community ambulation.  Baseline:  Goal status: INITIAL  3.  Pt will have 16 point difference in LEFS score for functional improvement. Baseline:  Goal status: INITIAL    PLAN:  PT FREQUENCY: 1x/week  PT DURATION: 6 weeks  PLANNED INTERVENTIONS: 97164- PT Re-evaluation, 97750- Physical  Performance Testing, 97110-Therapeutic exercises, 97530- Therapeutic activity, [id number]- Neuromuscular re-education, 97535- Self Care, 02859- Manual therapy, [id number]- Gait training, [phone]- Aquatic Therapy, [phone]- Electrical stimulation (unattended), [id number]- Electrical stimulation (manual), and 97033- Ionotophoresis 4mg /ml Dexamethasone  PLAN FOR NEXT SESSION: Consider ionto, take pt through gym exercises, manual therapy  Asberry Rodes, PTA  12/30/23 1:13 PM [facility] [address] [place], [place], [address] Phone: [phone]   Fax:  [phone]

## 2024-01-06 ENCOUNTER — Ambulatory Visit (HOSPITAL_BASED_OUTPATIENT_CLINIC_OR_DEPARTMENT_OTHER): Admitting: Physical Therapy

## 2024-01-06 DIAGNOSIS — M6281 Muscle weakness (generalized): Secondary | ICD-10-CM

## 2024-01-06 DIAGNOSIS — M25561 Pain in right knee: Secondary | ICD-10-CM

## 2024-01-06 DIAGNOSIS — R2689 Other abnormalities of gait and mobility: Secondary | ICD-10-CM

## 2024-01-06 NOTE — Therapy (Signed)
 OUTPATIENT PHYSICAL THERAPY LOWER EXTREMITY TREATMENT   Patient Name: Cindy [name] MRN: [id number] DOB:09/28/1970, 53 y.o., female Today's Date: 01/07/2024  END OF SESSION:  PT End of Session - 01/07/24 0925     Visit Number 13    Number of Visits 16    Date for PT Re-Evaluation 01/27/24    PT Start Time 1015    PT Stop Time 1058    PT Time Calculation (min) 43 min    Activity Tolerance Patient tolerated treatment well    Behavior During Therapy [facility] for tasks assessed/performed                  Past Medical History:  Diagnosis Date   Asthma    Hypertension    Urticaria    History reviewed. No pertinent surgical history. Patient Active Problem List   Diagnosis Date Noted   Seasonal and perennial allergic rhinitis 06/30/2019   Reactive airway disease 06/30/2019    PCP: Verena Mems, MD  REFERRING PROVIDER: Dasie Fitch, MD  REFERRING DIAG:  R knee pain  THERAPY DIAG:  Pes anserine bursitis  Rationale for Evaluation and Treatment: Rehabilitation  ONSET DATE: end of february  SUBJECTIVE:   SUBJECTIVE STATEMENT: The patient reports she has been doing pretty good. She just feels it sometimes.   Eval: Pt had acute onset of R knee pain in late February. Went to ED and was diagnosed with bursitis. Has difficulty walking and bending down. Feels pain the most when she is walking with 4/10 pain at worst. Knee has gradually been getting stiffer due to muscle tightness. Feels like her gait has been off since the onset of pain. Wants to get back to walking in her neighborhood and possibly work.   PERTINENT HISTORY: Asthma Hypertension Urticaria PAIN:  Are you having pain? no: NPRS scale: 0/10 Pain location: medial joint line, superior to patella Pain description:  Aggravating factors: walking, squatting Relieving factors: stretching  PRECAUTIONS: None  RED FLAGS: None   WEIGHT BEARING RESTRICTIONS: No  FALLS:  Has patient fallen in last 6  months? No  LIVING ENVIRONMENT: 11-12 stairs  OCCUPATION: nurse  Hobbies:  Going on walks  PLOF: Independent  PATIENT GOALS: decreased pain, increased confidence while walking  NEXT MD VISIT: n/a  OBJECTIVE:  Note: Objective measures were completed at Evaluation unless otherwise noted.  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 No evidence of acute fracture or dislocation. No focal bone lesion or bone destruction. No significant effusions. Soft tissues are unremarkable.  PATIENT SURVEYS:  LEFS 45/80  7/7 LEFS 56/80  COGNITION: Overall cognitive status: Within functional limits for tasks assessed     SENSATION: WFL  EDEMA:    MUSCLE LENGTH:   POSTURE: No Significant postural limitations  PALPATION: TTP pes anserine and superior patella   LUMBAR ROM:   Active  A/PROM  eval  Flexion WFL  Extension WFL  Right lateral flexion WFL  Left lateral flexion WFL  Right rotation   Left rotation    (Blank rows = not tested)  LOWER EXTREMITY ROM:  Passive ROM Right eval Left eval  Hip flexion [facility]  Hip extension    Hip abduction [facility]  Hip adduction    Hip internal rotation WFL painful at end range [facility]  Hip external rotation [facility]  Knee flexion    Knee extension    Ankle dorsiflexion  Ankle plantarflexion    Ankle inversion    Ankle eversion     (Blank rows = not tested)  LOWER EXTREMITY MMT:  MMT Right eval Left eval Right  10/23/23 Right  7/7 Left 7/7  Hip flexion 22.9 27.1 34.1 37.1 37.6  Hip extension       Hip abduction 37.3 56.9 39.7 43.6  51.5  Hip adduction       Hip internal rotation       Hip external rotation       Knee flexion       Knee extension 34.5 57.8 46.9 41.9 49.1  Ankle dorsiflexion       Ankle plantarflexion       Ankle inversion       Ankle eversion        (Blank rows =  not tested)  LOWER EXTREMITY SPECIAL TESTS:    FUNCTIONAL TESTS:     GAIT: Distance walked: 66ft Assistive device utilized: None Level of assistance: Complete Independence Comments: decreased stance time on R, shifting to left because of pain and weakness   Treatment Date: 8/12 Manual: trigger point release to medial gastroc and medial hamstring, posterior knee Patella mobilization   Therapeutic activity: Step up 4 inch 3 x 10 Lateral step up 4 inch 3 x 10 Step down 2 inch 3 x 10 Showed patient where her step was in the gym  Neuromuscular reeducation: Cable walk 20 pounds x 10 Step onto and off Airex forward and lateral 2 x 10 each leg  There ex neck: Leg press Cybex 70 pounds 3 x 1 12 RPE of 4  8/5 Manual: trigger point release to medial gastroc and medial hamstring, posterior knee Patella mobilization   There-ex: Long sit HSS  SLR with ER 3x10 Sidestepping in squat position with RTB at ankles- at railing.    There-act: Step up and drive 7k89 4inch  Lateral step up and drive 7k89 4 inch LE reach for downstairs training 2x10 2 inch  Lunges x10ea SLDL 3x10 3 way slides in squat position x10ea   7/28 Manual: trigger point release to medial gastroc and medial hamstring  Extension stretching with distraction  Review of self extension stretching.  Patella mobilization   There-ex: Leg press x12 80 lbs RPE of 3 X12 90 lbs RPE 3  X12 94 RPE of 4  There-act: Step up and drive 7k89 4inch  Lateral step up and drive 7k89 4 inch LE reach for downstairs training 2x10 2 inch     7/22  Manual: trigger point release to medial gastroc and medial hamstring  Extension stretching with distraction  Review of self extension stretching.  Patella mobilization   Neuro-re-ed:  Fig 4 bridge 2x15ea Step onto air-ex fwd and lateral 2x10    There-ex:  SLR 3x10   Leg press x12 50 lbs RPE of 2  X12 70 lbs RPE 3  X12 80 RPE of 3   Knee extension   3X12 10 lbs  RPE of 3   Hip abduction 3x12 55 lbs       7/15 Upright bike L5 xmin PROm R knee STM to medial HS/adductors Patella mobilizations Prone hip extension with knee flexed 2x15ea S/l abduction with taps 2x15ea Fig 4 bridge 2x15ea Lunge step backs at rail x10ea  SLDL with reach to 6 step x10ea HS stretch with heel on 6 step 30sec x1ea Eccentric fwd step down 6 2x10ea  PATIENT EDUCATION:  Education details: HEP, symptom management, self care Person  educated: Patient Education method: Explanation, Demonstration, Tactile cues, Verbal cues, and Handouts Education comprehension: verbalized understanding and returned demonstration  HOME EXERCISE PROGRAM: Access Code: [email or web address] URL: [email or web address] Date: 10/02/2023 Prepared by: Georgetta Moulds  Exercises - Seated Long Arc Quad  - 1 x daily - 7 x weekly - 3 sets - 10 reps - Seated Hip Abduction with Resistance  - 1 x daily - 7 x weekly - 3 sets - 10 reps - Seated Hamstring Stretch  - 1 x daily - 7 x weekly - 3 sets - 10 reps - Standing Gastroc Stretch at Counter  - 1 x daily - 7 x weekly - 3 sets - 10 reps - Standing Gastroc Stretch on Foam 1/2 Roll  - 1 x daily - 7 x weekly - 3 sets - 10 reps  ASSESSMENT:  CLINICAL IMPRESSION: Patient continues to progress well.  We worked on control exercises.  She tolerated well.  She had no significant increase in pain.  She still has mild tenderness to palpation in the back of her knee but that is significantly improved.  Her knee extension has improved.  She is advised to continue strengthening.  If she does well over the next week she may discharge next visit.  Eval: Patient is a 53 y.o. female who was seen today for physical therapy evaluation and treatment for pes anserine bursitis. Symptoms were brought out with palpation and knee flex. Gait was assessed with decreased stance time on R leg and shifting away due to pain and weakness. MMT was noted with biggest differences in  knee ext and hip abd. Pt was instructed on nature of condition and treatment strategies moving forward. Goal is to get back to walkig in the neighborhood and have more confidence in community ambulation. Therapeutic exercises were done with education on grading, progressions, stretching times, and symptom management. Next visit can progress exercises with showing gym alternatives.  Ionoto should also be considered. Pt tolerated all treatment strategies well today with no increase in symptoms. Pt will continue to benefit from skilled physical therapy to reduce irritability and increase functional strength for ADL's and recreational activities.   OBJECTIVE IMPAIRMENTS: Abnormal gait, decreased coordination, decreased endurance, decreased mobility, difficulty walking, decreased ROM, and decreased strength.   ACTIVITY LIMITATIONS: carrying, lifting, bending, squatting, and toileting  PARTICIPATION LIMITATIONS: cleaning, laundry, shopping, community activity, and occupation  PERSONAL FACTORS: 1-2 comorbidities: asthma,  are also affecting patient's functional outcome. Occupation   REHAB POTENTIAL: Good  CLINICAL DECISION MAKING: Evolving/moderate complexity  EVALUATION COMPLEXITY: Moderate   GOALS: Goals reviewed with patient? Yes  SHORT TERM GOALS: Target date: 10/23/2023   Pt will increase knee ext strength by 5lbs for community ambulation. Baseline: Goal status: MET - 10/23/23  2.  Pt will increase hip abd strength by 5lbs for community ambulation.  Baseline:  Goal status: IN  PROGRESS -10/23/23  3.  Pt will have 8 point difference in LEFS score for functional improvement.  Baseline:  Goal status: INITIAL  4.  Pt will be independent with HEP.  Baseline:  Goal status: MET 6/23    LONG TERM GOALS: Target date: 11/13/2023    Pt will increase knee ext strength by 15lbs for community ambulation. Baseline:  Goal status: INITIAL  2.  Pt will increase hip abd strength by 15lbs for  community ambulation.  Baseline:  Goal status: INITIAL  3.  Pt will have 16 point difference in LEFS score for functional improvement. Baseline:  Goal status: INITIAL  PLAN:  PT FREQUENCY: 1x/week  PT DURATION: 6 weeks  PLANNED INTERVENTIONS: 97164- PT Re-evaluation, 97750- Physical Performance Testing, 97110-Therapeutic exercises, 97530- Therapeutic activity, [id number]- Neuromuscular re-education, [phone]- Self Care, 02859- Manual therapy, [phone]- Gait training, [phone]- Aquatic Therapy, [phone]- Electrical stimulation (unattended), [phone]- Electrical stimulation (manual), and [id number]- Ionotophoresis 4mg /ml Dexamethasone  PLAN FOR NEXT SESSION: Consider ionto, take pt through gym exercises, manual therapy  Alm Don, PT, DPT  01/07/24 9:28 AM [facility] [address] [place], [place], [address] Phone: [phone]   Fax:  [phone]

## 2024-01-07 ENCOUNTER — Encounter (HOSPITAL_BASED_OUTPATIENT_CLINIC_OR_DEPARTMENT_OTHER): Payer: Self-pay | Admitting: Physical Therapy

## 2024-01-13 ENCOUNTER — Ambulatory Visit (HOSPITAL_BASED_OUTPATIENT_CLINIC_OR_DEPARTMENT_OTHER)

## 2024-01-13 ENCOUNTER — Encounter (HOSPITAL_BASED_OUTPATIENT_CLINIC_OR_DEPARTMENT_OTHER): Payer: Self-pay

## 2024-01-13 DIAGNOSIS — R2689 Other abnormalities of gait and mobility: Secondary | ICD-10-CM

## 2024-01-13 DIAGNOSIS — M6281 Muscle weakness (generalized): Secondary | ICD-10-CM

## 2024-01-13 DIAGNOSIS — M25561 Pain in right knee: Secondary | ICD-10-CM | POA: Diagnosis not present

## 2024-01-13 NOTE — Therapy (Signed)
 OUTPATIENT PHYSICAL THERAPY LOWER EXTREMITY TREATMENT   Patient Name: Cindy [name] MRN: [id number] DOB:[birth year], 53 y.o., female Today's Date: 01/13/2024  END OF SESSION:  PT End of Session - 01/13/24 1032     Visit Number 14    Number of Visits 16    Date for PT Re-Evaluation 01/27/24    PT Start Time 1015    PT Stop Time 1100    PT Time Calculation (min) 45 min    Activity Tolerance Patient tolerated treatment well    Behavior During Therapy WFL for tasks assessed/performed                   Past Medical History:  Diagnosis Date   Asthma    Hypertension    Urticaria    History reviewed. No pertinent surgical history. Patient Active Problem List   Diagnosis Date Noted   Seasonal and perennial allergic rhinitis 06/30/2019   Reactive airway disease 06/30/2019    PCP: Verena Mems, MD  REFERRING PROVIDER: Dasie Fitch, MD  REFERRING DIAG:  R knee pain  THERAPY DIAG:  Pes anserine bursitis  Rationale for Evaluation and Treatment: Rehabilitation  ONSET DATE: end of february  SUBJECTIVE:   SUBJECTIVE STATEMENT: The patient reports stiffness in the knee each morning, but no pain.   Eval: Pt had acute onset of R knee pain in late February. Went to ED and was diagnosed with bursitis. Has difficulty walking and bending down. Feels pain the most when she is walking with 4/10 pain at worst. Knee has gradually been getting stiffer due to muscle tightness. Feels like her gait has been off since the onset of pain. Wants to get back to walking in her neighborhood and possibly work.   PERTINENT HISTORY: Asthma Hypertension Urticaria PAIN:  Are you having pain? no: NPRS scale: 0/10 Pain location: medial joint line, superior to patella Pain description:  Aggravating factors: walking, squatting Relieving factors: stretching  PRECAUTIONS: None  RED FLAGS: None   WEIGHT BEARING RESTRICTIONS: No  FALLS:  Has patient fallen in last 6 months?  No  LIVING ENVIRONMENT: 11-12 stairs  OCCUPATION: nurse  Hobbies:  Going on walks  PLOF: Independent  PATIENT GOALS: decreased pain, increased confidence while walking  NEXT MD VISIT: n/a  OBJECTIVE:  Note: Objective measures were completed at Evaluation unless otherwise noted.  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 No evidence of acute fracture or dislocation. No focal bone lesion or bone destruction. No significant effusions. Soft tissues are unremarkable.  PATIENT SURVEYS:  LEFS 45/80  7/7 LEFS 56/80  COGNITION: Overall cognitive status: Within functional limits for tasks assessed     SENSATION: WFL  EDEMA:    MUSCLE LENGTH:   POSTURE: No Significant postural limitations  PALPATION: TTP pes anserine and superior patella   LUMBAR ROM:   Active  A/PROM  eval  Flexion WFL  Extension WFL  Right lateral flexion WFL  Left lateral flexion WFL  Right rotation   Left rotation    (Blank rows = not tested)  LOWER EXTREMITY ROM:  Passive ROM Right eval Left eval  Hip flexion [facility]  Hip extension    Hip abduction [facility]  Hip adduction    Hip internal rotation WFL painful at end range [facility]  Hip external rotation [facility]  Knee flexion    Knee extension    Ankle dorsiflexion  Ankle plantarflexion    Ankle inversion    Ankle eversion     (Blank rows = not tested)  LOWER EXTREMITY MMT:  MMT Right eval Left eval Right  10/23/23 Right  7/7 Left 7/7  Hip flexion 22.9 27.1 34.1 37.1 37.6  Hip extension       Hip abduction 37.3 56.9 39.7 43.6  51.5  Hip adduction       Hip internal rotation       Hip external rotation       Knee flexion       Knee extension 34.5 57.8 46.9 41.9 49.1  Ankle dorsiflexion       Ankle plantarflexion       Ankle inversion       Ankle eversion        (Blank rows = not  tested)  LOWER EXTREMITY SPECIAL TESTS:    FUNCTIONAL TESTS:     GAIT: Distance walked: 69ft Assistive device utilized: None Level of assistance: Complete Independence Comments: decreased stance time on R, shifting to left because of pain and weakness   Treatment Date:   8/19 There ex: Leg press Cybex 100 pounds 3 x 12  Upright bike L5 Single leg bridges x20 SLR with ER 1# 3x10 S/l hip abduction 1# 3x10  TherAct: 4inch lateral step down 2x10 (cues for glute engagement) 4 fwd step down 2x10   Manual: trigger point release to medial gastroc and medial hamstring, posterior knee Patella mobilization   Neuro re-ed: SLS on airex 3x30sec SL hip hinge 2x10  8/12 Manual: trigger point release to medial gastroc and medial hamstring, posterior knee Patella mobilization   Therapeutic activity: Step up 4 inch 3 x 10 Lateral step up 4 inch 3 x 10 Step down 2 inch 3 x 10 Showed patient where her step was in the gym  Neuromuscular reeducation: Cable walk 20 pounds x 10 Step onto and off Airex forward and lateral 2 x 10 each leg  There ex neck: Leg press Cybex 70 pounds 3 x 1 12 RPE of 4  8/5 Manual: trigger point release to medial gastroc and medial hamstring, posterior knee Patella mobilization   There-ex: Long sit HSS  SLR with ER 3x10 Sidestepping in squat position with RTB at ankles- at railing.    There-act: Step up and drive 7k89 4inch  Lateral step up and drive 7k89 4 inch LE reach for downstairs training 2x10 2 inch  Lunges x10ea SLDL 3x10 3 way slides in squat position x10ea   7/28 Manual: trigger point release to medial gastroc and medial hamstring  Extension stretching with distraction  Review of self extension stretching.  Patella mobilization   There-ex: Leg press x12 80 lbs RPE of 3 X12 90 lbs RPE 3  X12 94 RPE of 4  There-act: Step up and drive 7k89 4inch  Lateral step up and drive 7k89 4 inch LE reach for downstairs  training 2x10 2 inch     7/22  Manual: trigger point release to medial gastroc and medial hamstring  Extension stretching with distraction  Review of self extension stretching.  Patella mobilization   Neuro-re-ed:  Fig 4 bridge 2x15ea Step onto air-ex fwd and lateral 2x10    There-ex:  SLR 3x10   Leg press x12 50 lbs RPE of 2  X12 70 lbs RPE 3  X12 80 RPE of 3   Knee extension   3X12 10 lbs RPE of 3   Hip abduction 3x12 55 lbs  7/15 Upright bike L5 xmin PROm R knee STM to medial HS/adductors Patella mobilizations Prone hip extension with knee flexed 2x15ea S/l abduction with taps 2x15ea Fig 4 bridge 2x15ea Lunge step backs at rail x10ea  SLDL with reach to 6 step x10ea HS stretch with heel on 6 step 30sec x1ea Eccentric fwd step down 6 2x10ea  PATIENT EDUCATION:  Education details: HEP, symptom management, self care Person educated: Patient Education method: Explanation, Demonstration, Tactile cues, Verbal cues, and Handouts Education comprehension: verbalized understanding and returned demonstration  HOME EXERCISE PROGRAM: Access Code: [email or web address] URL: [email or web address] Date: 10/02/2023 Prepared by: Georgetta Moulds  Exercises - Seated Long Arc Quad  - 1 x daily - 7 x weekly - 3 sets - 10 reps - Seated Hip Abduction with Resistance  - 1 x daily - 7 x weekly - 3 sets - 10 reps - Seated Hamstring Stretch  - 1 x daily - 7 x weekly - 3 sets - 10 reps - Standing Gastroc Stretch at Counter  - 1 x daily - 7 x weekly - 3 sets - 10 reps - Standing Gastroc Stretch on Foam 1/2 Roll  - 1 x daily - 7 x weekly - 3 sets - 10 reps  ASSESSMENT:  CLINICAL IMPRESSION: Good tolerance for progressions to strengthening program. Cued pt for glute engagement with eccentric step downs. No c/o pain with exercises. Mildly challenged by SLS on compliant surface. Will plan for HEP update and d/c next visit.   Eval: Patient is a 53 y.o. female who was seen  today for physical therapy evaluation and treatment for pes anserine bursitis. Symptoms were brought out with palpation and knee flex. Gait was assessed with decreased stance time on R leg and shifting away due to pain and weakness. MMT was noted with biggest differences in knee ext and hip abd. Pt was instructed on nature of condition and treatment strategies moving forward. Goal is to get back to walkig in the neighborhood and have more confidence in community ambulation. Therapeutic exercises were done with education on grading, progressions, stretching times, and symptom management. Next visit can progress exercises with showing gym alternatives.  Ionoto should also be considered. Pt tolerated all treatment strategies well today with no increase in symptoms. Pt will continue to benefit from skilled physical therapy to reduce irritability and increase functional strength for ADL's and recreational activities.   OBJECTIVE IMPAIRMENTS: Abnormal gait, decreased coordination, decreased endurance, decreased mobility, difficulty walking, decreased ROM, and decreased strength.   ACTIVITY LIMITATIONS: carrying, lifting, bending, squatting, and toileting  PARTICIPATION LIMITATIONS: cleaning, laundry, shopping, community activity, and occupation  PERSONAL FACTORS: 1-2 comorbidities: asthma,  are also affecting patient's functional outcome. Occupation   REHAB POTENTIAL: Good  CLINICAL DECISION MAKING: Evolving/moderate complexity  EVALUATION COMPLEXITY: Moderate   GOALS: Goals reviewed with patient? Yes  SHORT TERM GOALS: Target date: 10/23/2023   Pt will increase knee ext strength by 5lbs for community ambulation. Baseline: Goal status: MET - 10/23/23  2.  Pt will increase hip abd strength by 5lbs for community ambulation.  Baseline:  Goal status: IN  PROGRESS -10/23/23  3.  Pt will have 8 point difference in LEFS score for functional improvement.  Baseline:  Goal status: INITIAL  4.  Pt  will be independent with HEP.  Baseline:  Goal status: MET 6/23    LONG TERM GOALS: Target date: 11/13/2023    Pt will increase knee ext strength by 15lbs for community ambulation. Baseline:  Goal status: INITIAL  2.  Pt will increase hip abd strength by 15lbs for community ambulation.  Baseline:  Goal status: INITIAL  3.  Pt will have 16 point difference in LEFS score for functional improvement. Baseline:  Goal status: INITIAL    PLAN:  PT FREQUENCY: 1x/week  PT DURATION: 6 weeks  PLANNED INTERVENTIONS: 97164- PT Re-evaluation, 97750- Physical Performance Testing, 97110-Therapeutic exercises, 97530- Therapeutic activity, [id number]- Neuromuscular re-education, [phone]- Self Care, 02859- Manual therapy, [phone]- Gait training, [phone]- Aquatic Therapy, [phone]- Electrical stimulation (unattended), [phone]- Electrical stimulation (manual), and 97033- Ionotophoresis 4mg /ml Dexamethasone  PLAN FOR NEXT SESSION: Consider ionto, take pt through gym exercises, manual therapy  Asberry Rodes, PTA   01/13/24 1:14 PM [facility] [address] [place], [place], [address] Phone: [phone]   Fax:  [phone]

## 2024-01-21 ENCOUNTER — Ambulatory Visit (HOSPITAL_BASED_OUTPATIENT_CLINIC_OR_DEPARTMENT_OTHER)

## 2024-01-21 ENCOUNTER — Encounter (HOSPITAL_BASED_OUTPATIENT_CLINIC_OR_DEPARTMENT_OTHER): Payer: Self-pay

## 2024-01-21 ENCOUNTER — Other Ambulatory Visit (HOSPITAL_BASED_OUTPATIENT_CLINIC_OR_DEPARTMENT_OTHER): Payer: Self-pay

## 2024-01-21 ENCOUNTER — Ambulatory Visit (HOSPITAL_BASED_OUTPATIENT_CLINIC_OR_DEPARTMENT_OTHER): Admitting: Orthopaedic Surgery

## 2024-01-21 DIAGNOSIS — M25561 Pain in right knee: Secondary | ICD-10-CM | POA: Diagnosis not present

## 2024-01-21 DIAGNOSIS — R2689 Other abnormalities of gait and mobility: Secondary | ICD-10-CM

## 2024-01-21 DIAGNOSIS — S83241A Other tear of medial meniscus, current injury, right knee, initial encounter: Secondary | ICD-10-CM | POA: Diagnosis not present

## 2024-01-21 DIAGNOSIS — M6281 Muscle weakness (generalized): Secondary | ICD-10-CM

## 2024-01-21 MED ORDER — ASPIRIN 325 MG PO TBEC
325.0000 mg | DELAYED_RELEASE_TABLET | Freq: Every day | ORAL | 0 refills | Status: AC
Start: 2024-01-21 — End: ?
  Filled 2024-01-21: qty 14, 14d supply, fill #0

## 2024-01-21 MED ORDER — IBUPROFEN 800 MG PO TABS
800.0000 mg | ORAL_TABLET | Freq: Three times a day (TID) | ORAL | 0 refills | Status: AC
  Filled 2024-01-21: qty 30, 10d supply, fill #0

## 2024-01-21 MED ORDER — ACETAMINOPHEN 500 MG PO TABS
500.0000 mg | ORAL_TABLET | Freq: Three times a day (TID) | ORAL | 0 refills | Status: AC
  Filled 2024-01-21: qty 30, 10d supply, fill #0

## 2024-01-21 MED ORDER — OXYCODONE HCL 5 MG PO TABS
5.0000 mg | ORAL_TABLET | ORAL | 0 refills | Status: AC | PRN
  Filled 2024-01-21: qty 10, 2d supply, fill #0

## 2024-01-21 NOTE — Therapy (Signed)
 OUTPATIENT PHYSICAL THERAPY LOWER EXTREMITY DISCHARGE   Patient Name: Cindy [name] MRN: [id number] DOB:12-27-1970, 53 y.o., female Today's Date: 01/21/2024  END OF SESSION:  PT End of Session - 01/21/24 1249     Visit Number 15    Number of Visits 16    Date for PT Re-Evaluation 01/27/24    PT Start Time 1145    PT Stop Time 1240    PT Time Calculation (min) 55 min    Activity Tolerance Patient tolerated treatment well    Behavior During Therapy WFL for tasks assessed/performed                    Past Medical History:  Diagnosis Date   Asthma    Hypertension    Urticaria    History reviewed. No pertinent surgical history. Patient Active Problem List   Diagnosis Date Noted   Seasonal and perennial allergic rhinitis 06/30/2019   Reactive airway disease 06/30/2019    PCP: Verena Mems, MD  REFERRING PROVIDER: Dasie Fitch, MD  REFERRING DIAG:  R knee pain  THERAPY DIAG:  Pes anserine bursitis  Rationale for Evaluation and Treatment: Rehabilitation  ONSET DATE: end of february  SUBJECTIVE:   SUBJECTIVE STATEMENT: Pt reports increased pain in inferior/medial knee. Saw MD just prior to visit today. States MD offered a surgical repair and pt is planning to have this done as soon as possible.   Eval: Pt had acute onset of R knee pain in late February. Went to ED and was diagnosed with bursitis. Has difficulty walking and bending down. Feels pain the most when she is walking with 4/10 pain at worst. Knee has gradually been getting stiffer due to muscle tightness. Feels like her gait has been off since the onset of pain. Wants to get back to walking in her neighborhood and possibly work.   PERTINENT HISTORY: Asthma Hypertension Urticaria PAIN:  Are you having pain? yes: NPRS scale: 1-2/10 Pain location: medial joint line, superior to patella Pain description:  Aggravating factors: walking, squatting Relieving factors: stretching  PRECAUTIONS:  None  RED FLAGS: None   WEIGHT BEARING RESTRICTIONS: No  FALLS:  Has patient fallen in last 6 months? No  LIVING ENVIRONMENT: 11-12 stairs  OCCUPATION: nurse  Hobbies:  Going on walks  PLOF: Independent  PATIENT GOALS: decreased pain, increased confidence while walking  NEXT MD VISIT: n/a  OBJECTIVE:  Note: Objective measures were completed at Evaluation unless otherwise noted.  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 No evidence of acute fracture or dislocation. No focal bone lesion or bone destruction. No significant effusions. Soft tissues are unremarkable.  PATIENT SURVEYS:  LEFS 45/80  7/7 LEFS 56/80  8/27 LEFS 62/80  COGNITION: Overall cognitive status: Within functional limits for tasks assessed     SENSATION: WFL  EDEMA:    MUSCLE LENGTH:   POSTURE: No Significant postural limitations  PALPATION: TTP pes anserine and superior patella   LUMBAR ROM:   Active  A/PROM  eval  Flexion WFL  Extension WFL  Right lateral flexion WFL  Left lateral flexion WFL  Right rotation   Left rotation    (Blank rows = not tested)  LOWER EXTREMITY ROM:  Passive ROM Right eval Left eval  Hip flexion [facility]  Hip extension    Hip abduction [facility]  Hip adduction    Hip internal rotation [facility]  painful at end range [facility]  Hip external rotation [facility]  Knee flexion    Knee extension    Ankle dorsiflexion    Ankle plantarflexion    Ankle inversion    Ankle eversion     (Blank rows = not tested)  LOWER EXTREMITY MMT:  MMT Right eval Left eval Right  10/23/23 Right  7/7 Left 7/7 Right 8/27 Left 8/27  Hip flexion 22.9 27.1 34.1 37.1 37.6 47.2 49.3  Hip extension         Hip abduction 37.3 56.9 39.7 43.6  51.5 24.8 (at ankle)/60.4(at knee) 31.9 (at ankle)/61.7 at knee)  Hip adduction         Hip internal  rotation         Hip external rotation         Knee flexion         Knee extension 34.5 57.8 46.9 41.9 49.1 38.3 43.7  Ankle dorsiflexion         Ankle plantarflexion         Ankle inversion         Ankle eversion          (Blank rows = not tested)  LOWER EXTREMITY SPECIAL TESTS:    FUNCTIONAL TESTS:     GAIT: Distance walked: 72ft Assistive device utilized: None Level of assistance: Complete Independence Comments: decreased stance time on R, shifting to left because of pain and weakness   Treatment Date:   8/27 Upright bike x56min STM medial HS, tib anterior PROM R knee Fig 4 bridge x15bil  Updated strength LEFS Reviewed goals Supine SLR with ER 1# 3x10 Sidelying hip abduction 1# 2x15ea Reviewed gym machines, TRX, cable machine Updated and reviewed HEP   8/19 There ex: Leg press Cybex 100 pounds 3 x 12  Upright bike L5 Single leg bridges x20 SLR with ER 1# 3x10 S/l hip abduction 1# 3x10  TherAct: 4inch lateral step down 2x10 (cues for glute engagement) 4 fwd step down 2x10   Manual: trigger point release to medial gastroc and medial hamstring, posterior knee Patella mobilization   Neuro re-ed: SLS on airex 3x30sec SL hip hinge 2x10   PATIENT EDUCATION:  Education details: HEP, symptom management, self care Person educated: Patient Education method: Explanation, Demonstration, Tactile cues, Verbal cues, and Handouts Education comprehension: verbalized understanding and returned demonstration  HOME EXERCISE PROGRAM: Access Code: [email or web address] URL: [email or web address] Date: 10/02/2023 Prepared by: Georgetta Moulds  Exercises - Seated Long Arc Quad  - 1 x daily - 7 x weekly - 3 sets - 10 reps - Seated Hip Abduction with Resistance  - 1 x daily - 7 x weekly - 3 sets - 10 reps - Seated Hamstring Stretch  - 1 x daily - 7 x weekly - 3 sets - 10 reps - Standing Gastroc Stretch at Counter  - 1 x daily - 7 x weekly - 3 sets - 10 reps -  Standing Gastroc Stretch on Foam 1/2 Roll  - 1 x daily - 7 x weekly - 3 sets - 10 reps  ASSESSMENT:  CLINICAL IMPRESSION: Pt has attended visits thus far and has made excellent progress. Pt has met 3/4STG and 1/3 LTG. LEFS has improved to 62/80.  Hip abduction remains weak (testing in s/l today) and added exercises to HEP for pt to continue working on at home. She does remain limited in activity tolerance due to ongoing intermittent discomfort in medial/inferior knee. Pt is appropriate for d/c at this  time as she can safely continue with independent program. Pt planning to schedule knee surgery for this as soon as possible after meeting with MD today.  Pt will continue to work on L strengthening in addition to gym program until surgery.   Eval: Patient is a 53 y.o. female who was seen today for physical therapy evaluation and treatment for pes anserine bursitis. Symptoms were brought out with palpation and knee flex. Gait was assessed with decreased stance time on R leg and shifting away due to pain and weakness. MMT was noted with biggest differences in knee ext and hip abd. Pt was instructed on nature of condition and treatment strategies moving forward. Goal is to get back to walkig in the neighborhood and have more confidence in community ambulation. Therapeutic exercises were done with education on grading, progressions, stretching times, and symptom management. Next visit can progress exercises with showing gym alternatives.  [place] should also be considered. Pt tolerated all treatment strategies well today with no increase in symptoms. Pt will continue to benefit from skilled physical therapy to reduce irritability and increase functional strength for ADL's and recreational activities.   OBJECTIVE IMPAIRMENTS: Abnormal gait, decreased coordination, decreased endurance, decreased mobility, difficulty walking, decreased ROM, and decreased strength.   ACTIVITY LIMITATIONS: carrying, lifting, bending,  squatting, and toileting  PARTICIPATION LIMITATIONS: cleaning, laundry, shopping, community activity, and occupation  PERSONAL FACTORS: 1-2 comorbidities: asthma,  are also affecting patient's functional outcome. Occupation   REHAB POTENTIAL: Good  CLINICAL DECISION MAKING: Evolving/moderate complexity  EVALUATION COMPLEXITY: Moderate   GOALS: Goals reviewed with patient? Yes  SHORT TERM GOALS: Target date: 10/23/2023   Pt will increase knee ext strength by 5lbs for community ambulation. Baseline: Goal status: MET - 10/23/23  2.  Pt will increase hip abd strength by 5lbs for community ambulation.  Baseline:  Goal status: IN  PROGRESS -8/27  3.  Pt will have 8 point difference in LEFS score for functional improvement.  Baseline:  Goal status: MET 8/27  4.  Pt will be independent with HEP.  Baseline:  Goal status: MET 6/23    LONG TERM GOALS: Target date: 11/13/2023    Pt will increase knee ext strength by 15lbs for community ambulation. Baseline:  Goal status: IN PROGRESS 8/27  2.  Pt will increase hip abd strength by 15lbs for community ambulation.  Baseline:  Goal status: IN PROGRESS  3.  Pt will have 16 point difference in LEFS score for functional improvement. Baseline:  Goal status: MET 8/27    PLAN:  PT FREQUENCY: 1x/week  PT DURATION: 6 weeks  PLANNED INTERVENTIONS: 97164- PT Re-evaluation, 97750- Physical Performance Testing, 97110-Therapeutic exercises, 97530- Therapeutic activity, 97112- Neuromuscular re-education, [phone]- Self Care, 02859- Manual therapy, [phone]- Gait training, [phone]- Aquatic Therapy, [phone]- Electrical stimulation (unattended), [phone]- Electrical stimulation (manual), and 97033- Ionotophoresis 4mg /ml Dexamethasone  PLAN FOR NEXT SESSION: Consider ionto, take pt through gym exercises, manual therapy  Asberry Rodes, PTA   01/21/24 1:01 PM [facility] [address] [place],  [place], [address] Phone: [phone]   Fax:  [phone]

## 2024-01-21 NOTE — Progress Notes (Signed)
 Chief Complaint: Right knee pain     History of Present Illness:    Cindy [name] is a 53 y.o. female today with ongoing right knee pain along the medial joint line which has been ongoing since February 2025.  She did experience an acute pain at this time.  She has been participating in physical therapy.  She has been trying naproxen  as well as prednisone which did give her some temporary relief.  She did get an injection which did give her very good temporary relief as well.  She is still very active and is hoping to stay that way.  She is here today for further discussion    PMH/PSH/Family History/Social History/Meds/Allergies:    Past Medical History:  Diagnosis Date   Asthma    Hypertension    Urticaria    No past surgical history on file. Social History   Socioeconomic History   Marital status: Married    Spouse name: Not on file   Number of children: Not on file   Years of education: Not on file   Highest education level: Not on file  Occupational History   Not on file  Tobacco Use   Smoking status: Never   Smokeless tobacco: Never  Vaping Use   Vaping status: Never Used  Substance and Sexual Activity   Alcohol use: Not Currently   Drug use: Never   Sexual activity: Not on file  Other Topics Concern   Not on file  Social History Narrative   Not on file   Social Drivers of Health   Financial Resource Strain: Not on file  Food Insecurity: Not on file  Transportation Needs: Not on file  Physical Activity: Not on file  Stress: Not on file  Social Connections: Not on file   Family History  Problem Relation Age of Onset   Allergic rhinitis Father    Asthma Father    Asthma Brother    Allergies  Allergen Reactions   Doxycycline Other (See Comments)   Iron Other (See Comments)   Minocycline Other (See Comments)   Other Other (See Comments)   Current Outpatient Medications  Medication Sig Dispense Refill   acetaminophen  (TYLENOL ) 500 MG tablet Take  1 tablet (500 mg total) by mouth every 8 (eight) hours for 10 days. 30 tablet 0   aspirin  EC 325 MG tablet Take 1 tablet (325 mg total) by mouth daily. 14 tablet 0   ibuprofen  (ADVIL ) 800 MG tablet Take 1 tablet (800 mg total) by mouth every 8 (eight) hours for 10 days. Please take with food, please alternate with acetaminophen  30 tablet 0   oxyCODONE  (ROXICODONE ) 5 MG immediate release tablet Take 1 tablet (5 mg total) by mouth every 4 (four) hours as needed for severe pain (pain score 7-10) or breakthrough pain. 10 tablet 0   Carbinoxamine  Maleate 4 MG TABS TAKE ONE AND ONE-HALF TABLETS BY MOUTH TWICE DAILY AS NEEDED for allergies 270 tablet 1   Cholecalciferol (VITAMIN D) 125 MCG (5000 UT) CAPS Take 1 capsule by mouth daily at 12 noon.     EPINEPHrine  0.3 mg/0.3 mL IJ SOAJ injection Inject 0.3 mg into the muscle as needed for anaphylaxis. 1 each 2   levalbuterol  (XOPENEX  HFA) 45 MCG/ACT inhaler Inhale 2 puffs into the lungs every 4 (four) hours as needed for wheezing or shortness of breath (coughing). 15 g 1   naproxen  (NAPROSYN ) 500 MG tablet Take 1 tablet (500 mg total) by mouth 2 (two) times  daily. 30 tablet 0   olmesartan (BENICAR) 40 MG tablet Take 40 mg by mouth daily.     Olopatadine  HCl 0.6 % SOLN Place 1-2 sprays into both nostrils 2 (two) times daily as needed (drainage). 30.5 g 5   triamcinolone (NASACORT) 55 MCG/ACT AERO nasal inhaler Place 1 spray into the nose daily.     No current facility-administered medications for this visit.   No results found.  Review of Systems:   A ROS was performed including pertinent positives and negatives as documented in the HPI.  Physical Exam :   Constitutional: NAD and appears stated age Neurological: Alert and oriented Psych: Appropriate affect and cooperative There were no vitals taken for this visit.   Comprehensive Musculoskeletal Exam:    Right knee with tenderness about the medial joint line with positive McMurray.  Positive  effusion range of motion is from -3 to 130 degrees.  Negative Lachman, negative posterior drawer no varus valgus laxity   Imaging:   Xray (4 views right knee): Normal  MRI (right knee): Medial meniscal root tear with extravasation   I personally reviewed and interpreted the radiographs.   Assessment and Plan:   53 y.o. female with a right knee medial meniscal root tear with extravasation.  At this time she has tried physical therapy as well as injection without any relief.  She does have some mild chondral thinning but overall I did discuss the literature showing that meniscal root repair can provide good symptomatic relief with preservation of the knee for a longer duration of time to avoid any type of arthroplasty options.  I did discuss risks limitations associated with this as well as the associated recovery timeframe.  After discussion she would like to proceed  -Plan for right knee arthroscopy with medial meniscal root repair   After a lengthy discussion of treatment options, including risks, benefits, alternatives, complications of surgical and nonsurgical conservative options, the patient elected surgical repair.   The patient  is aware of the material risks  and complications including, but not limited to injury to adjacent structures, neurovascular injury, infection, numbness, bleeding, implant failure, thermal burns, stiffness, persistent pain, failure to heal, disease transmission from allograft, need for further surgery, dislocation, anesthetic risks, blood clots, risks of death,and others. The probabilities of surgical success and failure discussed with patient given their particular co-morbidities.The time and nature of expected rehabilitation and recovery was discussed.The patient's questions were all answered preoperatively.  No barriers to understanding were noted. I explained the natural history of the disease process and Rx rationale.  I explained to the patient what I  considered to be reasonable expectations given their personal situation.  The final treatment plan was arrived at through a shared patient decision making process model.    I personally saw and evaluated the patient, and participated in the management and treatment plan.  Elspeth Parker, MD Attending Physician, Orthopedic Surgery  This document was dictated using Dragon voice recognition software. A reasonable attempt at proof reading has been made to minimize errors.

## 2024-01-22 ENCOUNTER — Encounter (HOSPITAL_BASED_OUTPATIENT_CLINIC_OR_DEPARTMENT_OTHER): Payer: Self-pay

## 2024-01-27 ENCOUNTER — Ambulatory Visit (INDEPENDENT_AMBULATORY_CARE_PROVIDER_SITE_OTHER)

## 2024-01-27 DIAGNOSIS — J309 Allergic rhinitis, unspecified: Secondary | ICD-10-CM

## 2024-01-29 ENCOUNTER — Encounter: Payer: Self-pay | Admitting: Allergy

## 2024-02-05 ENCOUNTER — Telehealth: Payer: Self-pay | Admitting: Orthopaedic Surgery

## 2024-02-05 NOTE — Telephone Encounter (Signed)
 I spoke with the patient today and scheduled her for surgery on 03/11/24. Patient would like to know if she will be weight bearing after surgery, and if not how long will she be non weightbearing? Please call to advise.

## 2024-02-05 NOTE — Telephone Encounter (Signed)
 Returned call to patient informing her she wil be WBAT after surgery however she will likely get a nerve block so the first few days may be sore post op for walking

## 2024-02-06 ENCOUNTER — Ambulatory Visit: Admitting: Allergy

## 2024-02-06 ENCOUNTER — Other Ambulatory Visit: Payer: Self-pay

## 2024-02-06 ENCOUNTER — Encounter: Payer: Self-pay | Admitting: Allergy

## 2024-02-06 VITALS — BP 130/78 | HR 68 | Temp 98.5°F | Resp 18 | Ht 61.0 in | Wt 134.3 lb

## 2024-02-06 DIAGNOSIS — R12 Heartburn: Secondary | ICD-10-CM

## 2024-02-06 DIAGNOSIS — J3089 Other allergic rhinitis: Secondary | ICD-10-CM

## 2024-02-06 DIAGNOSIS — J301 Allergic rhinitis due to pollen: Secondary | ICD-10-CM

## 2024-02-06 DIAGNOSIS — J4530 Mild persistent asthma, uncomplicated: Secondary | ICD-10-CM | POA: Diagnosis not present

## 2024-02-06 DIAGNOSIS — Z91038 Other insect allergy status: Secondary | ICD-10-CM

## 2024-02-06 DIAGNOSIS — J3081 Allergic rhinitis due to animal (cat) (dog) hair and dander: Secondary | ICD-10-CM | POA: Diagnosis not present

## 2024-02-06 MED ORDER — RYALTRIS 665-25 MCG/ACT NA SUSP: 1.0000 | Freq: Two times a day (BID) | NASAL | 5 refills | Status: AC

## 2024-02-06 MED ORDER — EPINEPHRINE 0.3 MG/0.3ML IJ SOAJ: 0.3000 mg | INTRAMUSCULAR | 2 refills | Status: DC | PRN

## 2024-02-06 MED ORDER — ARNUITY ELLIPTA 100 MCG/ACT IN AEPB: 1.0000 | INHALATION_SPRAY | Freq: Every day | RESPIRATORY_TRACT | 5 refills | Status: AC

## 2024-02-06 MED ORDER — MONTELUKAST SODIUM 10 MG PO TABS: 10.0000 mg | ORAL_TABLET | Freq: Every day | ORAL | 5 refills | Status: AC

## 2024-02-06 NOTE — Patient Instructions (Addendum)
 Allergic rhinitis Past skin testing positive to dust mites and cats, trees, mold, cockroach. Continue allergy injections - every 4 weeks.  Make sure you carry your Epipen  on the days of your injections.  Continue environmental control measures May use carbinoxamine  4mg  1 to 1.5 tablet 1-2 times a day as needed.  Start [name]  (olopatadine  + mometasone nasal spray combination) 1-2 sprays per nostril twice a day. Sample given. This replaces your other nasal sprays. If this works well for you, then have pharmacy ship the medication to your home - prescription already sent in.  Nasal saline spray (i.e., Simply Saline) or nasal saline lavage (i.e., NeilMed) is recommended as needed and prior to medicated nasal sprays. May take Singulair  (montelukast ) 10mg  daily at night as needed.  Cautioned that in some children/adults can experience behavioral changes including hyperactivity, agitation, depression, sleep disturbances and suicidal ideations. These side effects are rare, but if you notice them you should notify me and discontinue Singulair  (montelukast ).  Reactive airway disease Today's spirometry was normal.  Daily controller medication(s): Start Arnuity 100mcg 1 puff once a day and rinse mouth after each use.  May use levoalbuterol rescue inhaler 2 puffs every 4 to 6 hours as needed for shortness of breath, chest tightness, coughing, and wheezing.  Monitor frequency of use - if you need to use it more than twice per week on a consistent basis let us  know.  Breathing control goals:  Full participation in all desired activities (may need albuterol before activity) Albuterol use two times or less a week on average (not counting use with activity) Cough interfering with sleep two times or less a month Oral steroids no more than once a year No hospitalizations   Heartburn See handout for lifestyle and dietary modifications. May take famotidine or over the counter omeprazole once a day as needed.    Will discuss at next visit about reintroducing fish oil.   Follow up in 2 months or sooner if needed.

## 2024-02-06 NOTE — Progress Notes (Signed)
 Follow Up Note  RE: Cindy [name] MRN: [id number] DOB: 04-15-1971 Date of Office Visit: 02/06/2024  Referring provider: Espinoza, Alejandra, DO Primary care provider: Espinoza, Alejandra, DO  Chief Complaint: Follow-up and Cough (Constant cough since July. With an itchy throat at random times. )  History of Present Illness: I had the pleasure of seeing Cindy [name] for a follow up visit at the [facility] on 02/06/2024. She is a 53 y.o. female, who is being followed for allergic rhinitis on AIT and reactive airway disease. Her previous allergy office visit was on 06/11/2023 with Dr. Luke. Today is a regular follow up visit.  Discussed the use of AI scribe software for clinical note transcription with the patient, who gave verbal consent to proceed.    She has been experiencing a persistent cough and wheezing, particularly at night, which started with an itchy throat. The cough has persisted despite treatment with zpak and prednisone, which initially improved her symptoms but did not resolve them completely. The cough is accompanied by wheezing, especially at night, causing her to wake up and preventing her from sleeping flat. She also experiences wheezing upon waking in the morning.  She has been receiving allergy shots and has noticed a reduction in localized itchiness. However, she stopped using Nasonex over the summer due to epistaxis, which she attributes to the medication. In July, she experienced nasal congestion and allergy symptoms, which she initially thought might be due to a virus, but tested negative for COVID-19. She has resumed using Nasonex and olopatadine  nasal spray due to the return of symptoms.  Her current medications include carbinoxamine , which she takes one and a half tablets at night, sometimes in the morning, but it causes drowsiness. She uses her albuterol inhaler after coughing spells that lead to wheezing. She questions whether acid reflux might be  contributing to her symptoms, although she does not experience typical reflux symptoms like a burning sensation.  No recent changes in her environment, such as acquiring a new pet. In addition to her respiratory issues, she has a history of a torn meniscus and arthritis. She stopped taking fish oil two weeks ago and has not noticed any changes in her symptoms. She is scheduled for surgery next month to address the torn meniscus.      Assessment and Plan: Suzzanne is a 53 y.o. female with: Seasonal allergic rhinitis due to pollen Allergic rhinitis due to animal dander Allergic rhinitis due to dust mite Allergic rhinitis due to mold Allergy to cockroaches Past history - Mood disorder with Zyrtec, declined Singulair .  2 cats at home. 2021 skin testing positive to dust mites and cats, trees, mold, cockroach. Started AIT on 12/22/2019 (TCDM & MCR). Saw ENT and normal MRI.  Interim history - was doing well and able to come off all medications until a few months ago. Persistent nasal congestion and postnasal drip causing cough and throat irritation. Continue allergy injections - every 4 weeks.  Make sure you carry your Epipen  on the days of your injections.  Continue environmental control measures May use carbinoxamine  4mg  1 to 1.5 tablet 1-2 times a day as needed.  Start [name]  (olopatadine  + mometasone nasal spray combination) 1-2 sprays per nostril twice a day. Sample given. This replaces your other nasal sprays. If this works well for you, then have pharmacy ship the medication to your home - prescription already sent in.  Nasal saline spray (i.e., Simply Saline) or nasal saline lavage (i.e., NeilMed) is recommended  as needed and prior to medicated nasal sprays. May take Singulair  (montelukast ) 10mg  daily at night as needed.  Cautioned that in some children/adults can experience behavioral changes including hyperactivity, agitation, depression, sleep disturbances and suicidal ideations. These side  effects are rare, but if you notice them you should notify me and discontinue Singulair  (montelukast ).   Not well controlled mild persistent asthma Past history - Albuterol causes palpitations.  Used to be on Symbicort for a short while in the past. 2021 spirometry showed some restriction with 6% improvement in FEV1 post bronchodilator treatment.  She clinically felt better and liked Xopenex  as it did not cause palpitation. Alvesco too expensive. Covid-19 in Feb 2023. Stopped Arnuity in July 2023.  Interim history - Exacerbated nocturnal symptoms with cough and wheezing. Improved with prednisone but recurred.  Today's spirometry normal. Daily controller medication(s): Start Arnuity 100mcg 1 puff once a day and rinse mouth after each use.  May use levoalbuterol rescue inhaler 2 puffs every 4 to 6 hours as needed for shortness of breath, chest tightness, coughing, and wheezing.  Monitor frequency of use - if you need to use it more than twice per week on a consistent basis let us  know.   Possible Heartburn See handout for lifestyle and dietary modifications. May take famotidine or over the counter omeprazole once a day as needed.   Will discuss at next visit about reintroducing fish oil - I don't think that's what triggered above episode.   Return in about 2 months (around 04/07/2024).  Meds ordered this encounter  Medications   Fluticasone  Furoate (ARNUITY ELLIPTA ) 100 MCG/ACT AEPB    Sig: Inhale 1 puff into the lungs daily. Rinse mouth after each use.    Dispense:  30 each    Refill:  5   montelukast  (SINGULAIR ) 10 MG tablet    Sig: Take 1 tablet (10 mg total) by mouth at bedtime.    Dispense:  30 tablet    Refill:  5   EPINEPHrine  0.3 mg/0.3 mL IJ SOAJ injection    Sig: Inject 0.3 mg into the muscle as needed for anaphylaxis.    Dispense:  1 each    Refill:  2    May dispense generic/teva/mylan brand. Please put Rx on file until ready for pick up.   Olopatadine -Mometasone ([name] )  665-25 MCG/ACT SUSP    Sig: Place 1-2 sprays into the nose in the morning and at bedtime.    Dispense:  29 g    Refill:  5   Lab Orders  No laboratory test(s) ordered today    Diagnostics: Spirometry:  Tracings reviewed. Her effort: Good reproducible efforts. FVC: 2.52L FEV1: 1.67L, 79% predicted FEV1/FVC ratio: 66% Interpretation: Spirometry consistent with normal pattern.  Please see scanned spirometry results for details.  Results discussed with patient/family.   Medication List:  Current Outpatient Medications  Medication Sig Dispense Refill   acetaminophen  (TYLENOL ) 500 MG tablet Take 1 tablet (500 mg total) by mouth every 8 (eight) hours for 10 days. 30 tablet 0   aspirin  EC 325 MG tablet Take 1 tablet (325 mg total) by mouth daily. 14 tablet 0   Carbinoxamine  Maleate 4 MG TABS TAKE ONE AND ONE-HALF TABLETS BY MOUTH TWICE DAILY AS NEEDED for allergies 270 tablet 1   Cholecalciferol (VITAMIN D) 125 MCG (5000 UT) CAPS Take 1 capsule by mouth daily at 12 noon.     Fluticasone  Furoate (ARNUITY ELLIPTA ) 100 MCG/ACT AEPB Inhale 1 puff into the lungs daily. Rinse mouth after  each use. 30 each 5   ibuprofen  (ADVIL ) 800 MG tablet Take 1 tablet (800 mg total) by mouth every 8 (eight) hours for 10 days. Please take with food, please alternate with acetaminophen  30 tablet 0   levalbuterol  (XOPENEX  HFA) 45 MCG/ACT inhaler Inhale 2 puffs into the lungs every 4 (four) hours as needed for wheezing or shortness of breath (coughing). 15 g 1   montelukast  (SINGULAIR ) 10 MG tablet Take 1 tablet (10 mg total) by mouth at bedtime. 30 tablet 5   naproxen  (NAPROSYN ) 500 MG tablet Take 1 tablet (500 mg total) by mouth 2 (two) times daily. 30 tablet 0   olmesartan (BENICAR) 40 MG tablet Take 40 mg by mouth daily.     Olopatadine  HCl 0.6 % SOLN Place 1-2 sprays into both nostrils 2 (two) times daily as needed (drainage). 30.5 g 5   Olopatadine -Mometasone ([name] ) 665-25 MCG/ACT SUSP Place 1-2 sprays  into the nose in the morning and at bedtime. 29 g 5   oxyCODONE  (ROXICODONE ) 5 MG immediate release tablet Take 1 tablet (5 mg total) by mouth every 4 (four) hours as needed for severe pain (pain score 7-10) or breakthrough pain. 10 tablet 0   EPINEPHrine  0.3 mg/0.3 mL IJ SOAJ injection Inject 0.3 mg into the muscle as needed for anaphylaxis. 1 each 2   No current facility-administered medications for this visit.   Allergies: Allergies  Allergen Reactions   Doxycycline Other (See Comments)   Iron Other (See Comments)   Minocycline Other (See Comments)   Other Other (See Comments)   I reviewed her past medical history, social history, family history, and environmental history and no significant changes have been reported from her previous visit.  Review of Systems  Constitutional:  Negative for appetite change, chills, fever and unexpected weight change.  HENT:  Positive for congestion and postnasal drip. Negative for rhinorrhea.   Eyes:  Negative for itching.  Respiratory:  Positive for cough and wheezing. Negative for chest tightness and shortness of breath.   Cardiovascular:  Negative for chest pain.  Gastrointestinal:  Negative for abdominal pain and nausea.  Genitourinary:  Negative for difficulty urinating.  Skin:  Negative for rash.  Allergic/Immunologic: Positive for environmental allergies.  Neurological:  Negative for headaches.    Objective: BP 130/78 (BP Location: Right Arm, Patient Position: Sitting, Cuff Size: Normal)   Pulse 68   Temp 98.5 F (36.9 C) (Temporal)   Resp 18   Ht 5' 1 (1.549 m)   Wt 134 lb 4.8 oz (60.9 kg)   SpO2 98%   BMI 25.38 kg/m  Body mass index is 25.38 kg/m. Physical Exam Vitals and nursing note reviewed.  Constitutional:      Appearance: Normal appearance. She is well-developed.  HENT:     Head: Normocephalic and atraumatic.     Right Ear: Tympanic membrane and external ear normal.     Left Ear: Tympanic membrane and external ear  normal.     Nose: Nose normal.     Mouth/Throat:     Mouth: Mucous membranes are moist.     Pharynx: Oropharynx is clear.  Eyes:     Conjunctiva/sclera: Conjunctivae normal.  Cardiovascular:     Rate and Rhythm: Normal rate and regular rhythm.     Heart sounds: Normal heart sounds. No murmur heard.    No friction rub. No gallop.  Pulmonary:     Effort: Pulmonary effort is normal.     Breath sounds: Normal breath sounds. No  wheezing or rales.  Musculoskeletal:     Cervical back: Neck supple.  Skin:    General: Skin is warm.     Findings: No rash.  Neurological:     Mental Status: She is alert and oriented to person, place, and time.  Psychiatric:        Behavior: Behavior normal.    Previous notes and tests were reviewed. The plan was reviewed with the patient/family, and all questions/concerned were addressed.  It was my pleasure to see Lasha today and participate in her care. Please feel free to contact me with any questions or concerns.  Sincerely,  Orlan Cramp, DO Allergy & Immunology  [facility]  [place] office: [phone] [facility] office: [phone]

## 2024-02-26 ENCOUNTER — Ambulatory Visit

## 2024-02-26 DIAGNOSIS — J309 Allergic rhinitis, unspecified: Secondary | ICD-10-CM

## 2024-03-10 NOTE — Therapy (Deleted)
 OUTPATIENT PHYSICAL THERAPY LOWER EXTREMITY DISCHARGE   Patient Name: Cindy [name] MRN: [id number] DOB:[birth year], 53 y.o., female Today's Date: 03/10/2024  END OF SESSION:              Past Medical History:  Diagnosis Date   Asthma    Hypertension    Urticaria    No past surgical history on file. Patient Active Problem List   Diagnosis Date Noted   Seasonal and perennial allergic rhinitis 06/30/2019   Reactive airway disease 06/30/2019    PCP: Verena Mems, MD  REFERRING PROVIDER: Dasie Fitch, MD  REFERRING DIAG:  R knee pain  THERAPY DIAG:  Pes anserine bursitis  Rationale for Evaluation and Treatment: Rehabilitation  ONSET DATE: end of february  SUBJECTIVE:   SUBJECTIVE STATEMENT: Pt reports increased pain in inferior/medial knee. Saw MD just prior to visit today. States MD offered a surgical repair and pt is planning to have this done as soon as possible.   Eval: Pt had acute onset of R knee pain in late February. Went to ED and was diagnosed with bursitis. Has difficulty walking and bending down. Feels pain the most when she is walking with 4/10 pain at worst. Knee has gradually been getting stiffer due to muscle tightness. Feels like her gait has been off since the onset of pain. Wants to get back to walking in her neighborhood and possibly work.   PERTINENT HISTORY: Asthma Hypertension Urticaria PAIN:  Are you having pain? yes: NPRS scale: 1-2/10 Pain location: medial joint line, superior to patella Pain description:  Aggravating factors: walking, squatting Relieving factors: stretching  PRECAUTIONS: None  RED FLAGS: None   WEIGHT BEARING RESTRICTIONS: No  FALLS:  Has patient fallen in last 6 months? No  LIVING ENVIRONMENT: 11-12 stairs  OCCUPATION: nurse  Hobbies:  Going on walks  PLOF: Independent  PATIENT GOALS: decreased pain, increased confidence while walking  NEXT MD VISIT: n/a  OBJECTIVE:  Note: Objective  measures were completed at Evaluation unless otherwise noted.  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 No evidence of acute fracture or dislocation. No focal bone lesion or bone destruction. No significant effusions. Soft tissues are unremarkable.  PATIENT SURVEYS:  LEFS 45/80  7/7 LEFS 56/80  8/27 LEFS 62/80  COGNITION: Overall cognitive status: Within functional limits for tasks assessed     SENSATION: WFL  EDEMA:    MUSCLE LENGTH:   POSTURE: No Significant postural limitations  PALPATION: TTP pes anserine and superior patella   LUMBAR ROM:   Active  A/PROM  eval  Flexion WFL  Extension WFL  Right lateral flexion WFL  Left lateral flexion WFL  Right rotation   Left rotation    (Blank rows = not tested)  LOWER EXTREMITY ROM:  Passive ROM Right eval Left eval  Hip flexion [facility]  Hip extension    Hip abduction [facility]  Hip adduction    Hip internal rotation WFL painful at end range [facility]  Hip external rotation [facility]  Knee flexion    Knee extension    Ankle dorsiflexion    Ankle plantarflexion    Ankle inversion    Ankle eversion     (Blank rows = not tested)  LOWER EXTREMITY MMT:  MMT Right eval Left eval Right  10/23/23 Right  7/7 Left 7/7 Right 8/27 Left 8/27  Hip flexion 22.9 27.1 34.1 37.1 37.6  47.2 49.3  Hip extension         Hip abduction 37.3 56.9 39.7 43.6  51.5 24.8 (at ankle)/60.4(at knee) 31.9 (at ankle)/61.7 at knee)  Hip adduction         Hip internal rotation         Hip external rotation         Knee flexion         Knee extension 34.5 57.8 46.9 41.9 49.1 38.3 43.7  Ankle dorsiflexion         Ankle plantarflexion         Ankle inversion         Ankle eversion          (Blank rows = not tested)  LOWER EXTREMITY SPECIAL TESTS:    FUNCTIONAL TESTS:     GAIT: Distance walked:  49ft Assistive device utilized: None Level of assistance: Complete Independence Comments: decreased stance time on R, shifting to left because of pain and weakness   Treatment Date:   8/27 Upright bike x14min STM medial HS, tib anterior PROM R knee Fig 4 bridge x15bil  Updated strength LEFS Reviewed goals Supine SLR with ER 1# 3x10 Sidelying hip abduction 1# 2x15ea Reviewed gym machines, TRX, cable machine Updated and reviewed HEP   8/19 There ex: Leg press Cybex 100 pounds 3 x 12  Upright bike L5 Single leg bridges x20 SLR with ER 1# 3x10 S/l hip abduction 1# 3x10  TherAct: 4inch lateral step down 2x10 (cues for glute engagement) 4 fwd step down 2x10   Manual: trigger point release to medial gastroc and medial hamstring, posterior knee Patella mobilization   Neuro re-ed: SLS on airex 3x30sec SL hip hinge 2x10   PATIENT EDUCATION:  Education details: HEP, symptom management, self care Person educated: Patient Education method: Explanation, Demonstration, Tactile cues, Verbal cues, and Handouts Education comprehension: verbalized understanding and returned demonstration  HOME EXERCISE PROGRAM: Access Code: [email or web address] URL: [email or web address] Date: 10/02/2023 Prepared by: Georgetta Moulds  Exercises - Seated Long Arc Quad  - 1 x daily - 7 x weekly - 3 sets - 10 reps - Seated Hip Abduction with Resistance  - 1 x daily - 7 x weekly - 3 sets - 10 reps - Seated Hamstring Stretch  - 1 x daily - 7 x weekly - 3 sets - 10 reps - Standing Gastroc Stretch at Counter  - 1 x daily - 7 x weekly - 3 sets - 10 reps - Standing Gastroc Stretch on Foam 1/2 Roll  - 1 x daily - 7 x weekly - 3 sets - 10 reps  ASSESSMENT:  CLINICAL IMPRESSION: Pt has attended visits thus far and has made excellent progress. Pt has met 3/4STG and 1/3 LTG. LEFS has improved to 62/80.  Hip abduction remains weak (testing in s/l today) and added exercises to HEP for pt to continue  working on at home. She does remain limited in activity tolerance due to ongoing intermittent discomfort in medial/inferior knee. Pt is appropriate for d/c at this time as she can safely continue with independent program. Pt planning to schedule knee surgery for this as soon as possible after meeting with MD today.  Pt will continue to work on L strengthening in addition to gym program until surgery.   Eval: Patient is a 53 y.o. female who was seen today for physical therapy evaluation and treatment for pes anserine bursitis. Symptoms were brought out with palpation and knee flex. Gait was assessed with  decreased stance time on R leg and shifting away due to pain and weakness. MMT was noted with biggest differences in knee ext and hip abd. Pt was instructed on nature of condition and treatment strategies moving forward. Goal is to get back to walkig in the neighborhood and have more confidence in community ambulation. Therapeutic exercises were done with education on grading, progressions, stretching times, and symptom management. Next visit can progress exercises with showing gym alternatives.  [place] should also be considered. Pt tolerated all treatment strategies well today with no increase in symptoms. Pt will continue to benefit from skilled physical therapy to reduce irritability and increase functional strength for ADL's and recreational activities.   OBJECTIVE IMPAIRMENTS: Abnormal gait, decreased coordination, decreased endurance, decreased mobility, difficulty walking, decreased ROM, and decreased strength.   ACTIVITY LIMITATIONS: carrying, lifting, bending, squatting, and toileting  PARTICIPATION LIMITATIONS: cleaning, laundry, shopping, community activity, and occupation  PERSONAL FACTORS: 1-2 comorbidities: asthma,  are also affecting patient's functional outcome. Occupation   REHAB POTENTIAL: Good  CLINICAL DECISION MAKING: Evolving/moderate complexity  EVALUATION COMPLEXITY:  Moderate   GOALS: Goals reviewed with patient? Yes  SHORT TERM GOALS: Target date: 10/23/2023   Pt will increase knee ext strength by 5lbs for community ambulation. Baseline: Goal status: MET - 10/23/23  2.  Pt will increase hip abd strength by 5lbs for community ambulation.  Baseline:  Goal status: IN  PROGRESS -8/27  3.  Pt will have 8 point difference in LEFS score for functional improvement.  Baseline:  Goal status: MET 8/27  4.  Pt will be independent with HEP.  Baseline:  Goal status: MET 6/23    LONG TERM GOALS: Target date: 11/13/2023    Pt will increase knee ext strength by 15lbs for community ambulation. Baseline:  Goal status: IN PROGRESS 8/27  2.  Pt will increase hip abd strength by 15lbs for community ambulation.  Baseline:  Goal status: IN PROGRESS  3.  Pt will have 16 point difference in LEFS score for functional improvement. Baseline:  Goal status: MET 8/27    PLAN:  PT FREQUENCY: 1x/week  PT DURATION: 6 weeks  PLANNED INTERVENTIONS: 97164- PT Re-evaluation, 97750- Physical Performance Testing, 97110-Therapeutic exercises, 97530- Therapeutic activity, [id number]- Neuromuscular re-education, 97535- Self Care, [id number]- Manual therapy, [phone]- Gait training, [phone]- Aquatic Therapy, [phone]- Electrical stimulation (unattended), [phone]- Electrical stimulation (manual), and 97033- Ionotophoresis 4mg /ml Dexamethasone  PLAN FOR NEXT SESSION: Consider ionto, take pt through gym exercises, manual therapy  Rojean Batten PT, DPT 03/10/24  11:06 AM

## 2024-03-11 ENCOUNTER — Encounter (HOSPITAL_BASED_OUTPATIENT_CLINIC_OR_DEPARTMENT_OTHER): Payer: Self-pay | Admitting: Orthopaedic Surgery

## 2024-03-11 DIAGNOSIS — S83241A Other tear of medial meniscus, current injury, right knee, initial encounter: Secondary | ICD-10-CM | POA: Diagnosis not present

## 2024-03-11 HISTORY — PX: KNEE SURGERY: SHX244

## 2024-03-11 NOTE — Progress Notes (Signed)
 Date of Surgery: 03/11/2024  INDICATIONS: Cindy [name] is a 53 y.o.-year-old female with right knee medial meniscal tear.  The risk and benefits of the procedure were discussed in detail and documented in the pre-operative evaluation.   PREOPERATIVE DIAGNOSIS: 1. Right knee medial meniscal tear  POSTOPERATIVE DIAGNOSIS: Same.  PROCEDURE: 1. Right knee medial meniscal repair  SURGEON: Elspeth LITTIE Parker MD  ASSISTANT: Conley Dawson, ATC  ANESTHESIA:  general  IV FLUIDS AND URINE: See anesthesia record.  ANTIBIOTICS: Ancef  ESTIMATED BLOOD LOSS: 5 mL.  IMPLANTS:  * No surgical log found *  DRAINS: None  CULTURES: None  COMPLICATIONS: none  DESCRIPTION OF PROCEDURE:   Examination under anesthesia: A careful examination under anesthesia was performed.  Knee ROM motion was: -3 - 135 Lachman: Normal Pivot Shift: Normal Posterior drawer: normal.   Varus stability in full extension: normal.   Varus stability in 30 degrees of flexion: normal.  Valgus stability in full extension: normal.   Valgus stability in 30 degrees of flexion: normal.  Posterolateral drawer: normal   Intra-operative findings: A thorough arthroscopic examination of the knee was performed.  The findings are: 1. Suprapatellar pouch: Normal 2. Undersurface of median ridge: Normal 3. Medial patellar facet: Normal 4. Lateral patellar facet: Normal 5. Trochlea: Normal 6. Lateral gutter/popliteus tendon: Normal 7. Hoffa's fat pad: Normal 8. Medial gutter/plica: Normal 9. ACL: Normal 10. PCL: Normal 11. Medial meniscus: White white junction tear at posterior third, body junction with extrusion 12. Medial compartment cartilage: Grade 2 cartilage changes 13. Lateral meniscus: Normal 14. Lateral compartment cartilage: Normal   I identified the patient in the pre-operative holding area.  I marked the operative knee with my initials. I reviewed the risks and benefits of the proposed surgical intervention and  the patient wished to proceed.  Anesthesia performed a peripheral nerve block.  Patient was subsequently taken back to the operating room.  The patient was transferred to the operative suite and placed in the supine position with all bony prominences padded.     SCDs were placed on the non-operative lower extremity. Appropriate antibiotics was administered within 1 hour before incision. The operative lower extremity was then prepped and draped in standard fashion. A time out was performed confirming the correct extremity, correct patient and correct procedure.    A standard anterolateral portal was made with an 11 blade.  The ideal position for the anteromedial portal was established using a spinal needle.  This AM portal was then created under direct visualization with an 11 blade.  A full diagnostic arthroscopy was then performed, as described above, including probing of the chondral and meniscal surfaces.     The meniscus white white tear was debrided. Because of the extrusion seen on MRI the decision was made to perform centralizaiton. At this time the decision was made to perform a meniscal repair/centralization based on the profile of the tear.  The portal was extended proximally.  The drill guide was introduced in the proximal portion of the portal and was drilled just next to the inner portion of the meniscus.  A universal lasso was introduced and wrapped around the meniscocapsular junction.  This was used to feed the passing suture around the meniscocapsular junction.  These were retrieved from the inferior portion of the portal and fed into the knotless mechanism.  This was subsequently tensioned with excellent restoration of the meniscus into the joint.    That concluded the case.  Skin was closed with 2-0 Vicryl and  3-0 nylon. Xeroform gauze, gauze, Tegaderm, Iceman and brace were applied.  Instrument, sponge, and needle counts were correct prior to wound closure and at the conclusion of the  case.  The patient was taken to the PACU without complication   POSTOPERATIVE PLAN: She will be weight bearing as tolerated left leg. She will follow the meniscal repair protocol. She will be placed on aspirin  for DVT prophylaxis.  Elspeth LITTIE Parker, MD 4:40 PM

## 2024-03-15 ENCOUNTER — Ambulatory Visit (HOSPITAL_BASED_OUTPATIENT_CLINIC_OR_DEPARTMENT_OTHER): Attending: Sports Medicine | Admitting: Physical Therapy

## 2024-03-15 ENCOUNTER — Encounter (HOSPITAL_BASED_OUTPATIENT_CLINIC_OR_DEPARTMENT_OTHER): Payer: Self-pay | Admitting: Physical Therapy

## 2024-03-15 ENCOUNTER — Other Ambulatory Visit: Payer: Self-pay

## 2024-03-15 DIAGNOSIS — S83241A Other tear of medial meniscus, current injury, right knee, initial encounter: Secondary | ICD-10-CM | POA: Diagnosis present

## 2024-03-15 DIAGNOSIS — M6281 Muscle weakness (generalized): Secondary | ICD-10-CM | POA: Insufficient documentation

## 2024-03-15 DIAGNOSIS — M25561 Pain in right knee: Secondary | ICD-10-CM | POA: Diagnosis present

## 2024-03-15 DIAGNOSIS — R2689 Other abnormalities of gait and mobility: Secondary | ICD-10-CM | POA: Insufficient documentation

## 2024-03-15 NOTE — Therapy (Signed)
 OUTPATIENT PHYSICAL THERAPY LOWER EXTREMITY EVALUATION   Patient Name: Cindy [name] MRN: [id number] DOB:[birth year], 53 y.o., female Today's Date: 03/15/2024  END OF SESSION:  PT End of Session - 03/15/24 0851     Visit Number 1    Number of Visits 16    Date for Recertification  05/26/24    Authorization Type [employer]    Progress Note Due on Visit 10    PT Start Time 0850    PT Stop Time 0930    PT Time Calculation (min) 40 min    Activity Tolerance Patient tolerated treatment well    Behavior During Therapy WFL for tasks assessed/performed          Past Medical History:  Diagnosis Date   Asthma    Hypertension    Urticaria    History reviewed. No pertinent surgical history. Patient Active Problem List   Diagnosis Date Noted   Seasonal and perennial allergic rhinitis 06/30/2019   Reactive airway disease 06/30/2019    PCP: Espinoza, Alejandra, DO  REFERRING PROVIDER: Genelle Standing, MD  REFERRING DIAG:  Acute medial meniscus tear of right knee, initial encounter [D16.758J]   THERAPY DIAG:  Acute pain of right knee  Muscle weakness (generalized)  Other abnormalities of gait and mobility  Rationale for Evaluation and Treatment: Rehabilitation  ONSET DATE: 03/11/24  SUBJECTIVE:   SUBJECTIVE STATEMENT: Pt has a long history of  knee pain. She has had PT in the past, which did not resolve the pain. She has extensive damage in her knee per her most recent MRI. She received a meniscal repair on 03/11/24. Pt is a Engineer, civil (consulting). She would like to get back to walking.   PERTINENT HISTORY: Asthma Hypertension Urticaria PAIN:  Are you having pain? Yes: NPRS scale: 5/10 with WB.  Pain location: R knee at inferior patella.  Pain description: Dull ache, pulling when standing.  Aggravating factors: Standing, walking.  Relieving factors: Sitting, resting, medicine.   PRECAUTIONS: Knee, meniscal repair   RED FLAGS: None   WEIGHT BEARING RESTRICTIONS: Yes  WBAT with AD  FALLS:  Has patient fallen in last 6 months? No  LIVING ENVIRONMENT: 11-12 stairs  OCCUPATION: Nurse  PLOF: Independent  PATIENT GOALS: decreased pain, increased confidence while walking   NEXT MD VISIT: 03/25/24  OBJECTIVE:  Note: Objective measures were completed at Evaluation unless otherwise noted.  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 No evidence of acute fracture or dislocation. No focal bone lesion or bone destruction. No significant effusions. Soft tissues are unremarkable.  PATIENT SURVEYS:  LEFS: 27/80 33.75%   COGNITION: Overall cognitive status: Within functional limits for tasks assessed     SENSATION: WFL  POSTURE: No Significant postural limitations  PALPATION: Tenderness at surgical site.   LOWER EXTREMITY ROM:  Passive ROM Right eval Left eval  Hip flexion    Hip extension    Hip abduction    Hip adduction    Hip internal rotation    Hip external rotation    Knee flexion 78 degrees  0  Knee extension    Ankle dorsiflexion    Ankle plantarflexion    Ankle inversion    Ankle eversion     (Blank rows = not tested)  LOWER EXTREMITY MMT:  MMT Right eval Left eval  Hip flexion    Hip extension    Hip abduction    Hip adduction  Hip internal rotation    Hip external rotation    Knee flexion    Knee extension    Ankle dorsiflexion    Ankle plantarflexion    Ankle inversion    Ankle eversion     (Blank rows = not tested)  LOWER EXTREMITY SPECIAL TESTS:    FUNCTIONAL TESTS:   GAIT: Distance walked: 1ft Assistive device utilized: 1x axillary crutch  Level of assistance: Complete Independence Comments: Walks with an antalgic gait and WBAT.                                                                                                                                  TREATMENT DATE: 03/15/24  Changed dressings SLR with brace Heel slides to tolerance  Quad sets   PATIENT EDUCATION:  Education details: Educated pt on anatomy and physiology of current symptoms, LEFS, diagnosis, prognosis, HEP,  and POC. Person educated: Patient Education method: Explanation, Demonstration, Verbal cues, and Handouts Education comprehension: verbalized understanding and returned demonstration  HOME EXERCISE PROGRAM: Access Code: [email or web address] URL: [email or web address] Date: 03/15/2024 Prepared by: Rojean Batten  Exercises - Supine Quad Set  - 1-2 x daily - 7 x weekly - 2-3 sets - 10 reps - Supine Active Straight Leg Raise  - 1-2 x daily - 7 x weekly - 2-3 sets - 10 reps - Supine Heel Slide with Strap  - 1-2 x daily - 7 x weekly - 2-3 sets - 10 reps  ASSESSMENT:  CLINICAL IMPRESSION: Patient is a 53 y.o. F who was seen today for physical therapy evaluation and treatment for S/P R meniscal repair . No signs of infection or DVT's today. Surgical site looks clean. Applied InteguDerm and guaze over stitches. Provided pt with extra's. Pt tolerated session well today. She was limited by pain and had a few lower back spasms. Patient will benefit from skilled PT to address below impairments, limitations and improve overall function.   ACTIVITY LIMITATIONS: bending, lifting, carry, locomotion, cleaning, community activity, driving, and or occupation  PERSONAL FACTORS: Asthma, Hypertension, Urticaria. Hx of ongoing knee pain are also affecting patient's functional outcome.  REHAB POTENTIAL: Good  CLINICAL DECISION MAKING: Stable/uncomplicated  EVALUATION COMPLEXITY: Low    GOALS: Short term PT Goals Target date: 03/29/24 Pt will be I and compliant with HEP. Baseline:  Goal status: New Pt will decrease pain by 25% overall Baseline: Goal status: New  Long term PT goals Target date: 05/26/24 Pt will improve ROM to [facility] to improve functional  mobility Baseline: Goal status: New Pt will improve  hip/knee strength to at least 5-/5 MMT to improve functional strength Baseline: Goal status: New Pt will improve LEFS by 9 points to show improved function Baseline:27 Goal status: New Pt will reduce pain by overall 50% overall with usual activity Baseline: Goal status: New Pt will reduce pain to overall less than 2-3/10 with usual activity and work activity. Baseline: Goal status: New Pt  will be able to ambulate community distances at least 1000 ft WNL gait pattern without complaints Baseline: Goal status: New  PLAN: PT FREQUENCY: 1-2 times per week   PT DURATION: 8-12 weeks  PLANNED INTERVENTIONS (unless contraindicated): aquatic PT, Canalith repositioning, cryotherapy, Electrical stimulation, Iontophoresis with 4 mg/ml dexamethasome, Moist heat, traction, Ultrasound, gait training, Therapeutic exercise, balance training, neuromuscular re-education, patient/family education, prosthetic training, manual techniques, passive ROM, dry needling, taping, vasopnuematic device, vestibular, spinal manipulations, joint manipulations  PLAN FOR NEXT SESSION: Progress per protocol.      Rojean JONELLE Batten, PT 03/15/2024, 9:58 AM

## 2024-03-16 NOTE — Addendum Note (Signed)
 Addended by: MEDFORD ROJEAN SAUNDERS on: 03/16/2024 07:03 AM   Modules accepted: Orders

## 2024-03-18 NOTE — Therapy (Signed)
 OUTPATIENT PHYSICAL THERAPY LOWER EXTREMITY EVALUATION   Patient Name: Cindy [name] MRN: [id number] DOB:[birth year], 53 y.o., female Today's Date: 03/22/2024  END OF SESSION:  PT End of Session - 03/22/24 0932     Visit Number 2    Number of Visits 16    Date for Recertification  05/26/24    Authorization Type [employer]    Progress Note Due on Visit 10    PT Start Time 0845    PT Stop Time 0930    PT Time Calculation (min) 45 min    Equipment Utilized During Treatment Gait belt    Activity Tolerance Patient tolerated treatment well    Behavior During Therapy WFL for tasks assessed/performed           Past Medical History:  Diagnosis Date   Asthma    Hypertension    Urticaria    No past surgical history on file. Patient Active Problem List   Diagnosis Date Noted   Seasonal and perennial allergic rhinitis 06/30/2019   Reactive airway disease 06/30/2019    PCP: Espinoza, Alejandra, DO  REFERRING PROVIDER: Genelle Standing, MD  REFERRING DIAG:  Acute medial meniscus tear of right knee, initial encounter [D16.758J]   THERAPY DIAG:  Acute pain of right knee  Muscle weakness (generalized)  Other abnormalities of gait and mobility  Rationale for Evaluation and Treatment: Rehabilitation  ONSET DATE: 03/11/24  SUBJECTIVE:   SUBJECTIVE STATEMENT:  Pt is 1 week and 4 days post op. She reports intermittent pain that is manageable without pain meds. She has red spots on her lower leg that she thinks may have been due to a side affect of the pain meds and Asprin.   Pt has a long history of  knee pain. She has had PT in the past, which did not resolve the pain. She has extensive damage in her knee per her most recent MRI. She received a meniscal repair on 03/11/24. 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   PERTINENT HISTORY: Asthma Hypertension Urticaria PAIN:  Are you having pain? Yes: NPRS scale: 5/10 with WB.  Pain location: R knee at  inferior patella.  Pain description: Dull ache, pulling when standing.  Aggravating factors: Standing, walking.  Relieving factors: Sitting, resting, medicine.   PRECAUTIONS: Knee, meniscal repair   RED FLAGS: None   WEIGHT BEARING RESTRICTIONS: Yes WBAT with AD  FALLS:  Has patient fallen in last 6 months? No  LIVING ENVIRONMENT: 11-12 stairs  OCCUPATION: Nurse  PLOF: Independent  PATIENT GOALS: decreased pain, increased confidence while walking   NEXT MD VISIT: 03/25/24  OBJECTIVE:  Note: Objective measures were completed at Evaluation unless otherwise noted.  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 No evidence of acute fracture or dislocation. No focal bone lesion or bone destruction. No significant effusions. Soft tissues are unremarkable.  PATIENT SURVEYS:  LEFS: 27/80 33.75%   COGNITION: Overall cognitive status: Within functional limits for tasks assessed     SENSATION: WFL  POSTURE: No Significant postural limitations  PALPATION: Tenderness at surgical site.   LOWER EXTREMITY ROM:  Passive ROM Right eval Left eval  Hip flexion    Hip extension    Hip abduction    Hip adduction    Hip internal rotation    Hip external rotation    Knee flexion 78 degrees  0  Knee extension  Ankle dorsiflexion    Ankle plantarflexion    Ankle inversion    Ankle eversion     (Blank rows = not tested)  LOWER EXTREMITY MMT:  MMT Right eval Left eval  Hip flexion    Hip extension    Hip abduction    Hip adduction    Hip internal rotation    Hip external rotation    Knee flexion    Knee extension    Ankle dorsiflexion    Ankle plantarflexion    Ankle inversion    Ankle eversion     (Blank rows = not tested)  LOWER EXTREMITY SPECIAL TESTS:    FUNCTIONAL TESTS:   GAIT: Distance walked: 57ft Assistive device  utilized: 1x axillary crutch  Level of assistance: Complete Independence Comments: Walks with an antalgic gait and WBAT.                                                                                                                                 TREATMENT DATE: 03/22/24: Danae sets with grade 1 contraction Russian: Duty cycle 50%, cycle time 10/10, ramp 5 sec, treatment time 10 min Heel slides to 90 degrees with ease.  SLR with brace with focus on good quad contraction prior to movement SL abduction with brace on.     03/15/24  Changed dressings SLR with brace Heel slides to tolerance  Quad sets   PATIENT EDUCATION:  Education details: Educated pt on anatomy and physiology of current symptoms, LEFS, diagnosis, prognosis, HEP,  and POC. Person educated: Patient Education method: Explanation, Demonstration, Verbal cues, and Handouts Education comprehension: verbalized understanding and returned demonstration  HOME EXERCISE PROGRAM: Access Code: [email or web address] URL: [email or web address] Date: 03/15/2024 Prepared by: Rojean Batten  Exercises - Supine Quad Set  - 1-2 x daily - 7 x weekly - 2-3 sets - 10 reps - Supine Active Straight Leg Raise  - 1-2 x daily - 7 x weekly - 2-3 sets - 10 reps - Supine Heel Slide with Strap  - 1-2 x daily - 7 x weekly - 2-3 sets - 10 reps  ASSESSMENT:  CLINICAL IMPRESSION:  Pt arrives to PT with brace and 1 axillary crutch. She has a rash present on her R LE, that she thinks is due to the medication. No signs of DVT present. She tolerated session well. Russian used to help facilitate improved quad contraction with quad sets today. She has good form with SLR and hip abduction with brace donned. She reports fatigue, but no pain. Practiced stairs without crutches and use of railing. Discussed use of 1x axillary crutch if needed. Also worked on weight shifts to increase self confidence with weight bearing through R LE. Pt will continue to  benefit from skilled PT to address continued deficits.    Eval: Patient is a 53 y.o. F who was seen today for physical therapy evaluation and treatment for S/P R meniscal repair . No signs of  infection or DVT's today. Surgical site looks clean. Applied InteguDerm and guaze over stitches. Provided pt with extra's. Pt tolerated session well today. She was limited by pain and had a few lower back spasms. Patient will benefit from skilled PT to address below impairments, limitations and improve overall function.   ACTIVITY LIMITATIONS: bending, lifting, carry, locomotion, cleaning, community activity, driving, and or occupation  PERSONAL FACTORS: Asthma, Hypertension, Urticaria. Hx of ongoing knee pain are also affecting patient's functional outcome.  REHAB POTENTIAL: Good  CLINICAL DECISION MAKING: Stable/uncomplicated  EVALUATION COMPLEXITY: Low    GOALS: Short term PT Goals Target date: 03/29/24 Pt will be I and compliant with HEP. Baseline:  Goal status: New Pt will decrease pain by 25% overall Baseline: Goal status: New  Long term PT goals Target date: 05/26/24 Pt will improve ROM to [facility] to improve functional mobility Baseline: Goal status: New Pt will improve  hip/knee strength to at least 5-/5 MMT to improve functional strength Baseline: Goal status: New Pt will improve LEFS by 9 points to show improved function Baseline:27 Goal status: New Pt will reduce pain by overall 50% overall with usual activity Baseline: Goal status: New Pt will reduce pain to overall less than 2-3/10 with usual activity and work activity. Baseline: Goal status: New Pt will be able to ambulate community distances at least 1000 ft WNL gait pattern without complaints Baseline: Goal status: New  PLAN: PT FREQUENCY: 1-2 times per week   PT DURATION: 8-12 weeks  PLANNED INTERVENTIONS (unless contraindicated): aquatic PT, Canalith repositioning, cryotherapy, Electrical stimulation,  Iontophoresis with 4 mg/ml dexamethasome, Moist heat, traction, Ultrasound, gait training, Therapeutic exercise, balance training, neuromuscular re-education, patient/family education, prosthetic training, manual techniques, passive ROM, dry needling, taping, vasopnuematic device, vestibular, spinal manipulations, joint manipulations  PLAN FOR NEXT SESSION: Progress per protocol.      Rojean JONELLE Batten, PT 03/22/2024, 9:33 AM

## 2024-03-22 ENCOUNTER — Ambulatory Visit (HOSPITAL_BASED_OUTPATIENT_CLINIC_OR_DEPARTMENT_OTHER): Payer: Self-pay | Admitting: Physical Therapy

## 2024-03-22 ENCOUNTER — Encounter (HOSPITAL_BASED_OUTPATIENT_CLINIC_OR_DEPARTMENT_OTHER): Payer: Self-pay | Admitting: Physical Therapy

## 2024-03-22 DIAGNOSIS — M25561 Pain in right knee: Secondary | ICD-10-CM

## 2024-03-22 DIAGNOSIS — M6281 Muscle weakness (generalized): Secondary | ICD-10-CM

## 2024-03-22 DIAGNOSIS — R2689 Other abnormalities of gait and mobility: Secondary | ICD-10-CM

## 2024-03-25 ENCOUNTER — Ambulatory Visit (INDEPENDENT_AMBULATORY_CARE_PROVIDER_SITE_OTHER): Admitting: Orthopaedic Surgery

## 2024-03-25 DIAGNOSIS — S83241A Other tear of medial meniscus, current injury, right knee, initial encounter: Secondary | ICD-10-CM

## 2024-03-25 NOTE — Progress Notes (Signed)
 Post Operative Evaluation    Procedure/Date of Surgery: Right knee medial meniscus repair 10/16  Interval History:   Presents 2 weeks status post above procedure.  Overall she is doing well.  She has been able to make progress with physical therapy.  She is weightbearing and compliant with brace usage   PMH/PSH/Family History/Social History/Meds/Allergies:    Past Medical History:  Diagnosis Date   Asthma    Hypertension    Urticaria    No past surgical history on file. Social History   Socioeconomic History   Marital status: Married    Spouse name: Not on file   Number of children: Not on file   Years of education: Not on file   Highest education level: Not on file  Occupational History   Not on file  Tobacco Use   Smoking status: Never   Smokeless tobacco: Never  Vaping Use   Vaping status: Never Used  Substance and Sexual Activity   Alcohol use: Not Currently   Drug use: Never   Sexual activity: Not on file  Other Topics Concern   Not on file  Social History Narrative   Not on file   Social Drivers of Health   Financial Resource Strain: Not on file  Food Insecurity: Not on file  Transportation Needs: Not on file  Physical Activity: Not on file  Stress: Not on file  Social Connections: Not on file   Family History  Problem Relation Age of Onset   Allergic rhinitis Father    Asthma Father    Asthma Brother    Allergies  Allergen Reactions   Doxycycline Other (See Comments)   Iron Other (See Comments)   Minocycline Other (See Comments)   Other Other (See Comments)   Current Outpatient Medications  Medication Sig Dispense Refill   aspirin  EC 325 MG tablet Take 1 tablet (325 mg total) by mouth daily. 14 tablet 0   Carbinoxamine  Maleate 4 MG TABS TAKE ONE AND ONE-HALF TABLETS BY MOUTH TWICE DAILY AS NEEDED for allergies 270 tablet 1   Cholecalciferol (VITAMIN D) 125 MCG (5000 UT) CAPS Take 1 capsule by mouth daily  at 12 noon.     EPINEPHrine  0.3 mg/0.3 mL IJ SOAJ injection Inject 0.3 mg into the muscle as needed for anaphylaxis. 1 each 2   Fluticasone  Furoate (ARNUITY ELLIPTA ) 100 MCG/ACT AEPB Inhale 1 puff into the lungs daily. Rinse mouth after each use. 30 each 5   levalbuterol  (XOPENEX  HFA) 45 MCG/ACT inhaler Inhale 2 puffs into the lungs every 4 (four) hours as needed for wheezing or shortness of breath (coughing). 15 g 1   montelukast  (SINGULAIR ) 10 MG tablet Take 1 tablet (10 mg total) by mouth at bedtime. 30 tablet 5   naproxen  (NAPROSYN ) 500 MG tablet Take 1 tablet (500 mg total) by mouth 2 (two) times daily. 30 tablet 0   olmesartan (BENICAR) 40 MG tablet Take 40 mg by mouth daily.     Olopatadine  HCl 0.6 % SOLN Place 1-2 sprays into both nostrils 2 (two) times daily as needed (drainage). 30.5 g 5   Olopatadine -Mometasone ([name] ) 665-25 MCG/ACT SUSP Place 1-2 sprays into the nose in the morning and at bedtime. 29 g 5   oxyCODONE  (ROXICODONE ) 5 MG immediate release tablet Take 1 tablet (5 mg total) by mouth  every 4 (four) hours as needed for severe pain (pain score 7-10) or breakthrough pain. 10 tablet 0   No current facility-administered medications for this visit.   No results found.  Review of Systems:   A ROS was performed including pertinent positives and negatives as documented in the HPI.   Musculoskeletal Exam:    There were no vitals taken for this visit.  Right knee incisions are well-appearing without erythema or drainage.  Distal neurosensory exam is intact range of motion is from 0 to 90 degrees with trace effusion  Imaging:      I personally reviewed and interpreted the radiographs.   Assessment:   2 weeks status post right knee medial meniscal repair overall doing well.  This time she will continue to work on full weightbearing and she may utilize her brace in an unlocked position.  I will plan to see her back in 4 weeks for reassessment  Plan :    - Return to  clinic 4 weeks for reassessment      I personally saw and evaluated the patient, and participated in the management and treatment plan.  Elspeth Parker, MD Attending Physician, Orthopedic Surgery  This document was dictated using Dragon voice recognition software. A reasonable attempt at proof reading has been made to minimize errors.

## 2024-03-29 ENCOUNTER — Encounter (HOSPITAL_BASED_OUTPATIENT_CLINIC_OR_DEPARTMENT_OTHER): Payer: Self-pay | Admitting: Physical Therapy

## 2024-03-29 ENCOUNTER — Encounter: Payer: Self-pay | Admitting: Radiology

## 2024-03-29 ENCOUNTER — Ambulatory Visit (HOSPITAL_BASED_OUTPATIENT_CLINIC_OR_DEPARTMENT_OTHER): Payer: Self-pay | Attending: Sports Medicine | Admitting: Physical Therapy

## 2024-03-29 ENCOUNTER — Ambulatory Visit

## 2024-03-29 DIAGNOSIS — M25561 Pain in right knee: Secondary | ICD-10-CM | POA: Insufficient documentation

## 2024-03-29 DIAGNOSIS — R2689 Other abnormalities of gait and mobility: Secondary | ICD-10-CM | POA: Insufficient documentation

## 2024-03-29 DIAGNOSIS — J309 Allergic rhinitis, unspecified: Secondary | ICD-10-CM

## 2024-03-29 DIAGNOSIS — M6281 Muscle weakness (generalized): Secondary | ICD-10-CM | POA: Insufficient documentation

## 2024-03-29 NOTE — Therapy (Signed)
 OUTPATIENT PHYSICAL THERAPY LOWER EXTREMITY EVALUATION   Patient Name: Cindy [name] MRN: [id number] DOB:1971-03-09, 53 y.o., female Today's Date: 03/29/2024  END OF SESSION:  PT End of Session - 03/29/24 0938     Visit Number 3    Number of Visits 16    Date for Recertification  05/26/24    Authorization Type [place]    PT Start Time [phone]    PT Stop Time 0926    PT Time Calculation (min) 39 min    Activity Tolerance Patient tolerated treatment well    Behavior During Therapy [facility] for tasks assessed/performed            Past Medical History:  Diagnosis Date   Asthma    Hypertension    Urticaria    History reviewed. No pertinent surgical history. Patient Active Problem List   Diagnosis Date Noted   Seasonal and perennial allergic rhinitis 06/30/2019   Reactive airway disease 06/30/2019    PCP: Espinoza, Alejandra, DO  REFERRING PROVIDER: Genelle Standing, MD  REFERRING DIAG:  Acute medial meniscus tear of right knee, initial encounter [D16.758J]   THERAPY DIAG:  No diagnosis found.  Rationale for Evaluation and Treatment: Rehabilitation  ONSET DATE: 03/11/24  SUBJECTIVE:   SUBJECTIVE STATEMENT:  The red spots have cleared. She has seen the MD He is happy with her progress.   Pt has a long history of  knee pain. She has had PT in the past, which did not resolve the pain. She has extensive damage in her knee per her most recent MRI. She received a meniscal repair on 03/11/24. 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   PERTINENT HISTORY: Asthma Hypertension Urticaria PAIN:  Are you having pain? Yes: NPRS scale: 5/10 with WB.  Pain location: R knee at inferior patella.  Pain description: Dull ache, pulling when standing.  Aggravating factors: Standing, walking.  Relieving factors: Sitting, resting, medicine.   PRECAUTIONS: Knee, meniscal repair   RED FLAGS: None   WEIGHT BEARING RESTRICTIONS: Yes WBAT with AD  FALLS:  Has  patient fallen in last 6 months? No  LIVING ENVIRONMENT: 11-12 stairs  OCCUPATION: Nurse  PLOF: Independent  PATIENT GOALS: decreased pain, increased confidence while walking   NEXT MD VISIT: 03/25/24  OBJECTIVE:  Note: Objective measures were completed at Evaluation unless otherwise noted.  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 No evidence of acute fracture or dislocation. No focal bone lesion or bone destruction. No significant effusions. Soft tissues are unremarkable.  PATIENT SURVEYS:  LEFS: 27/80 33.75%   COGNITION: Overall cognitive status: Within functional limits for tasks assessed     SENSATION: WFL  POSTURE: No Significant postural limitations  PALPATION: Tenderness at surgical site.   LOWER EXTREMITY ROM:  Passive ROM Right eval Left eval  Hip flexion    Hip extension    Hip abduction    Hip adduction    Hip internal rotation    Hip external rotation    Knee flexion 78 degrees  0  Knee extension    Ankle dorsiflexion    Ankle plantarflexion    Ankle inversion    Ankle eversion     (Blank rows = not tested)  LOWER EXTREMITY MMT:  MMT Right eval Left eval  Hip flexion    Hip extension    Hip abduction    Hip adduction  Hip internal rotation    Hip external rotation    Knee flexion    Knee extension    Ankle dorsiflexion    Ankle plantarflexion    Ankle inversion    Ankle eversion     (Blank rows = not tested)  LOWER EXTREMITY SPECIAL TESTS:    FUNCTIONAL TESTS:   GAIT: Distance walked: 56ft Assistive device utilized: 1x axillary crutch  Level of assistance: Complete Independence Comments: Walks with an antalgic gait and WBAT.                                                                                                                                 TREATMENT  DATE: 11/3  Manual: Trigger point release to quad Hamstring release  Extension stretch with distraction   There-ex:  PROM into flexion  Quad sets 3x12  SLR with brace on 3x10  SL SLR 3x10   Neuro-re-ed:  Heel/toe rock 3x10      03/22/24: Quad sets with grade 1 contraction Russian: Duty cycle 50%, cycle time 10/10, ramp 5 sec, treatment time 10 min Heel slides to 90 degrees with ease.  SLR with brace with focus on good quad contraction prior to movement SL abduction with brace on.     03/15/24  Changed dressings SLR with brace Heel slides to tolerance  Quad sets   PATIENT EDUCATION:  Education details: Educated pt on anatomy and physiology of current symptoms, LEFS, diagnosis, prognosis, HEP,  and POC. Person educated: Patient Education method: Explanation, Demonstration, Verbal cues, and Handouts Education comprehension: verbalized understanding and returned demonstration  HOME EXERCISE PROGRAM: Access Code: [email or web address] URL: [email or web address] Date: 03/15/2024 Prepared by: Rojean Batten  Exercises - Supine Quad Set  - 1-2 x daily - 7 x weekly - 2-3 sets - 10 reps - Supine Active Straight Leg Raise  - 1-2 x daily - 7 x weekly - 2-3 sets - 10 reps - Supine Heel Slide with Strap  - 1-2 x daily - 7 x weekly - 2-3 sets - 10 reps  ASSESSMENT:  CLINICAL IMPRESSION:  The patient came in today with no assistive device but a moderate antalgic gait.  She is walking with the brace locked.  She did well with her SLR today.  She will likely be able to unlock her brace next visit.  Overall she is doing very well.  Her range of motion was measured at total arc of 3-110.  We focused on extension stretching today.  We added in standing weight shifts.  She had no significant pain.  She showed good control with straight leg raise and side-lying straight leg raise.  Therapy will continue to progress as tolerated.  Eval: Patient is a 53 y.o. F who was seen today for  physical therapy evaluation and treatment for S/P R meniscal repair . No signs of infection or DVT's today. Surgical site looks clean. Applied InteguDerm and guaze over stitches. Provided pt with  extra's. Pt tolerated session well today. She was limited by pain and had a few lower back spasms. Patient will benefit from skilled PT to address below impairments, limitations and improve overall function.   ACTIVITY LIMITATIONS: bending, lifting, carry, locomotion, cleaning, community activity, driving, and or occupation  PERSONAL FACTORS: Asthma, Hypertension, Urticaria. Hx of ongoing knee pain are also affecting patient's functional outcome.  REHAB POTENTIAL: Good  CLINICAL DECISION MAKING: Stable/uncomplicated  EVALUATION COMPLEXITY: Low    GOALS: Short term PT Goals Target date: 03/29/24 Pt will be I and compliant with HEP. Baseline:  Goal status: New Pt will decrease pain by 25% overall Baseline: Goal status: New  Long term PT goals Target date: 05/26/24 Pt will improve ROM to [facility] to improve functional mobility Baseline: Goal status: New Pt will improve  hip/knee strength to at least 5-/5 MMT to improve functional strength Baseline: Goal status: New Pt will improve LEFS by 9 points to show improved function Baseline:27 Goal status: New Pt will reduce pain by overall 50% overall with usual activity Baseline: Goal status: New Pt will reduce pain to overall less than 2-3/10 with usual activity and work activity. Baseline: Goal status: New Pt will be able to ambulate community distances at least 1000 ft WNL gait pattern without complaints Baseline: Goal status: New  PLAN: PT FREQUENCY: 1-2 times per week   PT DURATION: 8-12 weeks  PLANNED INTERVENTIONS (unless contraindicated): aquatic PT, Canalith repositioning, cryotherapy, Electrical stimulation, Iontophoresis with 4 mg/ml dexamethasome, Moist heat, traction, Ultrasound, gait training, Therapeutic exercise, balance  training, neuromuscular re-education, patient/family education, prosthetic training, manual techniques, passive ROM, dry needling, taping, vasopnuematic device, vestibular, spinal manipulations, joint manipulations  PLAN FOR NEXT SESSION: Progress per protocol.      Alm JINNY Don, PT 03/29/2024, 9:39 AM

## 2024-04-05 ENCOUNTER — Encounter (HOSPITAL_BASED_OUTPATIENT_CLINIC_OR_DEPARTMENT_OTHER): Payer: Self-pay | Admitting: Physical Therapy

## 2024-04-05 ENCOUNTER — Ambulatory Visit (HOSPITAL_BASED_OUTPATIENT_CLINIC_OR_DEPARTMENT_OTHER): Payer: Self-pay | Admitting: Physical Therapy

## 2024-04-05 DIAGNOSIS — R2689 Other abnormalities of gait and mobility: Secondary | ICD-10-CM

## 2024-04-05 DIAGNOSIS — M6281 Muscle weakness (generalized): Secondary | ICD-10-CM

## 2024-04-05 DIAGNOSIS — M25561 Pain in right knee: Secondary | ICD-10-CM | POA: Diagnosis not present

## 2024-04-05 NOTE — Therapy (Signed)
 OUTPATIENT PHYSICAL THERAPY LOWER EXTREMITY EVALUATION   Patient Name: Cindy [name] MRN: [id number] DOB:1970-06-30, 53 y.o., female Today's Date: 04/05/2024  END OF SESSION:  PT End of Session - 04/05/24 1232     Visit Number 4    Number of Visits 16    Date for Recertification  05/26/24    Authorization Type [employer]    Progress Note Due on Visit 10    PT Start Time 1145    PT Stop Time 1228    PT Time Calculation (min) 43 min    Equipment Utilized During Treatment Gait belt    Activity Tolerance Patient tolerated treatment well    Behavior During Therapy WFL for tasks assessed/performed             Past Medical History:  Diagnosis Date   Asthma    Hypertension    Urticaria    History reviewed. No pertinent surgical history. Patient Active Problem List   Diagnosis Date Noted   Seasonal and perennial allergic rhinitis 06/30/2019   Reactive airway disease 06/30/2019    PCP: Espinoza, Alejandra, DO  REFERRING PROVIDER: Genelle Standing, MD  REFERRING DIAG:  Acute medial meniscus tear of right knee, initial encounter [D16.758J]   THERAPY DIAG:  Acute pain of right knee  Muscle weakness (generalized)  Other abnormalities of gait and mobility  Rationale for Evaluation and Treatment: Rehabilitation  ONSET DATE: 03/11/24  SUBJECTIVE:   SUBJECTIVE STATEMENT:  The red spots have cleared. She has seen the MD He is happy with her progress.   Pt has a long history of  knee pain. She has had PT in the past, which did not resolve the pain. She has extensive damage in her knee per her most recent MRI. She received a meniscal repair on 03/11/24. 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   PERTINENT HISTORY: Asthma Hypertension Urticaria PAIN:  Are you having pain? Yes: NPRS scale: 5/10 with WB.  Pain location: R knee at inferior patella.  Pain description: Dull ache, pulling when standing.  Aggravating factors: Standing, walking.   Relieving factors: Sitting, resting, medicine.   PRECAUTIONS: Knee, meniscal repair   RED FLAGS: None   WEIGHT BEARING RESTRICTIONS: Yes WBAT with AD  FALLS:  Has patient fallen in last 6 months? No  LIVING ENVIRONMENT: 11-12 stairs  OCCUPATION: Nurse  PLOF: Independent  PATIENT GOALS: decreased pain, increased confidence while walking   NEXT MD VISIT: 03/25/24  OBJECTIVE:  Note: Objective measures were completed at Evaluation unless otherwise noted.  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 No evidence of acute fracture or dislocation. No focal bone lesion or bone destruction. No significant effusions. Soft tissues are unremarkable.  PATIENT SURVEYS:  LEFS: 27/80 33.75%   COGNITION: Overall cognitive status: Within functional limits for tasks assessed     SENSATION: WFL  POSTURE: No Significant postural limitations  PALPATION: Tenderness at surgical site.   LOWER EXTREMITY ROM:  Passive ROM Right eval Left eval  Hip flexion    Hip extension    Hip abduction    Hip adduction    Hip internal rotation    Hip external rotation    Knee flexion 78 degrees  0  Knee extension    Ankle dorsiflexion    Ankle plantarflexion    Ankle inversion    Ankle eversion     (Blank rows =  not tested)  LOWER EXTREMITY MMT:  MMT Right eval Left eval  Hip flexion    Hip extension    Hip abduction    Hip adduction    Hip internal rotation    Hip external rotation    Knee flexion    Knee extension    Ankle dorsiflexion    Ankle plantarflexion    Ankle inversion    Ankle eversion     (Blank rows = not tested)  LOWER EXTREMITY SPECIAL TESTS:    FUNCTIONAL TESTS:   GAIT: Distance walked: 25ft Assistive device utilized: 1x axillary crutch  Level of assistance: Complete Independence Comments: Walks with an antalgic gait  and WBAT.                                                                                                                                 TREATMENT DATE: 11/10 There-ex:  Heel slides into flexion  SL SLR 2x15  Standing hip abduction and extension 2x15   Gait:  Weight shifts into fwd staggered stance and lateral without brace 1.5 laps around clinic with brace unlocked to 90 degrees with focus on knee flexion and heel to toe gait.   Neuro-re-ed:  Heel/toe rock 3x10  Standing on airex pad marching for 1 min with brace on. TKE with blue TB resistance.   11/3  Manual: Trigger point release to quad Hamstring release  Extension stretch with distraction   There-ex:  PROM into flexion  Quad sets 3x12  SLR with brace on 3x10  SL SLR 3x10   Neuro-re-ed:  Heel/toe rock 3x10      03/22/24: Quad sets with grade 1 contraction Russian: Duty cycle 50%, cycle time 10/10, ramp 5 sec, treatment time 10 min Heel slides to 90 degrees with ease.  SLR with brace with focus on good quad contraction prior to movement SL abduction with brace on.     03/15/24  Changed dressings SLR with brace Heel slides to tolerance  Quad sets   PATIENT EDUCATION:  Education details: Educated pt on anatomy and physiology of current symptoms, LEFS, diagnosis, prognosis, HEP,  and POC. Person educated: Patient Education method: Explanation, Demonstration, Verbal cues, and Handouts Education comprehension: verbalized understanding and returned demonstration  HOME EXERCISE PROGRAM: Access Code: [email or web address] URL: [email or web address] Date: 03/15/2024 Prepared by: Rojean Batten  Exercises - Supine Quad Set  - 1-2 x daily - 7 x weekly - 2-3 sets - 10 reps - Supine Active Straight Leg Raise  - 1-2 x daily - 7 x weekly - 2-3 sets - 10 reps - Supine Heel Slide with Strap  - 1-2 x daily - 7 x weekly - 2-3 sets - 10 reps  ASSESSMENT:  CLINICAL IMPRESSION:  Pt arrives with brace locked,  but has been actively unlocking it and walking at home with and without it. She She demonstrates a SLR with no lag. Challenged her with gait today with focus on heel  to toe and knee flexion with brace unlocked. She requires cues due to tendency to lock out knee. She was also able to progress with strengthening with no adverse effects today. Therapy will continue to progress as tolerated.  Eval: Patient is a 53 y.o. F who was seen today for physical therapy evaluation and treatment for S/P R meniscal repair . No signs of infection or DVT's today. Surgical site looks clean. Applied InteguDerm and guaze over stitches. Provided pt with extra's. Pt tolerated session well today. She was limited by pain and had a few lower back spasms. Patient will benefit from skilled PT to address below impairments, limitations and improve overall function.   ACTIVITY LIMITATIONS: bending, lifting, carry, locomotion, cleaning, community activity, driving, and or occupation  PERSONAL FACTORS: Asthma, Hypertension, Urticaria. Hx of ongoing knee pain are also affecting patient's functional outcome.  REHAB POTENTIAL: Good  CLINICAL DECISION MAKING: Stable/uncomplicated  EVALUATION COMPLEXITY: Low    GOALS: Short term PT Goals Target date: 03/29/24 Pt will be I and compliant with HEP. Baseline:  Goal status: New Pt will decrease pain by 25% overall Baseline: Goal status: New  Long term PT goals Target date: 05/26/24 Pt will improve ROM to [facility] to improve functional mobility Baseline: Goal status: New Pt will improve  hip/knee strength to at least 5-/5 MMT to improve functional strength Baseline: Goal status: New Pt will improve LEFS by 9 points to show improved function Baseline:27 Goal status: New Pt will reduce pain by overall 50% overall with usual activity Baseline: Goal status: New Pt will reduce pain to overall less than 2-3/10 with usual activity and work activity. Baseline: Goal status: New Pt  will be able to ambulate community distances at least 1000 ft WNL gait pattern without complaints Baseline: Goal status: New  PLAN: PT FREQUENCY: 1-2 times per week   PT DURATION: 8-12 weeks  PLANNED INTERVENTIONS (unless contraindicated): aquatic PT, Canalith repositioning, cryotherapy, Electrical stimulation, Iontophoresis with 4 mg/ml dexamethasome, Moist heat, traction, Ultrasound, gait training, Therapeutic exercise, balance training, neuromuscular re-education, patient/family education, prosthetic training, manual techniques, passive ROM, dry needling, taping, vasopnuematic device, vestibular, spinal manipulations, joint manipulations  PLAN FOR NEXT SESSION: Progress per protocol.      Rojean JONELLE Batten, PT 04/05/2024, 12:33 PM

## 2024-04-06 NOTE — Progress Notes (Unsigned)
 Follow Up Note  RE: Cindy [name] MRN: [id number] DOB: [birth year] Date of Office Visit: 04/07/2024  Referring provider: Espinoza, Alejandra, DO Primary care provider: Espinoza, Alejandra, DO  Chief Complaint: No chief complaint on file.  History of Present Illness: I had the pleasure of seeing Cindy [name] for a follow up visit at the [facility] on 04/07/2024. She is a 53 y.o. female, who is being followed for allergic rhinitis on AIT, asthma, heartburn. Her previous allergy office visit was on 02/06/2024 with Dr. Luke. Today is a regular follow up visit.  Discussed the use of AI scribe software for clinical note transcription with the patient, who gave verbal consent to proceed.  History of Present Illness            ***  Assessment and Plan: Cindy [name] is a 53 y.o. female with: Seasonal allergic rhinitis due to pollen Allergic rhinitis due to animal dander Allergic rhinitis due to dust mite Allergic rhinitis due to mold Allergy to cockroaches Past history - Mood disorder with Zyrtec, declined Singulair .  2 cats at home. 2021 skin testing positive to dust mites and cats, trees, mold, cockroach. Started AIT on 12/22/2019 (TCDM & MCR). Saw ENT and normal MRI.  Interim history - was doing well and able to come off all medications until a few months ago. Persistent nasal congestion and postnasal drip causing cough and throat irritation. Continue allergy injections - every 4 weeks.  Make sure you carry your Epipen  on the days of your injections.  Continue environmental control measures May use carbinoxamine  4mg  1 to 1.5 tablet 1-2 times a day as needed.  Start [name]  (olopatadine  + mometasone nasal spray combination) 1-2 sprays per nostril twice a day. Sample given. This replaces your other nasal sprays. If this works well for you, then have pharmacy ship the medication to your home - prescription already sent in.  Nasal saline spray (i.e., Simply Saline) or nasal  saline lavage (i.e., NeilMed) is recommended as needed and prior to medicated nasal sprays. May take Singulair  (montelukast ) 10mg  daily at night as needed.  Cautioned that in some children/adults can experience behavioral changes including hyperactivity, agitation, depression, sleep disturbances and suicidal ideations. These side effects are rare, but if you notice them you should notify me and discontinue Singulair  (montelukast ).   Not well controlled mild persistent asthma Past history - Albuterol causes palpitations.  Used to be on Symbicort for a short while in the past. 2021 spirometry showed some restriction with 6% improvement in FEV1 post bronchodilator treatment.  She clinically felt better and liked Xopenex  as it did not cause palpitation. Alvesco too expensive. Covid-19 in Feb 2023. Stopped Arnuity in July 2023.  Interim history - Exacerbated nocturnal symptoms with cough and wheezing. Improved with prednisone but recurred.  Today's spirometry normal. Daily controller medication(s): Start Arnuity 100mcg 1 puff once a day and rinse mouth after each use.  May use levoalbuterol rescue inhaler 2 puffs every 4 to 6 hours as needed for shortness of breath, chest tightness, coughing, and wheezing.  Monitor frequency of use - if you need to use it more than twice per week on a consistent basis let us  know.    Possible Heartburn See handout for lifestyle and dietary modifications. May take famotidine or over the counter omeprazole once a day as needed.    Will discuss at next visit about reintroducing fish oil - I don't think that's what triggered above episode.  Assessment and Plan  No follow-ups on file.  No orders of the defined types were placed in this encounter.  Lab Orders  No laboratory test(s) ordered today    Diagnostics: Spirometry:  Tracings reviewed. Her effort: {Blank single:19197::Good reproducible efforts.,It was hard to get consistent efforts and  there is a question as to whether this reflects a maximal maneuver.,Poor effort, data can not be interpreted.} FVC: ***L FEV1: ***L, ***% predicted FEV1/FVC ratio: ***% Interpretation: {Blank single:19197::Spirometry consistent with mild obstructive disease,Spirometry consistent with moderate obstructive disease,Spirometry consistent with severe obstructive disease,Spirometry consistent with possible restrictive disease,Spirometry consistent with mixed obstructive and restrictive disease,Spirometry uninterpretable due to technique,Spirometry consistent with normal pattern,No overt abnormalities noted given today's efforts}.  Please see scanned spirometry results for details.  Skin Testing: {Blank single:19197::Select foods,Environmental allergy panel,Environmental allergy panel and select foods,Food allergy panel,None,Deferred due to recent antihistamines use}. *** Results discussed with patient/family.   Medication List:  Current Outpatient Medications  Medication Sig Dispense Refill   aspirin  EC 325 MG tablet Take 1 tablet (325 mg total) by mouth daily. 14 tablet 0   Carbinoxamine  Maleate 4 MG TABS TAKE ONE AND ONE-HALF TABLETS BY MOUTH TWICE DAILY AS NEEDED for allergies 270 tablet 1   Cholecalciferol (VITAMIN D) 125 MCG (5000 UT) CAPS Take 1 capsule by mouth daily at 12 noon.     EPINEPHrine  0.3 mg/0.3 mL IJ SOAJ injection Inject 0.3 mg into the muscle as needed for anaphylaxis. 1 each 2   Fluticasone  Furoate (ARNUITY ELLIPTA ) 100 MCG/ACT AEPB Inhale 1 puff into the lungs daily. Rinse mouth after each use. 30 each 5   levalbuterol  (XOPENEX  HFA) 45 MCG/ACT inhaler Inhale 2 puffs into the lungs every 4 (four) hours as needed for wheezing or shortness of breath (coughing). 15 g 1   montelukast  (SINGULAIR ) 10 MG tablet Take 1 tablet (10 mg total) by mouth at bedtime. 30 tablet 5   naproxen  (NAPROSYN ) 500 MG tablet Take 1 tablet (500 mg total) by mouth 2 (two)  times daily. 30 tablet 0   olmesartan (BENICAR) 40 MG tablet Take 40 mg by mouth daily.     Olopatadine  HCl 0.6 % SOLN Place 1-2 sprays into both nostrils 2 (two) times daily as needed (drainage). 30.5 g 5   Olopatadine -Mometasone ([name] ) 665-25 MCG/ACT SUSP Place 1-2 sprays into the nose in the morning and at bedtime. 29 g 5   oxyCODONE  (ROXICODONE ) 5 MG immediate release tablet Take 1 tablet (5 mg total) by mouth every 4 (four) hours as needed for severe pain (pain score 7-10) or breakthrough pain. 10 tablet 0   No current facility-administered medications for this visit.   Allergies: Allergies  Allergen Reactions   Doxycycline Other (See Comments)   Iron Other (See Comments)   Minocycline Other (See Comments)   Other Other (See Comments)   I reviewed her past medical history, social history, family history, and environmental history and no significant changes have been reported from her previous visit.  Review of Systems  Constitutional:  Negative for appetite change, chills, fever and unexpected weight change.  HENT:  Positive for congestion and postnasal drip. Negative for rhinorrhea.   Eyes:  Negative for itching.  Respiratory:  Positive for cough and wheezing. Negative for chest tightness and shortness of breath.   Cardiovascular:  Negative for chest pain.  Gastrointestinal:  Negative for abdominal pain and nausea.  Genitourinary:  Negative for difficulty urinating.  Skin:  Negative for rash.  Allergic/Immunologic: Positive for environmental allergies.  Neurological:  Negative for  headaches.    Objective: There were no vitals taken for this visit. There is no height or weight on file to calculate BMI. Physical Exam Vitals and nursing note reviewed.  Constitutional:      Appearance: Normal appearance. She is well-developed.  HENT:     Head: Normocephalic and atraumatic.     Right Ear: Tympanic membrane and external ear normal.     Left Ear: Tympanic membrane and  external ear normal.     Nose: Nose normal.     Mouth/Throat:     Mouth: Mucous membranes are moist.     Pharynx: Oropharynx is clear.  Eyes:     Conjunctiva/sclera: Conjunctivae normal.  Cardiovascular:     Rate and Rhythm: Normal rate and regular rhythm.     Heart sounds: Normal heart sounds. No murmur heard.    No friction rub. No gallop.  Pulmonary:     Effort: Pulmonary effort is normal.     Breath sounds: Normal breath sounds. No wheezing or rales.  Musculoskeletal:     Cervical back: Neck supple.  Skin:    General: Skin is warm.     Findings: No rash.  Neurological:     Mental Status: She is alert and oriented to person, place, and time.  Psychiatric:        Behavior: Behavior normal.    Previous notes and tests were reviewed. The plan was reviewed with the patient/family, and all questions/concerned were addressed.  It was my pleasure to see Cindy [name] today and participate in her care. Please feel free to contact me with any questions or concerns.  Sincerely,  Orlan Cramp, DO Allergy & Immunology  [facility]  [place] office: [phone] [facility] office: [phone]

## 2024-04-07 ENCOUNTER — Other Ambulatory Visit: Payer: Self-pay

## 2024-04-07 ENCOUNTER — Ambulatory Visit: Admitting: Allergy

## 2024-04-07 ENCOUNTER — Encounter: Payer: Self-pay | Admitting: Allergy

## 2024-04-07 VITALS — BP 110/70 | HR 70 | Temp 98.6°F | Ht 61.0 in | Wt 134.0 lb

## 2024-04-07 DIAGNOSIS — Z91038 Other insect allergy status: Secondary | ICD-10-CM

## 2024-04-07 DIAGNOSIS — J301 Allergic rhinitis due to pollen: Secondary | ICD-10-CM | POA: Diagnosis not present

## 2024-04-07 DIAGNOSIS — J3089 Other allergic rhinitis: Secondary | ICD-10-CM | POA: Diagnosis not present

## 2024-04-07 DIAGNOSIS — J4520 Mild intermittent asthma, uncomplicated: Secondary | ICD-10-CM | POA: Diagnosis not present

## 2024-04-07 DIAGNOSIS — J3081 Allergic rhinitis due to animal (cat) (dog) hair and dander: Secondary | ICD-10-CM | POA: Diagnosis not present

## 2024-04-07 DIAGNOSIS — R12 Heartburn: Secondary | ICD-10-CM

## 2024-04-07 MED ORDER — EPINEPHRINE 0.3 MG/0.3ML IJ SOAJ: 0.3000 mg | INTRAMUSCULAR | 1 refills | Status: AC | PRN

## 2024-04-07 NOTE — Patient Instructions (Addendum)
 Allergic rhinitis Past skin testing positive to dust mites and cats, trees, mold, cockroach. Continue allergy injections - every 4 weeks.  Make sure you carry your Epipen  on the days of your injections.  Continue environmental control measures May use carbinoxamine  4mg  1 to 1.5 tablet 1-2 times a day as needed.  [name]  (olopatadine  + mometasone nasal spray combination) 1-2 sprays per nostril twice a day.  Nasal saline spray (i.e., Simply Saline) or nasal saline lavage (i.e., NeilMed) is recommended as needed and prior to medicated nasal sprays. May take Singulair  (montelukast ) 10mg  daily at night as needed.    Reactive airway disease Today's spirometry was normal.  May use levoalbuterol rescue inhaler 2 puffs every 4 to 6 hours as needed for shortness of breath, chest tightness, coughing, and wheezing.  Monitor frequency of use - if you need to use it more than twice per week on a consistent basis let us  know.  Breathing control goals:  Full participation in all desired activities (may need albuterol before activity) Albuterol use two times or less a week on average (not counting use with activity) Cough interfering with sleep two times or less a month Oral steroids no more than once a year No hospitalizations   Heartburn Continue lifestyle and dietary modifications. May take famotidine or over the counter omeprazole once a day as needed.   Follow up in 6 months or sooner if needed.

## 2024-04-09 ENCOUNTER — Encounter: Payer: Self-pay | Admitting: Allergy

## 2024-04-12 ENCOUNTER — Encounter (HOSPITAL_BASED_OUTPATIENT_CLINIC_OR_DEPARTMENT_OTHER): Payer: Self-pay | Admitting: Physical Therapy

## 2024-04-12 ENCOUNTER — Ambulatory Visit (HOSPITAL_BASED_OUTPATIENT_CLINIC_OR_DEPARTMENT_OTHER): Payer: Self-pay | Admitting: Physical Therapy

## 2024-04-12 DIAGNOSIS — M25561 Pain in right knee: Secondary | ICD-10-CM | POA: Diagnosis not present

## 2024-04-12 DIAGNOSIS — M6281 Muscle weakness (generalized): Secondary | ICD-10-CM

## 2024-04-12 DIAGNOSIS — R2689 Other abnormalities of gait and mobility: Secondary | ICD-10-CM

## 2024-04-12 NOTE — Therapy (Signed)
 OUTPATIENT PHYSICAL THERAPY LOWER EXTREMITY EVALUATION   Patient Name: George Alcantar MRN: [id number] DOB:04-08-1971, 53 y.o., female Today's Date: 04/12/2024  END OF SESSION:  PT End of Session - 04/12/24 1242     Visit Number 5    Number of Visits 16    Date for Recertification  05/26/24    Authorization Type [employer]    PT Start Time 1235   therapy late   PT Stop Time 1315    PT Time Calculation (min) 40 min    Activity Tolerance Patient tolerated treatment well    Behavior During Therapy [facility] for tasks assessed/performed             Past Medical History:  Diagnosis Date   Asthma    Hypertension    Urticaria    Past Surgical History:  Procedure Laterality Date   KNEE SURGERY Right 03/11/2024   meniscus repair   Patient Active Problem List   Diagnosis Date Noted   Seasonal and perennial allergic rhinitis 06/30/2019   Reactive airway disease 06/30/2019    PCP: Espinoza, Alejandra, DO  REFERRING PROVIDER: Genelle Standing, MD  REFERRING DIAG:  Acute medial meniscus tear of right knee, initial encounter [D16.758J]   THERAPY DIAG:  Acute pain of right knee  Muscle weakness (generalized)  Other abnormalities of gait and mobility  Rationale for Evaluation and Treatment: Rehabilitation  ONSET DATE: 03/11/24  SUBJECTIVE:   SUBJECTIVE STATEMENT:  The red spots have cleared. She has seen the MD He is happy with her progress.   Pt has a long history of  knee pain. She has had PT in the past, which did not resolve the pain. She has extensive damage in her knee per her most recent MRI. She received a meniscal repair on 03/11/24. 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   PERTINENT HISTORY: Asthma Hypertension Urticaria PAIN:  Are you having pain? Yes: NPRS scale: 5/10 with WB.  Pain location: R knee at inferior patella.  Pain description: Dull ache, pulling when standing.  Aggravating factors: Standing, walking.  Relieving factors:  Sitting, resting, medicine.   PRECAUTIONS: Knee, meniscal repair   RED FLAGS: None   WEIGHT BEARING RESTRICTIONS: Yes WBAT with AD  FALLS:  Has patient fallen in last 6 months? No  LIVING ENVIRONMENT: 11-12 stairs  OCCUPATION: Nurse  PLOF: Independent  PATIENT GOALS: decreased pain, increased confidence while walking   NEXT MD VISIT: 03/25/24  OBJECTIVE:  Note: Objective measures were completed at Evaluation unless otherwise noted.  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 No evidence of acute fracture or dislocation. No focal bone lesion or bone destruction. No significant effusions. Soft tissues are unremarkable.  PATIENT SURVEYS:  LEFS: 27/80 33.75%   COGNITION: Overall cognitive status: Within functional limits for tasks assessed     SENSATION: WFL  POSTURE: No Significant postural limitations  PALPATION: Tenderness at surgical site.   LOWER EXTREMITY ROM:  Passive ROM Right eval Left eval Right   Hip flexion     Hip extension     Hip abduction     Hip adduction     Hip internal rotation     Hip external rotation     Knee flexion 78 degrees  0 106  Knee extension   -3  Ankle dorsiflexion     Ankle plantarflexion     Ankle inversion  Ankle eversion      (Blank rows = not tested)  LOWER EXTREMITY MMT:  MMT Right eval Left eval  Hip flexion    Hip extension    Hip abduction    Hip adduction    Hip internal rotation    Hip external rotation    Knee flexion    Knee extension    Ankle dorsiflexion    Ankle plantarflexion    Ankle inversion    Ankle eversion     (Blank rows = not tested)  LOWER EXTREMITY SPECIAL TESTS:    FUNCTIONAL TESTS:   GAIT: Distance walked: 72ft Assistive device utilized: 1x axillary crutch  Level of assistance: Complete Independence Comments: Walks with an antalgic  gait and WBAT.                                                                                                                                 TREATMENT DATE: 11/17 There-ex:  PROM into flexion  Quad sets 3x12  SLR with brace on 3x10  SL SLR 3x10   Manual: Trigger point release to quad Hamstring release  Extension stretch with distraction  Grade II and III PA and AP mobilization   There-act:  Step up  3x10 2 inch   Lateral step up  3x10 2 inch   11/10 There-ex:  Heel slides into flexion  SL SLR 2x15  Standing hip abduction and extension 2x15   Gait:  Weight shifts into fwd staggered stance and lateral without brace 1.5 laps around clinic with brace unlocked to 90 degrees with focus on knee flexion and heel to toe gait.   Neuro-re-ed:  Heel/toe rock 3x10  Standing on airex pad marching for 1 min with brace on. TKE with blue TB resistance.   11/3  Manual: Trigger point release to quad Hamstring release  Extension stretch with distraction   There-ex:  PROM into flexion  Quad sets 3x12  SLR with brace on 3x10  SL SLR 3x10   Neuro-re-ed:  Heel/toe rock 3x10      03/22/24: Quad sets with grade 1 contraction Russian: Duty cycle 50%, cycle time 10/10, ramp 5 sec, treatment time 10 min Heel slides to 90 degrees with ease.  SLR with brace with focus on good quad contraction prior to movement SL abduction with brace on.     03/15/24  Changed dressings SLR with brace Heel slides to tolerance  Quad sets   PATIENT EDUCATION:  Education details: Educated pt on anatomy and physiology of current symptoms, LEFS, diagnosis, prognosis, HEP,  and POC. Person educated: Patient Education method: Explanation, Demonstration, Verbal cues, and Handouts Education comprehension: verbalized understanding and returned demonstration  HOME EXERCISE PROGRAM: Access Code: [email or web address] URL: [email or web address] Date: 03/15/2024 Prepared by: Rojean Batten  Exercises - Supine Quad Set  - 1-2 x daily - 7 x weekly - 2-3 sets - 10 reps - Supine Active Straight Leg Raise  - 1-2  x daily - 7 x weekly - 2-3 sets - 10 reps - Supine Heel Slide with Strap  - 1-2 x daily - 7 x weekly - 2-3 sets - 10 reps  ASSESSMENT:  CLINICAL IMPRESSION:  Pt arrives with brace locked, but has been actively unlocking it and walking at home with and without it. She She demonstrates a SLR with no lag. Challenged her with gait today with focus on heel to toe and knee flexion with brace unlocked. She requires cues due to tendency to lock out knee. She was also able to progress with strengthening with no adverse effects today. Therapy will continue to progress as tolerated.  Eval: Patient is a 53 y.o. F who was seen today for physical therapy evaluation and treatment for S/P R meniscal repair . No signs of infection or DVT's today. Surgical site looks clean. Applied InteguDerm and guaze over stitches. Provided pt with extra's. Pt tolerated session well today. She was limited by pain and had a few lower back spasms. Patient will benefit from skilled PT to address below impairments, limitations and improve overall function.   ACTIVITY LIMITATIONS: bending, lifting, carry, locomotion, cleaning, community activity, driving, and or occupation  PERSONAL FACTORS: Asthma, Hypertension, Urticaria. Hx of ongoing knee pain are also affecting patient's functional outcome.  REHAB POTENTIAL: Good  CLINICAL DECISION MAKING: Stable/uncomplicated  EVALUATION COMPLEXITY: Low    GOALS: Short term PT Goals Target date: 03/29/24 Pt will be I and compliant with HEP. Baseline:  Goal status: New Pt will decrease pain by 25% overall Baseline: Goal status: New  Long term PT goals Target date: 05/26/24 Pt will improve ROM to [facility] to improve functional mobility Baseline: Goal status: New Pt will improve  hip/knee strength to at least 5-/5 MMT to improve functional  strength Baseline: Goal status: New Pt will improve LEFS by 9 points to show improved function Baseline:27 Goal status: New Pt will reduce pain by overall 50% overall with usual activity Baseline: Goal status: New Pt will reduce pain to overall less than 2-3/10 with usual activity and work activity. Baseline: Goal status: New Pt will be able to ambulate community distances at least 1000 ft WNL gait pattern without complaints Baseline: Goal status: New  PLAN: PT FREQUENCY: 1-2 times per week   PT DURATION: 8-12 weeks  PLANNED INTERVENTIONS (unless contraindicated): aquatic PT, Canalith repositioning, cryotherapy, Electrical stimulation, Iontophoresis with 4 mg/ml dexamethasome, Moist heat, traction, Ultrasound, gait training, Therapeutic exercise, balance training, neuromuscular re-education, patient/family education, prosthetic training, manual techniques, passive ROM, dry needling, taping, vasopnuematic device, vestibular, spinal manipulations, joint manipulations  PLAN FOR NEXT SESSION: Progress per protocol.      Alm JINNY Don, PT 04/12/2024, 12:43 PM

## 2024-04-15 ENCOUNTER — Ambulatory Visit (HOSPITAL_BASED_OUTPATIENT_CLINIC_OR_DEPARTMENT_OTHER): Payer: Self-pay | Admitting: Physical Therapy

## 2024-04-15 DIAGNOSIS — M25561 Pain in right knee: Secondary | ICD-10-CM | POA: Diagnosis not present

## 2024-04-15 DIAGNOSIS — M6281 Muscle weakness (generalized): Secondary | ICD-10-CM

## 2024-04-15 DIAGNOSIS — R2689 Other abnormalities of gait and mobility: Secondary | ICD-10-CM

## 2024-04-15 NOTE — Therapy (Signed)
 OUTPATIENT PHYSICAL THERAPY LOWER EXTREMITY EVALUATION   Patient Name: Cindy [name] MRN: [id number] DOB:08-14-1970, 53 y.o., female Today's Date: 04/16/2024  END OF SESSION:  PT End of Session - 04/16/24 0807     Visit Number 6    Number of Visits 16    Date for Recertification  05/26/24    Authorization Type [employer]    PT Start Time 1145    PT Stop Time 1228    PT Time Calculation (min) 43 min    Activity Tolerance Patient tolerated treatment well    Behavior During Therapy [facility] for tasks assessed/performed              Past Medical History:  Diagnosis Date   Asthma    Hypertension    Urticaria    Past Surgical History:  Procedure Laterality Date   KNEE SURGERY Right 03/11/2024   meniscus repair   Patient Active Problem List   Diagnosis Date Noted   Seasonal and perennial allergic rhinitis 06/30/2019   Reactive airway disease 06/30/2019    PCP: Espinoza, Alejandra, DO  REFERRING PROVIDER: Genelle Standing, MD  REFERRING DIAG:  Acute medial meniscus tear of right knee, initial encounter [D16.758J]   THERAPY DIAG:  Acute pain of right knee  Muscle weakness (generalized)  Other abnormalities of gait and mobility  Rationale for Evaluation and Treatment: Rehabilitation  ONSET DATE: 03/11/24  SUBJECTIVE:   SUBJECTIVE STATEMENT:  The patient was a little sore the next day after last visit.   Pt has a long history of  knee pain. She has had PT in the past, which did not resolve the pain. She has extensive damage in her knee per her most recent MRI. She received a meniscal repair on 03/11/24. 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   PERTINENT HISTORY: Asthma Hypertension Urticaria PAIN:  Are you having pain? Yes: NPRS scale: 5/10 with WB.  Pain location: R knee at inferior patella.  Pain description: Dull ache, pulling when standing.  Aggravating factors: Standing, walking.  Relieving factors: Sitting, resting, medicine.    PRECAUTIONS: Knee, meniscal repair   RED FLAGS: None   WEIGHT BEARING RESTRICTIONS: Yes WBAT with AD  FALLS:  Has patient fallen in last 6 months? No  LIVING ENVIRONMENT: 11-12 stairs  OCCUPATION: Nurse  PLOF: Independent  PATIENT GOALS: decreased pain, increased confidence while walking   NEXT MD VISIT: 03/25/24  OBJECTIVE:  Note: Objective measures were completed at Evaluation unless otherwise noted.  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 No evidence of acute fracture or dislocation. No focal bone lesion or bone destruction. No significant effusions. Soft tissues are unremarkable.  PATIENT SURVEYS:  LEFS: 27/80 33.75%   COGNITION: Overall cognitive status: Within functional limits for tasks assessed     SENSATION: WFL  POSTURE: No Significant postural limitations  PALPATION: Tenderness at surgical site.   LOWER EXTREMITY ROM:  Passive ROM Right eval Left eval Right   Hip flexion     Hip extension     Hip abduction     Hip adduction     Hip internal rotation     Hip external rotation     Knee flexion 78 degrees  0 106  Knee extension   -3  Ankle dorsiflexion     Ankle plantarflexion     Ankle inversion     Ankle eversion      (  Blank rows = not tested)  LOWER EXTREMITY MMT:  MMT Right eval Left eval  Hip flexion    Hip extension    Hip abduction    Hip adduction    Hip internal rotation    Hip external rotation    Knee flexion    Knee extension    Ankle dorsiflexion    Ankle plantarflexion    Ankle inversion    Ankle eversion     (Blank rows = not tested)  LOWER EXTREMITY SPECIAL TESTS:    FUNCTIONAL TESTS:   GAIT: Distance walked: 64ft Assistive device utilized: 1x axillary crutch  Level of assistance: Complete Independence Comments: Walks with an antalgic gait and WBAT.                                                                                                                                  TREATMENT DATE: 11/20 Manual: Trigger point release to quad Hamstring release  Extension stretch with distraction  Grade II and III PA and AP mobilization   There-ex:  PROM into flexion  Quad sets x15 SLR with brace on 3x10  SL SLR 3x10 low RPE on 1st set so added 1 lb weight  SAQ 3x10 low RPE without weight so added 1 lb weight for last 2 sets   There-act:  Step up  3x10 2 inch   Lateral step up  3x10 2 inch     11/17 There-ex:  PROM into flexion  Quad sets 3x12  SLR with brace on 3x10  SL SLR 3x10   Manual: Trigger point release to quad Hamstring release  Extension stretch with distraction  Grade II and III PA and AP mobilization   There-act:  Step up  3x10 2 inch   Lateral step up  3x10 2 inch   11/10 There-ex:  Heel slides into flexion  SL SLR 2x15  Standing hip abduction and extension 2x15   Gait:  Weight shifts into fwd staggered stance and lateral without brace 1.5 laps around clinic with brace unlocked to 90 degrees with focus on knee flexion and heel to toe gait.   Neuro-re-ed:  Heel/toe rock 3x10  Standing on airex pad marching for 1 min with brace on. TKE with blue TB resistance.    PATIENT EDUCATION:  Education details: Educated pt on anatomy and physiology of current symptoms, LEFS, diagnosis, prognosis, HEP,  and POC. Person educated: Patient Education method: Explanation, Demonstration, Verbal cues, and Handouts Education comprehension: verbalized understanding and returned demonstration  HOME EXERCISE PROGRAM: Access Code: [email or web address] URL: [email or web address] Date: 03/15/2024 Prepared by: Rojean Batten  Exercises - Supine Quad Set  - 1-2 x daily - 7 x weekly - 2-3 sets - 10 reps - Supine Active Straight Leg Raise  - 1-2 x daily - 7 x weekly - 2-3 sets - 10 reps - Supine Heel Slide with Strap  - 1-2 x  daily - 7 x weekly -  2-3 sets - 10 reps  ASSESSMENT:  CLINICAL IMPRESSION:  Therapy added weights to exercises using RPE. She tolerated well. We also initiated open chain strengthening. She had no increase in pain. We kept her step low because we added the weight and open chain exercises. We will progress step height next visit and begin squat training next visit if tolerated.    Eval: Patient is a 53 y.o. F who was seen today for physical therapy evaluation and treatment for S/P R meniscal repair . No signs of infection or DVT's today. Surgical site looks clean. Applied InteguDerm and guaze over stitches. Provided pt with extra's. Pt tolerated session well today. She was limited by pain and had a few lower back spasms. Patient will benefit from skilled PT to address below impairments, limitations and improve overall function.   ACTIVITY LIMITATIONS: bending, lifting, carry, locomotion, cleaning, community activity, driving, and or occupation  PERSONAL FACTORS: Asthma, Hypertension, Urticaria. Hx of ongoing knee pain are also affecting patient's functional outcome.  REHAB POTENTIAL: Good  CLINICAL DECISION MAKING: Stable/uncomplicated  EVALUATION COMPLEXITY: Low    GOALS: Short term PT Goals Target date: 03/29/24 Pt will be I and compliant with HEP. Baseline:  Goal status: New Pt will decrease pain by 25% overall Baseline: Goal status: New  Long term PT goals Target date: 05/26/24 Pt will improve ROM to [facility] to improve functional mobility Baseline: Goal status: New Pt will improve  hip/knee strength to at least 5-/5 MMT to improve functional strength Baseline: Goal status: New Pt will improve LEFS by 9 points to show improved function Baseline:27 Goal status: New Pt will reduce pain by overall 50% overall with usual activity Baseline: Goal status: New Pt will reduce pain to overall less than 2-3/10 with usual activity and work activity. Baseline: Goal status: New Pt  will be able to ambulate community distances at least 1000 ft WNL gait pattern without complaints Baseline: Goal status: New  PLAN: PT FREQUENCY: 1-2 times per week   PT DURATION: 8-12 weeks  PLANNED INTERVENTIONS (unless contraindicated): aquatic PT, Canalith repositioning, cryotherapy, Electrical stimulation, Iontophoresis with 4 mg/ml dexamethasome, Moist heat, traction, Ultrasound, gait training, Therapeutic exercise, balance training, neuromuscular re-education, patient/family education, prosthetic training, manual techniques, passive ROM, dry needling, taping, vasopnuematic device, vestibular, spinal manipulations, joint manipulations  PLAN FOR NEXT SESSION: Progress per protocol.      Alm JINNY Don, PT 04/16/2024, 8:10 AM

## 2024-04-16 ENCOUNTER — Encounter (HOSPITAL_BASED_OUTPATIENT_CLINIC_OR_DEPARTMENT_OTHER): Payer: Self-pay | Admitting: Physical Therapy

## 2024-04-19 ENCOUNTER — Ambulatory Visit (HOSPITAL_BASED_OUTPATIENT_CLINIC_OR_DEPARTMENT_OTHER): Payer: Self-pay

## 2024-04-19 ENCOUNTER — Encounter (HOSPITAL_BASED_OUTPATIENT_CLINIC_OR_DEPARTMENT_OTHER): Payer: Self-pay

## 2024-04-19 DIAGNOSIS — M6281 Muscle weakness (generalized): Secondary | ICD-10-CM

## 2024-04-19 DIAGNOSIS — M25561 Pain in right knee: Secondary | ICD-10-CM | POA: Diagnosis not present

## 2024-04-19 DIAGNOSIS — R2689 Other abnormalities of gait and mobility: Secondary | ICD-10-CM

## 2024-04-19 NOTE — Therapy (Signed)
 OUTPATIENT PHYSICAL THERAPY LOWER EXTREMITY EVALUATION   Patient Name: Cindy [name] MRN: [id number] DOB:10-16-1970, 53 y.o., female Today's Date: 04/19/2024  END OF SESSION:  PT End of Session - 04/19/24 1019     Visit Number 7    Number of Visits 16    Date for Recertification  05/26/24    Authorization Type [employer]    Progress Note Due on Visit 10    PT Start Time 1018    PT Stop Time 1101    PT Time Calculation (min) 43 min    Activity Tolerance Patient tolerated treatment well    Behavior During Therapy WFL for tasks assessed/performed               Past Medical History:  Diagnosis Date   Asthma    Hypertension    Urticaria    Past Surgical History:  Procedure Laterality Date   KNEE SURGERY Right 03/11/2024   meniscus repair   Patient Active Problem List   Diagnosis Date Noted   Seasonal and perennial allergic rhinitis 06/30/2019   Reactive airway disease 06/30/2019    PCP: Espinoza, Alejandra, DO  REFERRING PROVIDER: Genelle Standing, MD  REFERRING DIAG:  Acute medial meniscus tear of right knee, initial encounter [D16.758J]   THERAPY DIAG:  Acute pain of right knee  Muscle weakness (generalized)  Other abnormalities of gait and mobility  Rationale for Evaluation and Treatment: Rehabilitation  ONSET DATE: 03/11/24  SUBJECTIVE:   SUBJECTIVE STATEMENT:  The patient was a little sore the next day after last visit.   Pt has a long history of  knee pain. She has had PT in the past, which did not resolve the pain. She has extensive damage in her knee per her most recent MRI. She received a meniscal repair on 03/11/24. 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   PERTINENT HISTORY: Asthma Hypertension Urticaria PAIN:  Are you having pain? Yes: NPRS scale: 5/10 with WB.  Pain location: R knee at inferior patella.  Pain description: Dull ache, pulling when standing.  Aggravating factors: Standing, walking.  Relieving  factors: Sitting, resting, medicine.   PRECAUTIONS: Knee, meniscal repair   RED FLAGS: None   WEIGHT BEARING RESTRICTIONS: Yes WBAT with AD  FALLS:  Has patient fallen in last 6 months? No  LIVING ENVIRONMENT: 11-12 stairs  OCCUPATION: Nurse  PLOF: Independent  PATIENT GOALS: decreased pain, increased confidence while walking   NEXT MD VISIT: 03/25/24  OBJECTIVE:  Note: Objective measures were completed at Evaluation unless otherwise noted.  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 No evidence of acute fracture or dislocation. No focal bone lesion or bone destruction. No significant effusions. Soft tissues are unremarkable.  PATIENT SURVEYS:  LEFS: 27/80 33.75%   COGNITION: Overall cognitive status: Within functional limits for tasks assessed     SENSATION: WFL  POSTURE: No Significant postural limitations  PALPATION: Tenderness at surgical site.   LOWER EXTREMITY ROM:  Passive ROM Right eval Left eval Right   Hip flexion     Hip extension     Hip abduction     Hip adduction     Hip internal rotation     Hip external rotation     Knee flexion 78 degrees  0 106  Knee extension   -3  Ankle dorsiflexion     Ankle plantarflexion  Ankle inversion     Ankle eversion      (Blank rows = not tested)  LOWER EXTREMITY MMT:  MMT Right eval Left eval  Hip flexion    Hip extension    Hip abduction    Hip adduction    Hip internal rotation    Hip external rotation    Knee flexion    Knee extension    Ankle dorsiflexion    Ankle plantarflexion    Ankle inversion    Ankle eversion     (Blank rows = not tested)  LOWER EXTREMITY SPECIAL TESTS:    FUNCTIONAL TESTS:   GAIT: Distance walked: 29ft Assistive device utilized: 1x axillary crutch  Level of assistance: Complete Independence Comments: Walks with an  antalgic gait and WBAT.                                                                                                                                 TREATMENT DATE:  11/24  STM to posterior knee/distal HS PROM R knee Quad sets Prone TKE x5 Russian Estim 10/10 x21min Long sit HSS 3x30sec  Sci fit bike x58min full LAQ 2x10 STS with brace donned 3x10    11/20 Manual: Trigger point release to quad Hamstring release  Extension stretch with distraction  Grade II and III PA and AP mobilization   There-ex:  PROM into flexion  Quad sets x15 SLR with brace on 3x10  SL SLR 3x10 low RPE on 1st set so added 1 lb weight  SAQ 3x10 low RPE without weight so added 1 lb weight for last 2 sets   There-act:  Step up  3x10 2 inch   Lateral step up  3x10 2 inch     11/17 There-ex:  PROM into flexion  Quad sets 3x12  SLR with brace on 3x10  SL SLR 3x10   Manual: Trigger point release to quad Hamstring release  Extension stretch with distraction  Grade II and III PA and AP mobilization   There-act:  Step up  3x10 2 inch   Lateral step up  3x10 2 inch   11/10 There-ex:  Heel slides into flexion  SL SLR 2x15  Standing hip abduction and extension 2x15   Gait:  Weight shifts into fwd staggered stance and lateral without brace 1.5 laps around clinic with brace unlocked to 90 degrees with focus on knee flexion and heel to toe gait.   Neuro-re-ed:  Heel/toe rock 3x10  Standing on airex pad marching for 1 min with brace on. TKE with blue TB resistance.    PATIENT EDUCATION:  Education details: Educated pt on anatomy and physiology of current symptoms, LEFS, diagnosis, prognosis, HEP,  and POC. Person educated: Patient Education method: Explanation, Demonstration, Verbal cues, and Handouts Education comprehension: verbalized understanding and returned demonstration  HOME EXERCISE PROGRAM: Access Code: [email or web address] URL: [email or web address] Date:  03/15/2024 Prepared by: Rojean Batten  Exercises - Supine Quad  Set  - 1-2 x daily - 7 x weekly - 2-3 sets - 10 reps - Supine Active Straight Leg Raise  - 1-2 x daily - 7 x weekly - 2-3 sets - 10 reps - Supine Heel Slide with Strap  - 1-2 x daily - 7 x weekly - 2-3 sets - 10 reps  ASSESSMENT:  CLINICAL IMPRESSION:  Pt continues to lack quad activation isosterically so performed russian Estim to improve this. Pt lacks extension in knee so focused on that in clinic today. Good tolerance for sci fit bike, though some stiffness reported. Discussed proper HEP frequency and heel prop exercise to perform at home. No complaints of pain with STS. Adjusted brace and shortened straps to allow for improved tightening. Will continue to monitor pain level as we progress with ROM and strength.    Eval: Patient is a 53 y.o. F who was seen today for physical therapy evaluation and treatment for S/P R meniscal repair . No signs of infection or DVT's today. Surgical site looks clean. Applied InteguDerm and guaze over stitches. Provided pt with extra's. Pt tolerated session well today. She was limited by pain and had a few lower back spasms. Patient will benefit from skilled PT to address below impairments, limitations and improve overall function.   ACTIVITY LIMITATIONS: bending, lifting, carry, locomotion, cleaning, community activity, driving, and or occupation  PERSONAL FACTORS: Asthma, Hypertension, Urticaria. Hx of ongoing knee pain are also affecting patient's functional outcome.  REHAB POTENTIAL: Good  CLINICAL DECISION MAKING: Stable/uncomplicated  EVALUATION COMPLEXITY: Low    GOALS: Short term PT Goals Target date: 03/29/24 Pt will be I and compliant with HEP. Baseline:  Goal status: New Pt will decrease pain by 25% overall Baseline: Goal status: New  Long term PT goals Target date: 05/26/24 Pt will improve ROM to [facility] to improve functional mobility Baseline: Goal status: New Pt will  improve  hip/knee strength to at least 5-/5 MMT to improve functional strength Baseline: Goal status: New Pt will improve LEFS by 9 points to show improved function Baseline:27 Goal status: New Pt will reduce pain by overall 50% overall with usual activity Baseline: Goal status: New Pt will reduce pain to overall less than 2-3/10 with usual activity and work activity. Baseline: Goal status: New Pt will be able to ambulate community distances at least 1000 ft WNL gait pattern without complaints Baseline: Goal status: New  PLAN: PT FREQUENCY: 1-2 times per week   PT DURATION: 8-12 weeks  PLANNED INTERVENTIONS (unless contraindicated): aquatic PT, Canalith repositioning, cryotherapy, Electrical stimulation, Iontophoresis with 4 mg/ml dexamethasome, Moist heat, traction, Ultrasound, gait training, Therapeutic exercise, balance training, neuromuscular re-education, patient/family education, prosthetic training, manual techniques, passive ROM, dry needling, taping, vasopnuematic device, vestibular, spinal manipulations, joint manipulations  PLAN FOR NEXT SESSION: Progress per protocol.      Asberry BRAVO Hortence Charter, PTA 04/19/2024, 11:23 AM

## 2024-04-21 ENCOUNTER — Encounter (HOSPITAL_BASED_OUTPATIENT_CLINIC_OR_DEPARTMENT_OTHER): Payer: Self-pay

## 2024-04-21 ENCOUNTER — Ambulatory Visit (HOSPITAL_BASED_OUTPATIENT_CLINIC_OR_DEPARTMENT_OTHER): Payer: Self-pay

## 2024-04-21 DIAGNOSIS — R2689 Other abnormalities of gait and mobility: Secondary | ICD-10-CM

## 2024-04-21 DIAGNOSIS — M6281 Muscle weakness (generalized): Secondary | ICD-10-CM

## 2024-04-21 DIAGNOSIS — M25561 Pain in right knee: Secondary | ICD-10-CM | POA: Diagnosis not present

## 2024-04-21 NOTE — Therapy (Signed)
 OUTPATIENT PHYSICAL THERAPY LOWER EXTREMITY EVALUATION   Patient Name: Cindy [name] MRN: [id number] DOB:Jan 22, 1971, 53 y.o., female Today's Date: 04/21/2024  END OF SESSION:  PT End of Session - 04/21/24 1100     Visit Number 8    Number of Visits 16    Date for Recertification  05/26/24    Authorization Type [employer]    Progress Note Due on Visit 10    PT Start Time 1103    PT Stop Time 1145    PT Time Calculation (min) 42 min    Activity Tolerance Patient tolerated treatment well    Behavior During Therapy WFL for tasks assessed/performed                Past Medical History:  Diagnosis Date   Asthma    Hypertension    Urticaria    Past Surgical History:  Procedure Laterality Date   KNEE SURGERY Right 03/11/2024   meniscus repair   Patient Active Problem List   Diagnosis Date Noted   Seasonal and perennial allergic rhinitis 06/30/2019   Reactive airway disease 06/30/2019    PCP: Espinoza, Alejandra, DO  REFERRING PROVIDER: Genelle Standing, MD  REFERRING DIAG:  Acute medial meniscus tear of right knee, initial encounter [D16.758J]   THERAPY DIAG:  Acute pain of right knee  Muscle weakness (generalized)  Other abnormalities of gait and mobility  Rationale for Evaluation and Treatment: Rehabilitation  ONSET DATE: 03/11/24  SUBJECTIVE:   SUBJECTIVE STATEMENT:  The patient was a little sore the next day after last visit.   Pt has a long history of  knee pain. She has had PT in the past, which did not resolve the pain. She has extensive damage in her knee per her most recent MRI. She received a meniscal repair on 03/11/24. 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   PERTINENT HISTORY: Asthma Hypertension Urticaria PAIN:  Are you having pain? Yes: NPRS scale: 5/10 with WB.  Pain location: R knee at inferior patella.  Pain description: Dull ache, pulling when standing.  Aggravating factors: Standing, walking.  Relieving  factors: Sitting, resting, medicine.   PRECAUTIONS: Knee, meniscal repair   RED FLAGS: None   WEIGHT BEARING RESTRICTIONS: Yes WBAT with AD  FALLS:  Has patient fallen in last 6 months? No  LIVING ENVIRONMENT: 11-12 stairs  OCCUPATION: Nurse  PLOF: Independent  PATIENT GOALS: decreased pain, increased confidence while walking   NEXT MD VISIT: 03/25/24  OBJECTIVE:  Note: Objective measures were completed at Evaluation unless otherwise noted.  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 No evidence of acute fracture or dislocation. No focal bone lesion or bone destruction. No significant effusions. Soft tissues are unremarkable.  PATIENT SURVEYS:  LEFS: 27/80 33.75%   COGNITION: Overall cognitive status: Within functional limits for tasks assessed     SENSATION: WFL  POSTURE: No Significant postural limitations  PALPATION: Tenderness at surgical site.   LOWER EXTREMITY ROM:  Passive ROM Right eval Left eval Right   Hip flexion     Hip extension     Hip abduction     Hip adduction     Hip internal rotation     Hip external rotation     Knee flexion 78 degrees  0 106  Knee extension   -3  Ankle dorsiflexion     Ankle plantarflexion  Ankle inversion     Ankle eversion      (Blank rows = not tested)  LOWER EXTREMITY MMT:  MMT Right eval Left eval  Hip flexion    Hip extension    Hip abduction    Hip adduction    Hip internal rotation    Hip external rotation    Knee flexion    Knee extension    Ankle dorsiflexion    Ankle plantarflexion    Ankle inversion    Ankle eversion     (Blank rows = not tested)  LOWER EXTREMITY SPECIAL TESTS:    FUNCTIONAL TESTS:   GAIT: Distance walked: 90ft Assistive device utilized: 1x axillary crutch  Level of assistance: Complete Independence Comments: Walks with an  antalgic gait and WBAT.                                                                                                                                 TREATMENT DATE:  11/26 PROM R knee Heel prop with manual overpressure Quad sets 5 x15 SLR 2x10 Prone TKE 5 2x10 LAQ 5 2x10 STS with brace doffed 2x10 Standing HR 2x10 Tandem balance 3x20sec R posterior Scifit bike x41min L3 Step ups 4 2x10     11/24  STM to posterior knee/distal HS PROM R knee Quad sets Prone TKE x5 Russian Estim 10/10 x42min Long sit HSS 3x30sec  Sci fit bike x35min full LAQ 2x10 STS with brace donned 3x10    11/20 Manual: Trigger point release to quad Hamstring release  Extension stretch with distraction  Grade II and III PA and AP mobilization   There-ex:  PROM into flexion  Quad sets x15 SLR with brace on 3x10  SL SLR 3x10 low RPE on 1st set so added 1 lb weight  SAQ 3x10 low RPE without weight so added 1 lb weight for last 2 sets   There-act:  Step up  3x10 2 inch   Lateral step up  3x10 2 inch     11/17 There-ex:  PROM into flexion  Quad sets 3x12  SLR with brace on 3x10  SL SLR 3x10   Manual: Trigger point release to quad Hamstring release  Extension stretch with distraction  Grade II and III PA and AP mobilization   There-act:  Step up  3x10 2 inch   Lateral step up  3x10 2 inch   11/10 There-ex:  Heel slides into flexion  SL SLR 2x15  Standing hip abduction and extension 2x15   Gait:  Weight shifts into fwd staggered stance and lateral without brace 1.5 laps around clinic with brace unlocked to 90 degrees with focus on knee flexion and heel to toe gait.   Neuro-re-ed:  Heel/toe rock 3x10  Standing on airex pad marching for 1 min with brace on. TKE with blue TB resistance.    PATIENT EDUCATION:  Education details: Educated pt on anatomy and physiology of current  symptoms, LEFS, diagnosis, prognosis, HEP,  and POC. Person educated:  Patient Education method: Explanation, Demonstration, Verbal cues, and Handouts Education comprehension: verbalized understanding and returned demonstration  HOME EXERCISE PROGRAM: Access Code: [email or web address] URL: [email or web address] Date: 03/15/2024 Prepared by: Rojean Batten  Exercises - Supine Quad Set  - 1-2 x daily - 7 x weekly - 2-3 sets - 10 reps - Supine Active Straight Leg Raise  - 1-2 x daily - 7 x weekly - 2-3 sets - 10 reps - Supine Heel Slide with Strap  - 1-2 x daily - 7 x weekly - 2-3 sets - 10 reps  ASSESSMENT:  CLINICAL IMPRESSION:  Pt with significant improvement in R quad facilitation compared to previous visit. She was able to complete LAQ and SLR with improve performance, as well as prone TKE. Able to progress to 4 step ups with cues for TKE attempt in full WB. Pt denied pain with any task performed today. Advised in use of ice for management of DOMS. Will continue to work on improving knee ROM and strength as tolerated.   Eval: Patient is a 53 y.o. F who was seen today for physical therapy evaluation and treatment for S/P R meniscal repair . No signs of infection or DVT's today. Surgical site looks clean. Applied InteguDerm and guaze over stitches. Provided pt with extra's. Pt tolerated session well today. She was limited by pain and had a few lower back spasms. Patient will benefit from skilled PT to address below impairments, limitations and improve overall function.   ACTIVITY LIMITATIONS: bending, lifting, carry, locomotion, cleaning, community activity, driving, and or occupation  PERSONAL FACTORS: Asthma, Hypertension, Urticaria. Hx of ongoing knee pain are also affecting patient's functional outcome.  REHAB POTENTIAL: Good  CLINICAL DECISION MAKING: Stable/uncomplicated  EVALUATION COMPLEXITY: Low    GOALS: Short term PT Goals Target date: 03/29/24 Pt will be I and compliant with HEP. Baseline:  Goal status: New Pt will decrease pain by  25% overall Baseline: Goal status: New  Long term PT goals Target date: 05/26/24 Pt will improve ROM to [facility] to improve functional mobility Baseline: Goal status: New Pt will improve  hip/knee strength to at least 5-/5 MMT to improve functional strength Baseline: Goal status: New Pt will improve LEFS by 9 points to show improved function Baseline:27 Goal status: New Pt will reduce pain by overall 50% overall with usual activity Baseline: Goal status: New Pt will reduce pain to overall less than 2-3/10 with usual activity and work activity. Baseline: Goal status: New Pt will be able to ambulate community distances at least 1000 ft WNL gait pattern without complaints Baseline: Goal status: New  PLAN: PT FREQUENCY: 1-2 times per week   PT DURATION: 8-12 weeks  PLANNED INTERVENTIONS (unless contraindicated): aquatic PT, Canalith repositioning, cryotherapy, Electrical stimulation, Iontophoresis with 4 mg/ml dexamethasome, Moist heat, traction, Ultrasound, gait training, Therapeutic exercise, balance training, neuromuscular re-education, patient/family education, prosthetic training, manual techniques, passive ROM, dry needling, taping, vasopnuematic device, vestibular, spinal manipulations, joint manipulations  PLAN FOR NEXT SESSION: Progress per protocol.      Asberry BRAVO Deaundra Dupriest, PTA 04/21/2024, 11:51 AM

## 2024-04-27 ENCOUNTER — Ambulatory Visit (HOSPITAL_BASED_OUTPATIENT_CLINIC_OR_DEPARTMENT_OTHER): Attending: Sports Medicine

## 2024-04-27 ENCOUNTER — Encounter (HOSPITAL_BASED_OUTPATIENT_CLINIC_OR_DEPARTMENT_OTHER): Payer: Self-pay

## 2024-04-27 DIAGNOSIS — R2689 Other abnormalities of gait and mobility: Secondary | ICD-10-CM | POA: Diagnosis present

## 2024-04-27 DIAGNOSIS — M6281 Muscle weakness (generalized): Secondary | ICD-10-CM | POA: Insufficient documentation

## 2024-04-27 DIAGNOSIS — M25561 Pain in right knee: Secondary | ICD-10-CM | POA: Diagnosis present

## 2024-04-27 NOTE — Therapy (Signed)
 OUTPATIENT PHYSICAL THERAPY LOWER EXTREMITY TREATMENT   Patient Name: Cindy [name] MRN: [id number] DOB:May 05, 1971, 53 y.o., female Today's Date: 04/27/2024  END OF SESSION:  PT End of Session - 04/27/24 1353     Visit Number 9    Number of Visits 16    Date for Recertification  05/26/24    Authorization Type [employer]    Progress Note Due on Visit 10    PT Start Time 1347    PT Stop Time 1435    PT Time Calculation (min) 48 min    Activity Tolerance Patient tolerated treatment well    Behavior During Therapy [facility] for tasks assessed/performed                 Past Medical History:  Diagnosis Date   Asthma    Hypertension    Urticaria    Past Surgical History:  Procedure Laterality Date   KNEE SURGERY Right 03/11/2024   meniscus repair   Patient Active Problem List   Diagnosis Date Noted   Seasonal and perennial allergic rhinitis 06/30/2019   Reactive airway disease 06/30/2019    PCP: Espinoza, Alejandra, DO  REFERRING PROVIDER: Genelle Standing, MD  REFERRING DIAG:  Acute medial meniscus tear of right knee, initial encounter [D16.758J]   THERAPY DIAG:  Acute pain of right knee  Muscle weakness (generalized)  Other abnormalities of gait and mobility  Rationale for Evaluation and Treatment: Rehabilitation  ONSET DATE: 03/11/24  SUBJECTIVE:   SUBJECTIVE STATEMENT:  Pt reports ongoing stiffness.   Pt has a long history of  knee pain. She has had PT in the past, which did not resolve the pain. She has extensive damage in her knee per her most recent MRI. She received a meniscal repair on 03/11/24. 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   PERTINENT HISTORY: Asthma Hypertension Urticaria PAIN:  Are you having pain? Yes: NPRS scale: 3-4/10 with WB.  Pain location: R knee at inferior patella.  Pain description: Dull ache, pulling when standing.  Aggravating factors: Standing, walking.  Relieving factors: Sitting, resting,  medicine.   PRECAUTIONS: Knee, meniscal repair   RED FLAGS: None   WEIGHT BEARING RESTRICTIONS: Yes WBAT with AD  FALLS:  Has patient fallen in last 6 months? No  LIVING ENVIRONMENT: 11-12 stairs  OCCUPATION: Nurse  PLOF: Independent  PATIENT GOALS: decreased pain, increased confidence while walking   NEXT MD VISIT: 03/25/24  OBJECTIVE:  Note: Objective measures were completed at Evaluation unless otherwise noted.  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 No evidence of acute fracture or dislocation. No focal bone lesion or bone destruction. No significant effusions. Soft tissues are unremarkable.  PATIENT SURVEYS:  LEFS: 27/80 33.75%   COGNITION: Overall cognitive status: Within functional limits for tasks assessed     SENSATION: WFL  POSTURE: No Significant postural limitations  PALPATION: Tenderness at surgical site.   LOWER EXTREMITY ROM:  Passive ROM Right eval Left eval Right   Hip flexion     Hip extension     Hip abduction     Hip adduction     Hip internal rotation     Hip external rotation     Knee flexion 78 degrees  0 106  Knee extension   -3  Ankle dorsiflexion     Ankle plantarflexion     Ankle inversion  Ankle eversion      (Blank rows = not tested)  LOWER EXTREMITY MMT:  MMT Right eval Left eval  Hip flexion    Hip extension    Hip abduction    Hip adduction    Hip internal rotation    Hip external rotation    Knee flexion    Knee extension    Ankle dorsiflexion    Ankle plantarflexion    Ankle inversion    Ankle eversion     (Blank rows = not tested)  LOWER EXTREMITY SPECIAL TESTS:    FUNCTIONAL TESTS:   GAIT: Distance walked: 63ft Assistive device utilized: 1x axillary crutch  Level of assistance: Complete Independence Comments: Walks with an antalgic gait and WBAT.                                                                                                                                  TREATMENT DATE:  12/2 PROM R knee Heel prop with manual overpressure Quad sets 5 x15 SLR 2x10 S/l hip abduction 2x10 Bridges 2x10 Prone TKE 5 2x10 LAQ 5 2x10 STS  2x10 Standing HR 3x10 Scifit bike x30min L3 Step ups 4 2x10 fwd and lateral  11/26 PROM R knee Heel prop with manual overpressure Quad sets 5 x15 SLR 2x10 Prone TKE 5 2x10 LAQ 5 2x10 STS with brace doffed 2x10 Standing HR 2x10 Tandem balance 3x20sec R posterior Scifit bike x20min L3 Step ups 4 2x10     11/24  STM to posterior knee/distal HS PROM R knee Quad sets Prone TKE x5 Russian Estim 10/10 x25min Long sit HSS 3x30sec  Sci fit bike x55min full LAQ 2x10 STS with brace donned 3x10    11/20 Manual: Trigger point release to quad Hamstring release  Extension stretch with distraction  Grade II and III PA and AP mobilization   There-ex:  PROM into flexion  Quad sets x15 SLR with brace on 3x10  SL SLR 3x10 low RPE on 1st set so added 1 lb weight  SAQ 3x10 low RPE without weight so added 1 lb weight for last 2 sets   There-act:  Step up  3x10 2 inch   Lateral step up  3x10 2 inch     11/17 There-ex:  PROM into flexion  Quad sets 3x12  SLR with brace on 3x10  SL SLR 3x10   Manual: Trigger point release to quad Hamstring release  Extension stretch with distraction  Grade II and III PA and AP mobilization   There-act:  Step up  3x10 2 inch   Lateral step up  3x10 2 inch    PATIENT EDUCATION:  Education details: Educated pt on anatomy and physiology of current symptoms, LEFS, diagnosis, prognosis, HEP,  and POC. Person educated: Patient Education method: Explanation, Demonstration, Verbal cues, and Handouts Education comprehension: verbalized understanding and returned demonstration  HOME EXERCISE PROGRAM: Access Code: [email or web address] URL:  [email or web address] Date: 03/15/2024  Prepared by: Rojean Batten  Exercises - Supine Quad Set  - 1-2 x daily - 7 x weekly - 2-3 sets - 10 reps - Supine Active Straight Leg Raise  - 1-2 x daily - 7 x weekly - 2-3 sets - 10 reps - Supine Heel Slide with Strap  - 1-2 x daily - 7 x weekly - 2-3 sets - 10 reps  ASSESSMENT:  CLINICAL IMPRESSION:  Continued to work on increasing knee ROM today as pt has ongoing stiffness into both flexion and extension. Pt does experience some soreness with PROM light overpressure. Instructed pt in DOMS management. Pt instructed to avoid prolonged static positioning of R knee as able throughout the day. Added lateral step ups at 4 step today without c/o increased pain. No complaints with hip strengthening on plinth. Will continue to progress as tolerated.    Eval: Patient is a 53 y.o. F who was seen today for physical therapy evaluation and treatment for S/P R meniscal repair . No signs of infection or DVT's today. Surgical site looks clean. Applied InteguDerm and guaze over stitches. Provided pt with extra's. Pt tolerated session well today. She was limited by pain and had a few lower back spasms. Patient will benefit from skilled PT to address below impairments, limitations and improve overall function.   ACTIVITY LIMITATIONS: bending, lifting, carry, locomotion, cleaning, community activity, driving, and or occupation  PERSONAL FACTORS: Asthma, Hypertension, Urticaria. Hx of ongoing knee pain are also affecting patient's functional outcome.  REHAB POTENTIAL: Good  CLINICAL DECISION MAKING: Stable/uncomplicated  EVALUATION COMPLEXITY: Low    GOALS: Short term PT Goals Target date: 03/29/24 Pt will be I and compliant with HEP. Baseline:  Goal status: New Pt will decrease pain by 25% overall Baseline: Goal status: New  Long term PT goals Target date: 05/26/24 Pt will improve ROM to [facility] to improve functional mobility Baseline: Goal  status: New Pt will improve  hip/knee strength to at least 5-/5 MMT to improve functional strength Baseline: Goal status: New Pt will improve LEFS by 9 points to show improved function Baseline:27 Goal status: New Pt will reduce pain by overall 50% overall with usual activity Baseline: Goal status: New Pt will reduce pain to overall less than 2-3/10 with usual activity and work activity. Baseline: Goal status: New Pt will be able to ambulate community distances at least 1000 ft WNL gait pattern without complaints Baseline: Goal status: New  PLAN: PT FREQUENCY: 1-2 times per week   PT DURATION: 8-12 weeks  PLANNED INTERVENTIONS (unless contraindicated): aquatic PT, Canalith repositioning, cryotherapy, Electrical stimulation, Iontophoresis with 4 mg/ml dexamethasome, Moist heat, traction, Ultrasound, gait training, Therapeutic exercise, balance training, neuromuscular re-education, patient/family education, prosthetic training, manual techniques, passive ROM, dry needling, taping, vasopnuematic device, vestibular, spinal manipulations, joint manipulations  PLAN FOR NEXT SESSION: Progress per protocol.      Asberry BRAVO Oretta Berkland, PTA 04/27/2024, 4:51 PM

## 2024-04-28 ENCOUNTER — Ambulatory Visit (INDEPENDENT_AMBULATORY_CARE_PROVIDER_SITE_OTHER): Admitting: Orthopaedic Surgery

## 2024-04-28 ENCOUNTER — Ambulatory Visit

## 2024-04-28 DIAGNOSIS — J309 Allergic rhinitis, unspecified: Secondary | ICD-10-CM

## 2024-04-28 DIAGNOSIS — S83241A Other tear of medial meniscus, current injury, right knee, initial encounter: Secondary | ICD-10-CM

## 2024-04-28 NOTE — Progress Notes (Signed)
 Post Operative Evaluation    Procedure/Date of Surgery: Right knee medial meniscus repair 10/16  Interval History:   Presents 6 weeks status post above procedure.  At this time she is continuing to improve.  She is walking longer distances and overall doing quite well with physical therapy   PMH/PSH/Family History/Social History/Meds/Allergies:    Past Medical History:  Diagnosis Date   Asthma    Hypertension    Urticaria    Past Surgical History:  Procedure Laterality Date   KNEE SURGERY Right 03/11/2024   meniscus repair   Social History   Socioeconomic History   Marital status: Married    Spouse name: Not on file   Number of children: Not on file   Years of education: Not on file   Highest education level: Not on file  Occupational History   Not on file  Tobacco Use   Smoking status: Never   Smokeless tobacco: Never  Vaping Use   Vaping status: Never Used  Substance and Sexual Activity   Alcohol use: Not Currently   Drug use: Never   Sexual activity: Not on file  Other Topics Concern   Not on file  Social History Narrative   Not on file   Social Drivers of Health   Financial Resource Strain: Not on file  Food Insecurity: Not on file  Transportation Needs: Not on file  Physical Activity: Not on file  Stress: Not on file  Social Connections: Not on file   Family History  Problem Relation Age of Onset   Allergic rhinitis Father    Asthma Father    Asthma Brother    Allergies  Allergen Reactions   Doxycycline Other (See Comments)   Iron Other (See Comments)   Minocycline Other (See Comments)   Current Outpatient Medications  Medication Sig Dispense Refill   aspirin  EC 325 MG tablet Take 1 tablet (325 mg total) by mouth daily. (Patient not taking: Reported on 04/07/2024) 14 tablet 0   Carbinoxamine  Maleate 4 MG TABS TAKE ONE AND ONE-HALF TABLETS BY MOUTH TWICE DAILY AS NEEDED for allergies 270 tablet 1    Cholecalciferol (VITAMIN D) 125 MCG (5000 UT) CAPS Take 1 capsule by mouth daily at 12 noon.     ciclopirox (PENLAC) 8 % solution Apply topically. (Patient not taking: Reported on 04/07/2024)     EPINEPHrine  0.3 mg/0.3 mL IJ SOAJ injection Inject 0.3 mg into the muscle as needed for anaphylaxis. 2 each 1   Fluticasone  Furoate (ARNUITY ELLIPTA ) 100 MCG/ACT AEPB Inhale 1 puff into the lungs daily. Rinse mouth after each use. (Patient not taking: Reported on 04/07/2024) 30 each 5   levalbuterol  (XOPENEX  HFA) 45 MCG/ACT inhaler Inhale 2 puffs into the lungs every 4 (four) hours as needed for wheezing or shortness of breath (coughing). 15 g 1   montelukast  (SINGULAIR ) 10 MG tablet Take 1 tablet (10 mg total) by mouth at bedtime. 30 tablet 5   naproxen  (NAPROSYN ) 500 MG tablet Take 1 tablet (500 mg total) by mouth 2 (two) times daily. (Patient not taking: Reported on 04/07/2024) 30 tablet 0   olmesartan (BENICAR) 40 MG tablet Take 40 mg by mouth daily.     Olopatadine  HCl 0.6 % SOLN Place 1-2 sprays into both nostrils 2 (two) times daily as needed (drainage). (Patient not taking:  Reported on 04/07/2024) 30.5 g 5   Olopatadine -Mometasone ([name] ) 665-25 MCG/ACT SUSP Place 1-2 sprays into the nose in the morning and at bedtime. 29 g 5   oxyCODONE  (ROXICODONE ) 5 MG immediate release tablet Take 1 tablet (5 mg total) by mouth every 4 (four) hours as needed for severe pain (pain score 7-10) or breakthrough pain. (Patient not taking: Reported on 04/07/2024) 10 tablet 0   rosuvastatin (CRESTOR) 5 MG tablet Take 5 mg by mouth daily.     No current facility-administered medications for this visit.   No results found.  Review of Systems:   A ROS was performed including pertinent positives and negatives as documented in the HPI.   Musculoskeletal Exam:    There were no vitals taken for this visit.  Right knee incisions are well-appearing without erythema or drainage.  Distal neurosensory exam is intact  range of motion is from 0 to 90 degrees with trace effusion  Imaging:      I personally reviewed and interpreted the radiographs.   Assessment:   6 weeks status post right knee medial meniscal repair overall doing well.  At this time she is continuing to improve nicely.  I will plan to see her back in 6 weeks for reassessment  Plan :    - Return to clinic 6 weeks for reassessment      I personally saw and evaluated the patient, and participated in the management and treatment plan.  Elspeth Parker, MD Attending Physician, Orthopedic Surgery  This document was dictated using Dragon voice recognition software. A reasonable attempt at proof reading has been made to minimize errors.

## 2024-04-30 ENCOUNTER — Ambulatory Visit (HOSPITAL_BASED_OUTPATIENT_CLINIC_OR_DEPARTMENT_OTHER): Payer: Self-pay

## 2024-05-04 ENCOUNTER — Ambulatory Visit (HOSPITAL_BASED_OUTPATIENT_CLINIC_OR_DEPARTMENT_OTHER)

## 2024-05-04 ENCOUNTER — Encounter (HOSPITAL_BASED_OUTPATIENT_CLINIC_OR_DEPARTMENT_OTHER): Payer: Self-pay

## 2024-05-04 DIAGNOSIS — M6281 Muscle weakness (generalized): Secondary | ICD-10-CM

## 2024-05-04 DIAGNOSIS — M25561 Pain in right knee: Secondary | ICD-10-CM | POA: Diagnosis not present

## 2024-05-04 DIAGNOSIS — R2689 Other abnormalities of gait and mobility: Secondary | ICD-10-CM

## 2024-05-04 NOTE — Therapy (Signed)
 OUTPATIENT PHYSICAL THERAPY LOWER EXTREMITY TREATMENT   Patient Name: Cindy [name] MRN: [id number] DOB:May 13, 1971, 53 y.o., female Today's Date: 05/04/2024  END OF SESSION:  PT End of Session - 05/04/24 0937     Visit Number 10    Number of Visits 16    Date for Recertification  05/26/24    Authorization Type [employer]    Progress Note Due on Visit 10    PT Start Time 0934    PT Stop Time 1016    PT Time Calculation (min) 42 min    Activity Tolerance Patient tolerated treatment well    Behavior During Therapy [facility] for tasks assessed/performed                  Past Medical History:  Diagnosis Date   Asthma    Hypertension    Urticaria    Past Surgical History:  Procedure Laterality Date   KNEE SURGERY Right 03/11/2024   meniscus repair   Patient Active Problem List   Diagnosis Date Noted   Seasonal and perennial allergic rhinitis 06/30/2019   Reactive airway disease 06/30/2019    PCP: Espinoza, Alejandra, DO  REFERRING PROVIDER: Genelle Standing, MD  REFERRING DIAG:  Acute medial meniscus tear of right knee, initial encounter [D16.758J]   THERAPY DIAG:  Acute pain of right knee  Muscle weakness (generalized)  Other abnormalities of gait and mobility  Rationale for Evaluation and Treatment: Rehabilitation  ONSET DATE: 03/11/24  SUBJECTIVE:   SUBJECTIVE STATEMENT:  Pt reports ongoing stiffness.   Pt has a long history of  knee pain. She has had PT in the past, which did not resolve the pain. She has extensive damage in her knee per her most recent MRI. She received a meniscal repair on 03/11/24. 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   PERTINENT HISTORY: Asthma Hypertension Urticaria PAIN:  Are you having pain? Yes: NPRS scale: 3-4/10 with WB.  Pain location: R knee at inferior patella.  Pain description: Dull ache, pulling when standing.  Aggravating factors: Standing, walking.  Relieving factors: Sitting, resting,  medicine.   PRECAUTIONS: Knee, meniscal repair   RED FLAGS: None   WEIGHT BEARING RESTRICTIONS: Yes WBAT with AD  FALLS:  Has patient fallen in last 6 months? No  LIVING ENVIRONMENT: 11-12 stairs  OCCUPATION: Nurse  PLOF: Independent  PATIENT GOALS: decreased pain, increased confidence while walking   NEXT MD VISIT: 03/25/24  OBJECTIVE:  Note: Objective measures were completed at Evaluation unless otherwise noted.  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 No evidence of acute fracture or dislocation. No focal bone lesion or bone destruction. No significant effusions. Soft tissues are unremarkable.  PATIENT SURVEYS:  LEFS: 27/80 33.75%   COGNITION: Overall cognitive status: Within functional limits for tasks assessed     SENSATION: WFL  POSTURE: No Significant postural limitations  PALPATION: Tenderness at surgical site.   LOWER EXTREMITY ROM:  Passive ROM Right eval Left eval Right  Right 12/9  Hip flexion      Hip extension      Hip abduction      Hip adduction      Hip internal rotation      Hip external rotation      Knee flexion 78 degrees  0 106 118  Knee extension   -3 -3  Ankle dorsiflexion  Ankle plantarflexion      Ankle inversion      Ankle eversion       (Blank rows = not tested)  LOWER EXTREMITY MMT:  MMT Right eval Left eval  Hip flexion    Hip extension    Hip abduction    Hip adduction    Hip internal rotation    Hip external rotation    Knee flexion    Knee extension    Ankle dorsiflexion    Ankle plantarflexion    Ankle inversion    Ankle eversion     (Blank rows = not tested)  LOWER EXTREMITY SPECIAL TESTS:    FUNCTIONAL TESTS:   GAIT: Distance walked: 59ft Assistive device utilized: 1x axillary crutch  Level of assistance: Complete Independence Comments: Walks with an  antalgic gait and WBAT.                                                                                                                                 TREATMENT DATE:  12/9 PROM R knee 118 deg active flexion 3deg lacking extension Heel prop with manual overpressure SLR 2x10 S/l hip abduction 2x15 Bridges 2x10 LAQ 3# 5 2x10 STS  2x10 Scifit bike x47min L3 Step ups 4 x5, 6 2x10 R  12/2 PROM R knee Heel prop with manual overpressure Quad sets 5 x15 SLR 2x10 S/l hip abduction 2x10 Bridges 2x10 Prone TKE 5 2x10 LAQ 5 2x10 STS  2x10 Standing HR 3x10 Scifit bike x91min L3 Step ups 4 2x10 fwd and lateral  11/26 PROM R knee Heel prop with manual overpressure Quad sets 5 x15 SLR 2x10 Prone TKE 5 2x10 LAQ 5 2x10 STS with brace doffed 2x10 Standing HR 2x10 Tandem balance 3x20sec R posterior Scifit bike x74min L3 Step ups 4 2x10     11/24  STM to posterior knee/distal HS PROM R knee Quad sets Prone TKE x5 Russian Estim 10/10 x39min Long sit HSS 3x30sec  Sci fit bike x18min full LAQ 2x10 STS with brace donned 3x10    11/20 Manual: Trigger point release to quad Hamstring release  Extension stretch with distraction  Grade II and III PA and AP mobilization   There-ex:  PROM into flexion  Quad sets x15 SLR with brace on 3x10  SL SLR 3x10 low RPE on 1st set so added 1 lb weight  SAQ 3x10 low RPE without weight so added 1 lb weight for last 2 sets   There-act:  Step up  3x10 2 inch   Lateral step up  3x10 2 inch     11/17 There-ex:  PROM into flexion  Quad sets 3x12  SLR with brace on 3x10  SL SLR 3x10   Manual: Trigger point release to quad Hamstring release  Extension stretch with distraction  Grade II and III PA and AP mobilization   There-act:  Step up  3x10 2 inch   Lateral step up  3x10 2 inch    PATIENT EDUCATION:  Education details: Educated pt on anatomy and physiology of current symptoms, LEFS, diagnosis,  prognosis, HEP,  and POC. Person educated: Patient Education method: Explanation, Demonstration, Verbal cues, and Handouts Education comprehension: verbalized understanding and returned demonstration  HOME EXERCISE PROGRAM: Access Code: [email or web address] URL: [email or web address] Date: 03/15/2024 Prepared by: Rojean Batten  Exercises - Supine Quad Set  - 1-2 x daily - 7 x weekly - 2-3 sets - 10 reps - Supine Active Straight Leg Raise  - 1-2 x daily - 7 x weekly - 2-3 sets - 10 reps - Supine Heel Slide with Strap  - 1-2 x daily - 7 x weekly - 2-3 sets - 10 reps  ASSESSMENT:  CLINICAL IMPRESSION:  Pt with improved active flexion to 118deg today. Pt remains limited to 3 deg in knee extension. Continued with interventions to improve this in clinic. Pt instructed in prone TKE stretch to perform at home. Increased height with step ups today to 6 with good tolerance. Pt progressing well overall. She will benefit from ongoing PT to address ROM deficits and building functional strength. Will continue to progress as tolerated.    Eval: Patient is a 53 y.o. F who was seen today for physical therapy evaluation and treatment for S/P R meniscal repair . No signs of infection or DVT's today. Surgical site looks clean. Applied InteguDerm and guaze over stitches. Provided pt with extra's. Pt tolerated session well today. She was limited by pain and had a few lower back spasms. Patient will benefit from skilled PT to address below impairments, limitations and improve overall function.   ACTIVITY LIMITATIONS: bending, lifting, carry, locomotion, cleaning, community activity, driving, and or occupation  PERSONAL FACTORS: Asthma, Hypertension, Urticaria. Hx of ongoing knee pain are also affecting patient's functional outcome.  REHAB POTENTIAL: Good  CLINICAL DECISION MAKING: Stable/uncomplicated  EVALUATION COMPLEXITY: Low    GOALS: Short term PT Goals Target date: 03/29/24 Pt will be I and  compliant with HEP. Baseline:  Goal status: New Pt will decrease pain by 25% overall Baseline: Goal status: New  Long term PT goals Target date: 05/26/24 Pt will improve ROM to [facility] to improve functional mobility Baseline: Goal status: New Pt will improve  hip/knee strength to at least 5-/5 MMT to improve functional strength Baseline: Goal status: New Pt will improve LEFS by 9 points to show improved function Baseline:27 Goal status: New Pt will reduce pain by overall 50% overall with usual activity Baseline: Goal status: New Pt will reduce pain to overall less than 2-3/10 with usual activity and work activity. Baseline: Goal status: New Pt will be able to ambulate community distances at least 1000 ft WNL gait pattern without complaints Baseline: Goal status: New  PLAN: PT FREQUENCY: 1-2 times per week   PT DURATION: 8-12 weeks  PLANNED INTERVENTIONS (unless contraindicated): aquatic PT, Canalith repositioning, cryotherapy, Electrical stimulation, Iontophoresis with 4 mg/ml dexamethasome, Moist heat, traction, Ultrasound, gait training, Therapeutic exercise, balance training, neuromuscular re-education, patient/family education, prosthetic training, manual techniques, passive ROM, dry needling, taping, vasopnuematic device, vestibular, spinal manipulations, joint manipulations  PLAN FOR NEXT SESSION: Progress per protocol.      Asberry BRAVO Lavontae Cornia, PTA 05/04/2024, 10:45 AM

## 2024-05-13 ENCOUNTER — Ambulatory Visit (HOSPITAL_BASED_OUTPATIENT_CLINIC_OR_DEPARTMENT_OTHER)

## 2024-05-13 DIAGNOSIS — M25561 Pain in right knee: Secondary | ICD-10-CM

## 2024-05-13 DIAGNOSIS — M6281 Muscle weakness (generalized): Secondary | ICD-10-CM

## 2024-05-13 DIAGNOSIS — R2689 Other abnormalities of gait and mobility: Secondary | ICD-10-CM

## 2024-05-13 NOTE — Therapy (Signed)
 OUTPATIENT PHYSICAL THERAPY LOWER EXTREMITY TREATMENT   Patient Name: Cindy [name] MRN: [id number] DOB:09/04/1970, 53 y.o., female Today's Date: 05/13/2024  END OF SESSION:  PT End of Session - 05/13/24 0803     Visit Number 11    Number of Visits 16    Date for Recertification  05/26/24    Authorization Type [employer]    Progress Note Due on Visit 10    PT Start Time 0800    PT Stop Time 0848    PT Time Calculation (min) 48 min    Activity Tolerance Patient tolerated treatment well    Behavior During Therapy [facility] for tasks assessed/performed                   Past Medical History:  Diagnosis Date   Asthma    Hypertension    Urticaria    Past Surgical History:  Procedure Laterality Date   KNEE SURGERY Right 03/11/2024   meniscus repair   Patient Active Problem List   Diagnosis Date Noted   Seasonal and perennial allergic rhinitis 06/30/2019   Reactive airway disease 06/30/2019    PCP: Espinoza, Alejandra, DO  REFERRING PROVIDER: Genelle Standing, MD  REFERRING DIAG:  Acute medial meniscus tear of right knee, initial encounter [D16.758J]   THERAPY DIAG:  Acute pain of right knee  Muscle weakness (generalized)  Other abnormalities of gait and mobility  Rationale for Evaluation and Treatment: Rehabilitation  ONSET DATE: 03/11/24  SUBJECTIVE:   SUBJECTIVE STATEMENT:  Pt reports she came to [place] 3 days in a row last week. I felt it was too much. I was sore. Reports mild buckling when fatigued and walking around track. She reports stiffness is improved today.   Pt has a long history of  knee pain. She has had PT in the past, which did not resolve the pain. She has extensive damage in her knee per her most recent MRI. She received a meniscal repair on 03/11/24. 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   PERTINENT HISTORY: Asthma Hypertension Urticaria PAIN:  Are you having pain? Yes: NPRS scale: 2/10 with WB.  Pain  location: R knee at inferior patella.  Pain description: Dull ache, pulling when standing.  Aggravating factors: Standing, walking.  Relieving factors: Sitting, resting, medicine.   PRECAUTIONS: Knee, meniscal repair   RED FLAGS: None   WEIGHT BEARING RESTRICTIONS: Yes WBAT with AD  FALLS:  Has patient fallen in last 6 months? No  LIVING ENVIRONMENT: 11-12 stairs  OCCUPATION: Nurse  PLOF: Independent  PATIENT GOALS: decreased pain, increased confidence while walking   NEXT MD VISIT: 03/25/24  OBJECTIVE:  Note: Objective measures were completed at Evaluation unless otherwise noted.  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 No evidence of acute fracture or dislocation. No focal bone lesion or bone destruction. No significant effusions. Soft tissues are unremarkable.  PATIENT SURVEYS:  LEFS: 27/80 33.75%   COGNITION: Overall cognitive status: Within functional limits for tasks assessed     SENSATION: WFL  POSTURE: No Significant postural limitations  PALPATION: Tenderness at surgical site.   LOWER EXTREMITY ROM:  Passive ROM Right eval Left eval Right  Right 12/9  Hip flexion      Hip extension      Hip abduction      Hip adduction      Hip internal rotation  Hip external rotation      Knee flexion 78 degrees  0 106 118  Knee extension   -3 -3  Ankle dorsiflexion      Ankle plantarflexion      Ankle inversion      Ankle eversion       (Blank rows = not tested)  LOWER EXTREMITY MMT:  MMT Right eval Left eval  Hip flexion    Hip extension    Hip abduction    Hip adduction    Hip internal rotation    Hip external rotation    Knee flexion    Knee extension    Ankle dorsiflexion    Ankle plantarflexion    Ankle inversion    Ankle eversion     (Blank rows = not tested)  LOWER EXTREMITY SPECIAL TESTS:     FUNCTIONAL TESTS:   GAIT: Distance walked: 50ft Assistive device utilized: 1x axillary crutch  Level of assistance: Complete Independence Comments: Walks with an antalgic gait and WBAT.                                                                                                                                 TREATMENT DATE:  12/18 PROM R knee Long sit HSS 30sec x3 SLR 1x10 SLR on elbows 2x10 S/l hip abduction 2x15 Bridges 3x10 LAQ 3# 5 2x10 Scifit bike x66min L3 Step ups 4 x10, 6 2x10 R Heel raises 2x15 Partial squats 2x10 Partial lunges x10ea  12/9 PROM R knee 118 deg active flexion 3deg lacking extension Heel prop with manual overpressure SLR 2x10 S/l hip abduction 2x15 Bridges 2x10 LAQ 3# 5 2x10 STS  2x10 Scifit bike x2min L3 Step ups 4 x5, 6 2x10 R  12/2 PROM R knee Heel prop with manual overpressure Quad sets 5 x15 SLR 2x10 S/l hip abduction 2x10 Bridges 2x10 Prone TKE 5 2x10 LAQ 5 2x10 STS  2x10 Standing HR 3x10 Scifit bike x64min L3 Step ups 4 2x10 fwd and lateral  11/26 PROM R knee Heel prop with manual overpressure Quad sets 5 x15 SLR 2x10 Prone TKE 5 2x10 LAQ 5 2x10 STS with brace doffed 2x10 Standing HR 2x10 Tandem balance 3x20sec R posterior Scifit bike x51min L3 Step ups 4 2x10     11/24  STM to posterior knee/distal HS PROM R knee Quad sets Prone TKE x5 Russian Estim 10/10 x55min Long sit HSS 3x30sec  Sci fit bike x57min full LAQ 2x10 STS with brace donned 3x10    11/20 Manual: Trigger point release to quad Hamstring release  Extension stretch with distraction  Grade II and III PA and AP mobilization   There-ex:  PROM into flexion  Quad sets x15 SLR with brace on 3x10  SL SLR 3x10 low RPE on 1st set so added 1 lb weight  SAQ 3x10 low RPE without weight so added 1 lb weight for last 2 sets   There-act:  Step  up  3x10 2 inch   Lateral step up  3x10 2 inch     11/17 There-ex:   PROM into flexion  Quad sets 3x12  SLR with brace on 3x10  SL SLR 3x10   Manual: Trigger point release to quad Hamstring release  Extension stretch with distraction  Grade II and III PA and AP mobilization   There-act:  Step up  3x10 2 inch   Lateral step up  3x10 2 inch    PATIENT EDUCATION:  Education details: Educated pt on anatomy and physiology of current symptoms, LEFS, diagnosis, prognosis, HEP,  and POC. Person educated: Patient Education method: Explanation, Demonstration, Verbal cues, and Handouts Education comprehension: verbalized understanding and returned demonstration  HOME EXERCISE PROGRAM: Access Code: [email or web address] URL: [email or web address] Date: 03/15/2024 Prepared by: Rojean Batten  Exercises - Supine Quad Set  - 1-2 x daily - 7 x weekly - 2-3 sets - 10 reps - Supine Active Straight Leg Raise  - 1-2 x daily - 7 x weekly - 2-3 sets - 10 reps - Supine Heel Slide with Strap  - 1-2 x daily - 7 x weekly - 2-3 sets - 10 reps  ASSESSMENT:  CLINICAL IMPRESSION:  Pt 9 weeks s/p today. She continues to be limited in knee extension ROM, but progressing with knee flexion. Continued to work on strengthening in clinic within tolerance. Mild increase in discomfort with partial lunges today. Instructed pt not to push past pain limits. Increased difficulty with SLR to include SLR on elbows. Pt to continue to work on knee extension at home. Will progress as tolerated.    Eval: Patient is a 53 y.o. F who was seen today for physical therapy evaluation and treatment for S/P R meniscal repair . No signs of infection or DVT's today. Surgical site looks clean. Applied InteguDerm and guaze over stitches. Provided pt with extra's. Pt tolerated session well today. She was limited by pain and had a few lower back spasms. Patient will benefit from skilled PT to address below impairments, limitations and improve overall function.   ACTIVITY LIMITATIONS: bending, lifting,  carry, locomotion, cleaning, community activity, driving, and or occupation  PERSONAL FACTORS: Asthma, Hypertension, Urticaria. Hx of ongoing knee pain are also affecting patient's functional outcome.  REHAB POTENTIAL: Good  CLINICAL DECISION MAKING: Stable/uncomplicated  EVALUATION COMPLEXITY: Low    GOALS: Short term PT Goals Target date: 03/29/24 Pt will be I and compliant with HEP. Baseline:  Goal status: New Pt will decrease pain by 25% overall Baseline: Goal status: MET 12/18  Long term PT goals Target date: 05/26/24 Pt will improve ROM to [facility] to improve functional mobility Baseline: Goal status: NIN PROGRESS  Pt will improve  hip/knee strength to at least 5-/5 MMT to improve functional strength Baseline: Goal status: New Pt will improve LEFS by 9 points to show improved function Baseline:27 Goal status: New Pt will reduce pain by overall 50% overall with usual activity Baseline: Goal status: New Pt will reduce pain to overall less than 2-3/10 with usual activity and work activity. Baseline: Goal status: New Pt will be able to ambulate community distances at least 1000 ft WNL gait pattern without complaints Baseline: Goal status: New  PLAN: PT FREQUENCY: 1-2 times per week   PT DURATION: 8-12 weeks  PLANNED INTERVENTIONS (unless contraindicated): aquatic PT, Canalith repositioning, cryotherapy, Electrical stimulation, Iontophoresis with 4 mg/ml dexamethasome, Moist heat, traction, Ultrasound, gait training, Therapeutic exercise, balance training, neuromuscular re-education, patient/family education, prosthetic training, manual techniques, passive ROM,  dry needling, taping, vasopnuematic device, vestibular, spinal manipulations, joint manipulations  PLAN FOR NEXT SESSION: Progress per protocol.      [place] BRAVO Traven Davids, PTA 05/13/2024, 8:56 AM

## 2024-05-15 ENCOUNTER — Ambulatory Visit (HOSPITAL_BASED_OUTPATIENT_CLINIC_OR_DEPARTMENT_OTHER): Admitting: Physical Therapy

## 2024-05-15 DIAGNOSIS — M6281 Muscle weakness (generalized): Secondary | ICD-10-CM

## 2024-05-15 DIAGNOSIS — M25561 Pain in right knee: Secondary | ICD-10-CM

## 2024-05-15 DIAGNOSIS — R2689 Other abnormalities of gait and mobility: Secondary | ICD-10-CM

## 2024-05-15 NOTE — Therapy (Signed)
 " OUTPATIENT PHYSICAL THERAPY LOWER EXTREMITY TREATMENT   Patient Name: Cindy [name] MRN: [id number] DOB:[birth year], 53 y.o., female Today's Date: 05/15/2024  END OF SESSION:  PT End of Session - 05/15/24 0900     Visit Number 12    Number of Visits 16    Date for Recertification  05/26/24    Authorization Type [employer]    Progress Note Due on Visit 10    PT Start Time 0830    PT Stop Time 0912    PT Time Calculation (min) 42 min    Activity Tolerance Patient tolerated treatment well    Behavior During Therapy [facility] for tasks assessed/performed                    Past Medical History:  Diagnosis Date   Asthma    Hypertension    Urticaria    Past Surgical History:  Procedure Laterality Date   KNEE SURGERY Right 03/11/2024   meniscus repair   Patient Active Problem List   Diagnosis Date Noted   Seasonal and perennial allergic rhinitis 06/30/2019   Reactive airway disease 06/30/2019    PCP: Espinoza, Alejandra, DO  REFERRING PROVIDER: Genelle Standing, MD  REFERRING DIAG:  Acute medial meniscus tear of right knee, initial encounter [D16.758J]   THERAPY DIAG:  Acute pain of right knee  Muscle weakness (generalized)  Other abnormalities of gait and mobility  Rationale for Evaluation and Treatment: Rehabilitation  ONSET DATE: 03/11/24  SUBJECTIVE:   SUBJECTIVE STATEMENT:  Pt reports she came to [place] 3 days in a row last week. I felt it was too much. I was sore. Reports mild buckling when fatigued and walking around track. She reports stiffness is improved today.   Pt has a long history of  knee pain. She has had PT in the past, which did not resolve the pain. She has extensive damage in her knee per her most recent MRI. She received a meniscal repair on 03/11/24. 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   PERTINENT HISTORY: Asthma Hypertension Urticaria PAIN:  Are you having pain? Yes: NPRS scale: 2/10 with WB.  Pain  location: R knee at inferior patella.  Pain description: Dull ache, pulling when standing.  Aggravating factors: Standing, walking.  Relieving factors: Sitting, resting, medicine.   PRECAUTIONS: Knee, meniscal repair   RED FLAGS: None   WEIGHT BEARING RESTRICTIONS: Yes WBAT with AD  FALLS:  Has patient fallen in last 6 months? No  LIVING ENVIRONMENT: 11-12 stairs  OCCUPATION: Nurse  PLOF: Independent  PATIENT GOALS: decreased pain, increased confidence while walking   NEXT MD VISIT: 03/25/24  OBJECTIVE:  Note: Objective measures were completed at Evaluation unless otherwise noted.  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 No evidence of acute fracture or dislocation. No focal bone lesion or bone destruction. No significant effusions. Soft tissues are unremarkable.  PATIENT SURVEYS:  LEFS: 27/80 33.75%   COGNITION: Overall cognitive status: Within functional limits for tasks assessed     SENSATION: WFL  POSTURE: No Significant postural limitations  PALPATION: Tenderness at surgical site.   LOWER EXTREMITY ROM:  Passive ROM Right eval Left eval Right  Right 12/9  Hip flexion      Hip extension      Hip abduction      Hip adduction      Hip internal  rotation      Hip external rotation      Knee flexion 78 degrees  0 106 118  Knee extension   -3 -3  Ankle dorsiflexion      Ankle plantarflexion      Ankle inversion      Ankle eversion       (Blank rows = not tested)  LOWER EXTREMITY MMT:  MMT Right eval Left eval  Hip flexion    Hip extension    Hip abduction    Hip adduction    Hip internal rotation    Hip external rotation    Knee flexion    Knee extension    Ankle dorsiflexion    Ankle plantarflexion    Ankle inversion    Ankle eversion     (Blank rows = not tested)  LOWER EXTREMITY SPECIAL TESTS:     FUNCTIONAL TESTS:   GAIT: Distance walked: 70ft Assistive device utilized: 1x axillary crutch  Level of assistance: Complete Independence Comments: Walks with an antalgic gait and WBAT.                                                                                                                                 TREATMENT DATE: 05/15/24 Scifit bike x70min L3 Contract relax with OP into extension Attempted leg extension with 5lbs with pt reporting pain Shuttle with 75lbs, dbl leg press  1 lap around clinic with focus on HS stretch and heel to toe gait pattern HS/ Adductor stretch in supine LAQ 4# 5 2x10 Heel raises 1x8 Partial squats 2x10 Partial lunges x15ea, with PT pulling GTB into abduction for increased abductor and glute engagement    12/18 PROM R knee Long sit HSS 30sec x3 SLR 1x10 SLR on elbows 2x10 S/l hip abduction 2x15 Bridges 3x10 LAQ 3# 5 2x10 Scifit bike x48min L3 Step ups 4 x10, 6 2x10 R Heel raises 2x15 Partial squats 2x10 Partial lunges x10ea  12/9 PROM R knee 118 deg active flexion 3deg lacking extension Heel prop with manual overpressure SLR 2x10 S/l hip abduction 2x15 Bridges 2x10 LAQ 3# 5 2x10 STS  2x10 Scifit bike x27min L3 Step ups 4 x5, 6 2x10 R  12/2 PROM R knee Heel prop with manual overpressure Quad sets 5 x15 SLR 2x10 S/l hip abduction 2x10 Bridges 2x10 Prone TKE 5 2x10 LAQ 5 2x10 STS  2x10 Standing HR 3x10 Scifit bike x62min L3 Step ups 4 2x10 fwd and lateral  11/26 PROM R knee Heel prop with manual overpressure Quad sets 5 x15 SLR 2x10 Prone TKE 5 2x10 LAQ 5 2x10 STS with brace doffed 2x10 Standing HR 2x10 Tandem balance 3x20sec R posterior Scifit bike x21min L3 Step ups 4 2x10     11/24  STM to posterior knee/distal HS PROM R knee Quad sets Prone TKE x5 Russian Estim 10/10 x50min Long sit HSS 3x30sec  Sci fit bike x29min full LAQ 2x10 STS with  brace donned 3x10    PATIENT  EDUCATION:  Education details: Educated pt on anatomy and physiology of current symptoms, LEFS, diagnosis, prognosis, HEP,  and POC. Person educated: Patient Education method: Explanation, Demonstration, Verbal cues, and Handouts Education comprehension: verbalized understanding and returned demonstration  HOME EXERCISE PROGRAM: Access Code: [email or web address] URL: [email or web address] Date: 03/15/2024 Prepared by: Rojean Batten  Exercises - Supine Quad Set  - 1-2 x daily - 7 x weekly - 2-3 sets - 10 reps - Supine Active Straight Leg Raise  - 1-2 x daily - 7 x weekly - 2-3 sets - 10 reps - Supine Heel Slide with Strap  - 1-2 x daily - 7 x weekly - 2-3 sets - 10 reps  ASSESSMENT:  CLINICAL IMPRESSION:  Pt 9 weeks s/p today. She continues to be limited in knee extension ROM, but made improvements today following manual. Continued to work on strengthening in clinic within tolerance. Attempted to perform knee extension on machine, with pt reporting increased pain at end range knee ext. Instructed pt not to push past pain limits. Improvements in overall tolerance to strength training and lunges. She needs to continue working on quad control. Pt to continue to work on knee extension at home. Will progress as tolerated.    Eval: Patient is a 53 y.o. F who was seen today for physical therapy evaluation and treatment for S/P R meniscal repair . No signs of infection or DVT's today. Surgical site looks clean. Applied InteguDerm and guaze over stitches. Provided pt with extra's. Pt tolerated session well today. She was limited by pain and had a few lower back spasms. Patient will benefit from skilled PT to address below impairments, limitations and improve overall function.   ACTIVITY LIMITATIONS: bending, lifting, carry, locomotion, cleaning, community activity, driving, and or occupation  PERSONAL FACTORS: Asthma, Hypertension, Urticaria. Hx of ongoing knee pain are also affecting patient's  functional outcome.  REHAB POTENTIAL: Good  CLINICAL DECISION MAKING: Stable/uncomplicated  EVALUATION COMPLEXITY: Low    GOALS: Short term PT Goals Target date: 03/29/24 Pt will be I and compliant with HEP. Baseline:  Goal status: New Pt will decrease pain by 25% overall Baseline: Goal status: MET 12/18  Long term PT goals Target date: 05/26/24 Pt will improve ROM to [facility] to improve functional mobility Baseline: Goal status: NIN PROGRESS  Pt will improve  hip/knee strength to at least 5-/5 MMT to improve functional strength Baseline: Goal status: New Pt will improve LEFS by 9 points to show improved function Baseline:27 Goal status: New Pt will reduce pain by overall 50% overall with usual activity Baseline: Goal status: New Pt will reduce pain to overall less than 2-3/10 with usual activity and work activity. Baseline: Goal status: New Pt will be able to ambulate community distances at least 1000 ft WNL gait pattern without complaints Baseline: Goal status: New  PLAN: PT FREQUENCY: 1-2 times per week   PT DURATION: 8-12 weeks  PLANNED INTERVENTIONS (unless contraindicated): aquatic PT, Canalith repositioning, cryotherapy, Electrical stimulation, Iontophoresis with 4 mg/ml dexamethasome, Moist heat, traction, Ultrasound, gait training, Therapeutic exercise, balance training, neuromuscular re-education, patient/family education, prosthetic training, manual techniques, passive ROM, dry needling, taping, vasopnuematic device, vestibular, spinal manipulations, joint manipulations  PLAN FOR NEXT SESSION: Progress per protocol.      Rojean JONELLE Batten, PT 05/15/2024, 9:21 AM  "

## 2024-05-18 ENCOUNTER — Encounter (HOSPITAL_BASED_OUTPATIENT_CLINIC_OR_DEPARTMENT_OTHER): Admitting: Physical Therapy

## 2024-05-18 ENCOUNTER — Encounter (HOSPITAL_BASED_OUTPATIENT_CLINIC_OR_DEPARTMENT_OTHER): Payer: Self-pay | Admitting: Physical Therapy

## 2024-05-18 DIAGNOSIS — R2689 Other abnormalities of gait and mobility: Secondary | ICD-10-CM

## 2024-05-18 DIAGNOSIS — M6281 Muscle weakness (generalized): Secondary | ICD-10-CM

## 2024-05-18 DIAGNOSIS — M25561 Pain in right knee: Secondary | ICD-10-CM | POA: Diagnosis not present

## 2024-05-18 NOTE — Therapy (Signed)
 " OUTPATIENT PHYSICAL THERAPY LOWER EXTREMITY TREATMENT/ Re-Cert   Progress Note Reporting Period 03/15/24 to 05/18/24  See note below for Objective Data and Assessment of Progress/Goals.      Patient Name: Cindy [name] MRN: [id number] DOB:17-Sep-1970, 53 y.o., female Today's Date: 05/18/2024  END OF SESSION:  PT End of Session - 05/18/24 0808     Visit Number 13    Number of Visits 16    Date for Recertification  07/25/24    Authorization Type [employer]    Progress Note Due on Visit 10    PT Start Time 0800    PT Stop Time [phone]    PT Time Calculation (min) 42 min    Activity Tolerance Patient tolerated treatment well    Behavior During Therapy [facility] for tasks assessed/performed            Past Medical History:  Diagnosis Date   Asthma    Hypertension    Urticaria    Past Surgical History:  Procedure Laterality Date   KNEE SURGERY Right 03/11/2024   meniscus repair   Patient Active Problem List   Diagnosis Date Noted   Seasonal and perennial allergic rhinitis 06/30/2019   Reactive airway disease 06/30/2019    PCP: Espinoza, Alejandra, DO  REFERRING PROVIDER: Genelle Standing, MD  REFERRING DIAG:  Acute medial meniscus tear of right knee, initial encounter [D16.758J]   THERAPY DIAG:  Acute pain of right knee  Muscle weakness (generalized)  Other abnormalities of gait and mobility  Rationale for Evaluation and Treatment: Rehabilitation  ONSET DATE: 03/11/24  SUBJECTIVE:   SUBJECTIVE STATEMENT:  I am having a little pain here. Points to medial side of knee.   Pt has a long history of  knee pain. She has had PT in the past, which did not resolve the pain. She has extensive damage in her knee per her most recent MRI. She received a meniscal repair on 03/11/24. 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   PERTINENT HISTORY: Asthma Hypertension Urticaria PAIN:  Are you having pain? Yes: NPRS scale: 2/10 with WB.  Pain location: R  knee at inferior patella.  Pain description: Dull ache, pulling when standing.  Aggravating factors: Standing, walking.  Relieving factors: Sitting, resting, medicine.   PRECAUTIONS: Knee, meniscal repair   RED FLAGS: None   WEIGHT BEARING RESTRICTIONS: Yes WBAT with AD  FALLS:  Has patient fallen in last 6 months? No  LIVING ENVIRONMENT: 11-12 stairs  OCCUPATION: Nurse  PLOF: Independent  PATIENT GOALS: decreased pain, increased confidence while walking   NEXT MD VISIT: 03/25/24  OBJECTIVE:  Note: Objective measures were completed at Evaluation unless otherwise noted.  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 No evidence of acute fracture or dislocation. No focal bone lesion or bone destruction. No significant effusions. Soft tissues are unremarkable.  PATIENT SURVEYS:  LEFS: 27/80 33.75%  05/18/24:  46 / 80 = 57.5 %  COGNITION: Overall cognitive status: Within functional limits for tasks assessed     SENSATION: WFL  POSTURE: No Significant postural limitations  PALPATION: Tenderness at surgical site.   LOWER EXTREMITY ROM:  Passive ROM Right eval Left eval Right  Right 12/9 Right  12/23  Hip flexion       Hip extension       Hip abduction       Hip adduction  Hip internal rotation       Hip external rotation       Knee flexion 78 degrees  0 106 118 120  Knee extension   -3 -3 -2  Ankle dorsiflexion       Ankle plantarflexion       Ankle inversion       Ankle eversion        (Blank rows = not tested)  LOWER EXTREMITY MMT:  MMT Right 12/23 Left eval  Hip flexion 4-   Hip extension 3+   Hip abduction    Hip adduction    Hip internal rotation    Hip external rotation    Knee flexion    Knee extension    Ankle dorsiflexion    Ankle plantarflexion    Ankle inversion    Ankle eversion     (Blank  rows = not tested)  LOWER EXTREMITY SPECIAL TESTS:    FUNCTIONAL TESTS:   GAIT: Distance walked: 70ft Assistive device utilized: 1x axillary crutch  Level of assistance: Complete Independence Comments: Walks with an antalgic gait and WBAT.                                                                                                                                 TREATMENT DATE: 05/18/24 Scifit bike x90min L3 Patella mobs and OP into extension Seated HS stretch  Partial squats 2x10 with GTB  Prone HS curls with YTB resistance Supine SL bridges Supine HS roll in/out with foam roller   05/15/24 Contract relax with OP into extension Attempted leg extension with 5lbs with pt reporting pain Shuttle with 75lbs, dbl leg press  1 lap around clinic with focus on HS stretch and heel to toe gait pattern HS/ Adductor stretch in supine LAQ 4# 5 2x10 Heel raises 1x8 Partial squats 2x10 Partial lunges x15ea, with PT pulling GTB into abduction for increased abductor and glute engagement    12/18 PROM R knee Long sit HSS 30sec x3 SLR 1x10 SLR on elbows 2x10 S/l hip abduction 2x15 Bridges 3x10 LAQ 3# 5 2x10 Scifit bike x52min L3 Step ups 4 x10, 6 2x10 R Heel raises 2x15 Partial squats 2x10 Partial lunges x10ea  12/9 PROM R knee 118 deg active flexion 3deg lacking extension Heel prop with manual overpressure SLR 2x10 S/l hip abduction 2x15 Bridges 2x10 LAQ 3# 5 2x10 STS  2x10 Scifit bike x65min L3 Step ups 4 x5, 6 2x10 R  12/2 PROM R knee Heel prop with manual overpressure Quad sets 5 x15 SLR 2x10 S/l hip abduction 2x10 Bridges 2x10 Prone TKE 5 2x10 LAQ 5 2x10 STS  2x10 Standing HR 3x10 Scifit bike x20min L3 Step ups 4 2x10 fwd and lateral   PATIENT EDUCATION:  Education details: Educated pt on anatomy and physiology of current symptoms, LEFS, diagnosis, prognosis, HEP,  and POC. Person educated: Patient Education method: Explanation,  Demonstration, Verbal cues, and Handouts Education  comprehension: verbalized understanding and returned demonstration  HOME EXERCISE PROGRAM: Access Code: [email or web address] URL: [email or web address] Date: 03/15/2024 Prepared by: Rojean Batten  Exercises - Supine Quad Set  - 1-2 x daily - 7 x weekly - 2-3 sets - 10 reps - Supine Active Straight Leg Raise  - 1-2 x daily - 7 x weekly - 2-3 sets - 10 reps - Supine Heel Slide with Strap  - 1-2 x daily - 7 x weekly - 2-3 sets - 10 reps  ASSESSMENT:  CLINICAL IMPRESSION:  Pt 9 weeks and 5 days s/p today. She continues to be limited in knee extension ROM, but does well following hands on interventions. Pt has made great progress since her start of PT, she would benefit from increased posterior chain recruitment. Continued to work on strengthening in clinic within tolerance. Will progress as tolerated.    Eval: Patient is a 53 y.o. F who was seen today for physical therapy evaluation and treatment for S/P R meniscal repair . No signs of infection or DVT's today. Surgical site looks clean. Applied InteguDerm and guaze over stitches. Provided pt with extra's. Pt tolerated session well today. She was limited by pain and had a few lower back spasms. Patient will benefit from skilled PT to address below impairments, limitations and improve overall function.   ACTIVITY LIMITATIONS: bending, lifting, carry, locomotion, cleaning, community activity, driving, and or occupation  PERSONAL FACTORS: Asthma, Hypertension, Urticaria. Hx of ongoing knee pain are also affecting patient's functional outcome.  REHAB POTENTIAL: Good  CLINICAL DECISION MAKING: Stable/uncomplicated  EVALUATION COMPLEXITY: Low    GOALS: Short term PT Goals Target date: 03/29/24 Pt will be I and compliant with HEP. Baseline:  Goal status: New Pt will decrease pain by 25% overall Baseline: Goal status: MET 12/18  Long term PT goals Target date: POC date  Pt will  improve ROM to [facility] to improve functional mobility Baseline: Goal status: Met 05/18/24 Pt will improve  hip/knee strength to at least 5-/5 MMT to improve functional strength Baseline: Goal status: PROGRESSING 12/23 Pt will improve LEFS by 9 points to show improved function Baseline:27 Goal status: Met 05/18/24 Pt will reduce pain by overall 50% overall with usual activity Baseline: Goal status: ONGOING 05/18/24 Pt will reduce pain to overall less than 2-3/10 with usual activity and work activity. Baseline: Goal status: MET 05/18/24 Pt will be able to ambulate community distances at least 1000 ft WNL gait pattern without complaints Baseline: Goal status: MET 05/18/24  PLAN: PT FREQUENCY: 1-2 times per week   PT DURATION: 6-8 weeks   PLANNED INTERVENTIONS (unless contraindicated): aquatic PT, Canalith repositioning, cryotherapy, Electrical stimulation, Iontophoresis with 4 mg/ml dexamethasome, Moist heat, traction, Ultrasound, gait training, Therapeutic exercise, balance training, neuromuscular re-education, patient/family education, prosthetic training, manual techniques, passive ROM, dry needling, taping, vasopnuematic device, vestibular, spinal manipulations, joint manipulations  PLAN FOR NEXT SESSION: Progress per protocol. Increase posterior chain recruitment.      Rojean JONELLE Batten, PT 05/18/2024, 11:31 AM  "

## 2024-05-25 ENCOUNTER — Encounter (HOSPITAL_BASED_OUTPATIENT_CLINIC_OR_DEPARTMENT_OTHER): Payer: Self-pay

## 2024-05-25 ENCOUNTER — Ambulatory Visit (HOSPITAL_BASED_OUTPATIENT_CLINIC_OR_DEPARTMENT_OTHER)

## 2024-05-25 DIAGNOSIS — M25561 Pain in right knee: Secondary | ICD-10-CM | POA: Diagnosis not present

## 2024-05-25 DIAGNOSIS — M6281 Muscle weakness (generalized): Secondary | ICD-10-CM

## 2024-05-25 DIAGNOSIS — R2689 Other abnormalities of gait and mobility: Secondary | ICD-10-CM

## 2024-05-25 NOTE — Therapy (Signed)
 " OUTPATIENT PHYSICAL THERAPY LOWER EXTREMITY TREATMENT/      Patient Name: Cindy [name] MRN: [id number] DOB:06/23/1970, 53 y.o., female Today's Date: 05/25/2024  END OF SESSION:  PT End of Session - 05/25/24 0848     Visit Number 14    Number of Visits 16    Date for Recertification  07/25/24    Authorization Type [employer]    Progress Note Due on Visit 10    PT Start Time [phone]    PT Stop Time 0929    PT Time Calculation (min) 43 min    Activity Tolerance Patient tolerated treatment well    Behavior During Therapy [facility] for tasks assessed/performed             Past Medical History:  Diagnosis Date   Asthma    Hypertension    Urticaria    Past Surgical History:  Procedure Laterality Date   KNEE SURGERY Right 03/11/2024   meniscus repair   Patient Active Problem List   Diagnosis Date Noted   Seasonal and perennial allergic rhinitis 06/30/2019   Reactive airway disease 06/30/2019    PCP: Espinoza, Alejandra, DO  REFERRING PROVIDER: Genelle Standing, MD  REFERRING DIAG:  Acute medial meniscus tear of right knee, initial encounter [D16.758J]   THERAPY DIAG:  Acute pain of right knee  Muscle weakness (generalized)  Other abnormalities of gait and mobility  Rationale for Evaluation and Treatment: Rehabilitation  ONSET DATE: 03/11/24  SUBJECTIVE:   SUBJECTIVE STATEMENT:  Pt reports no pain at entry, only tightness.   Pt has a long history of  knee pain. She has had PT in the past, which did not resolve the pain. She has extensive damage in her knee per her most recent MRI. She received a meniscal repair on 03/11/24. 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   PERTINENT HISTORY: Asthma Hypertension Urticaria PAIN:  Are you having pain? Yes: NPRS scale: 2/10 with WB.  Pain location: R knee at inferior patella.  Pain description: Dull ache, pulling when standing.  Aggravating factors: Standing, walking.  Relieving factors: Sitting,  resting, medicine.   PRECAUTIONS: Knee, meniscal repair   RED FLAGS: None   WEIGHT BEARING RESTRICTIONS: Yes WBAT with AD  FALLS:  Has patient fallen in last 6 months? No  LIVING ENVIRONMENT: 11-12 stairs  OCCUPATION: Nurse  PLOF: Independent  PATIENT GOALS: decreased pain, increased confidence while walking   NEXT MD VISIT: 03/25/24  OBJECTIVE:  Note: Objective measures were completed at Evaluation unless otherwise noted.  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 No evidence of acute fracture or dislocation. No focal bone lesion or bone destruction. No significant effusions. Soft tissues are unremarkable.  PATIENT SURVEYS:  LEFS: 27/80 33.75%  05/18/24:  46 / 80 = 57.5 %  COGNITION: Overall cognitive status: Within functional limits for tasks assessed     SENSATION: WFL  POSTURE: No Significant postural limitations  PALPATION: Tenderness at surgical site.   LOWER EXTREMITY ROM:  Passive ROM Right eval Left eval Right  Right 12/9 Right  12/23  Hip flexion       Hip extension       Hip abduction       Hip adduction       Hip internal rotation       Hip external rotation       Knee flexion  78 degrees  0 106 118 120  Knee extension   -3 -3 -2  Ankle dorsiflexion       Ankle plantarflexion       Ankle inversion       Ankle eversion        (Blank rows = not tested)  LOWER EXTREMITY MMT:  MMT Right 12/23 Left eval  Hip flexion 4-   Hip extension 3+   Hip abduction    Hip adduction    Hip internal rotation    Hip external rotation    Knee flexion    Knee extension    Ankle dorsiflexion    Ankle plantarflexion    Ankle inversion    Ankle eversion     (Blank rows = not tested)  LOWER EXTREMITY SPECIAL TESTS:    FUNCTIONAL TESTS:   GAIT: Distance walked: 27ft Assistive device utilized: 1x axillary  crutch  Level of assistance: Complete Independence Comments: Walks with an antalgic gait and WBAT.                                                                                                                                 TREATMENT DATE:  12/30 Scifit bike x48min L3 Patella mobs  Roller to R adductor mm PROM R knee SLR 3x10 Supine SL bridge 3x10 S/l hip abduction 2x15 LAQ 4# 3 hold 3x10 HSC EOB GTB 3x10 Single HR 2x10R Step ups 6 2x10 with cues for glute max engagement  DL 89oa KB 7k89 (at wall) Goblet squat taps to plinth 10lb KB 2x10     05/18/24 Scifit bike x42min L3 Patella mobs and OP into extension Seated HS stretch  Partial squats 2x10 with GTB  Prone HS curls with YTB resistance Supine SL bridges Supine HS roll in/out with foam roller   05/15/24 Contract relax with OP into extension Attempted leg extension with 5lbs with pt reporting pain Shuttle with 75lbs, dbl leg press  1 lap around clinic with focus on HS stretch and heel to toe gait pattern HS/ Adductor stretch in supine LAQ 4# 5 2x10 Heel raises 1x8 Partial squats 2x10 Partial lunges x15ea, with PT pulling GTB into abduction for increased abductor and glute engagement    12/18 PROM R knee Long sit HSS 30sec x3 SLR 1x10 SLR on elbows 2x10 S/l hip abduction 2x15 Bridges 3x10 LAQ 3# 5 2x10 Scifit bike x67min L3 Step ups 4 x10, 6 2x10 R Heel raises 2x15 Partial squats 2x10 Partial lunges x10ea  12/9 PROM R knee 118 deg active flexion 3deg lacking extension Heel prop with manual overpressure SLR 2x10 S/l hip abduction 2x15 Bridges 2x10 LAQ 3# 5 2x10 STS  2x10 Scifit bike x13min L3 Step ups 4 x5, 6 2x10 R  12/2 PROM R knee Heel prop with manual overpressure Quad sets 5 x15 SLR 2x10 S/l hip abduction 2x10 Bridges 2x10 Prone TKE 5 2x10 LAQ 5 2x10 STS  2x10 Standing HR  3x10 Scifit bike x49min L3 Step ups 4 2x10 fwd and lateral   PATIENT EDUCATION:  Education  details: Educated pt on anatomy and physiology of current symptoms, LEFS, diagnosis, prognosis, HEP,  and POC. Person educated: Patient Education method: Explanation, Demonstration, Verbal cues, and Handouts Education comprehension: verbalized understanding and returned demonstration  HOME EXERCISE PROGRAM: Access Code: [email or web address] URL: [email or web address] Date: 03/15/2024 Prepared by: Rojean Batten  Exercises - Supine Quad Set  - 1-2 x daily - 7 x weekly - 2-3 sets - 10 reps - Supine Active Straight Leg Raise  - 1-2 x daily - 7 x weekly - 2-3 sets - 10 reps - Supine Heel Slide with Strap  - 1-2 x daily - 7 x weekly - 2-3 sets - 10 reps  ASSESSMENT:  CLINICAL IMPRESSION:  Continued to work on knee ROM and strengthening today with good tolerance. Performed IASTM using roller tool to address ongoing tightness into R adductor mm. Pt very tender here. Focused on posterior chain strength especially related to functional activity. Cues provided with step up exercises for NM activation of gluteal mm. Pt still lacking very end range knee flexion which continues to feel tight to her. Will continue to progress ROM and strengthening as tolerated.    Eval: Patient is a 53 y.o. F who was seen today for physical therapy evaluation and treatment for S/P R meniscal repair . No signs of infection or DVT's today. Surgical site looks clean. Applied InteguDerm and guaze over stitches. Provided pt with extra's. Pt tolerated session well today. She was limited by pain and had a few lower back spasms. Patient will benefit from skilled PT to address below impairments, limitations and improve overall function.   ACTIVITY LIMITATIONS: bending, lifting, carry, locomotion, cleaning, community activity, driving, and or occupation  PERSONAL FACTORS: Asthma, Hypertension, Urticaria. Hx of ongoing knee pain are also affecting patient's functional outcome.  REHAB POTENTIAL: Good  CLINICAL DECISION MAKING:  Stable/uncomplicated  EVALUATION COMPLEXITY: Low    GOALS: Short term PT Goals Target date: 03/29/24 Pt will be I and compliant with HEP. Baseline:  Goal status: New Pt will decrease pain by 25% overall Baseline: Goal status: MET 12/18  Long term PT goals Target date: POC date  Pt will improve ROM to [facility] to improve functional mobility Baseline: Goal status: Met 05/18/24 Pt will improve  hip/knee strength to at least 5-/5 MMT to improve functional strength Baseline: Goal status: PROGRESSING 12/23 Pt will improve LEFS by 9 points to show improved function Baseline:27 Goal status: Met 05/18/24 Pt will reduce pain by overall 50% overall with usual activity Baseline: Goal status: ONGOING 05/18/24 Pt will reduce pain to overall less than 2-3/10 with usual activity and work activity. Baseline: Goal status: MET 05/18/24 Pt will be able to ambulate community distances at least 1000 ft WNL gait pattern without complaints Baseline: Goal status: MET 05/18/24  PLAN: PT FREQUENCY: 1-2 times per week   PT DURATION: 6-8 weeks   PLANNED INTERVENTIONS (unless contraindicated): aquatic PT, Canalith repositioning, cryotherapy, Electrical stimulation, Iontophoresis with 4 mg/ml dexamethasome, Moist heat, traction, Ultrasound, gait training, Therapeutic exercise, balance training, neuromuscular re-education, patient/family education, prosthetic training, manual techniques, passive ROM, dry needling, taping, vasopnuematic device, vestibular, spinal manipulations, joint manipulations  PLAN FOR NEXT SESSION: Progress per protocol. Increase posterior chain recruitment.      [place]berry BRAVO Kavian Peters, PTA 05/25/2024, 9:34 AM  "

## 2024-05-26 ENCOUNTER — Ambulatory Visit (INDEPENDENT_AMBULATORY_CARE_PROVIDER_SITE_OTHER)

## 2024-05-26 DIAGNOSIS — J309 Allergic rhinitis, unspecified: Secondary | ICD-10-CM

## 2024-05-26 DIAGNOSIS — J3089 Other allergic rhinitis: Secondary | ICD-10-CM | POA: Diagnosis not present

## 2024-06-02 ENCOUNTER — Ambulatory Visit (HOSPITAL_BASED_OUTPATIENT_CLINIC_OR_DEPARTMENT_OTHER): Attending: Sports Medicine

## 2024-06-02 ENCOUNTER — Encounter (HOSPITAL_BASED_OUTPATIENT_CLINIC_OR_DEPARTMENT_OTHER): Payer: Self-pay

## 2024-06-02 DIAGNOSIS — M6281 Muscle weakness (generalized): Secondary | ICD-10-CM | POA: Diagnosis present

## 2024-06-02 DIAGNOSIS — R2689 Other abnormalities of gait and mobility: Secondary | ICD-10-CM | POA: Diagnosis present

## 2024-06-02 DIAGNOSIS — M25561 Pain in right knee: Secondary | ICD-10-CM | POA: Diagnosis present

## 2024-06-02 NOTE — Therapy (Signed)
 " OUTPATIENT PHYSICAL THERAPY LOWER EXTREMITY TREATMENT/      Patient Name: Keshonna Valvo MRN: [id number] DOB:Jul 05, 1970, 54 y.o., female Today's Date: 06/02/2024  END OF SESSION:  PT End of Session - 06/02/24 1049     Visit Number 15    Number of Visits 16    Date for Recertification  07/25/24    Authorization Type [employer]    Progress Note Due on Visit 10    PT Start Time 1017    PT Stop Time 1100    PT Time Calculation (min) 43 min    Activity Tolerance Patient tolerated treatment well    Behavior During Therapy WFL for tasks assessed/performed              Past Medical History:  Diagnosis Date   Asthma    Hypertension    Urticaria    Past Surgical History:  Procedure Laterality Date   KNEE SURGERY Right 03/11/2024   meniscus repair   Patient Active Problem List   Diagnosis Date Noted   Seasonal and perennial allergic rhinitis 06/30/2019   Reactive airway disease 06/30/2019    PCP: Espinoza, Alejandra, DO  REFERRING PROVIDER: Genelle Standing, MD  REFERRING DIAG:  Acute medial meniscus tear of right knee, initial encounter [D16.758J]   THERAPY DIAG:  Acute pain of right knee  Muscle weakness (generalized)  Other abnormalities of gait and mobility  Rationale for Evaluation and Treatment: Rehabilitation  ONSET DATE: 03/11/24  SUBJECTIVE:   SUBJECTIVE STATEMENT:  Pt reports yesterday her knee was hurting a lot. Having sharp pains on and off since Sunday. Pain occurs when walking.   Pt has a long history of  knee pain. She has had PT in the past, which did not resolve the pain. She has extensive damage in her knee per her most recent MRI. She received a meniscal repair on 03/11/24. 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   PERTINENT HISTORY: Asthma Hypertension Urticaria PAIN:  Are you having pain? Yes: NPRS scale: 3/10 with WB.  Pain location: R knee at inferior patella.  Pain description: Dull ache, pulling when  standing.  Aggravating factors: Standing, walking.  Relieving factors: Sitting, resting, medicine.   PRECAUTIONS: Knee, meniscal repair   RED FLAGS: None   WEIGHT BEARING RESTRICTIONS: Yes WBAT with AD  FALLS:  Has patient fallen in last 6 months? No  LIVING ENVIRONMENT: 11-12 stairs  OCCUPATION: Nurse  PLOF: Independent  PATIENT GOALS: decreased pain, increased confidence while walking   NEXT MD VISIT: 03/25/24  OBJECTIVE:  Note: Objective measures were completed at Evaluation unless otherwise noted.  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 No evidence of acute fracture or dislocation. No focal bone lesion or bone destruction. No significant effusions. Soft tissues are unremarkable.  PATIENT SURVEYS:  LEFS: 27/80 33.75%  05/18/24:  46 / 80 = 57.5 %  COGNITION: Overall cognitive status: Within functional limits for tasks assessed     SENSATION: WFL  POSTURE: No Significant postural limitations  PALPATION: Tenderness at surgical site.   LOWER EXTREMITY ROM:  Passive ROM Right eval Left eval Right  Right 12/9 Right  12/23  Hip flexion       Hip extension       Hip abduction       Hip adduction       Hip internal rotation  Hip external rotation       Knee flexion 78 degrees  0 106 118 120  Knee extension   -3 -3 -2  Ankle dorsiflexion       Ankle plantarflexion       Ankle inversion       Ankle eversion        (Blank rows = not tested)  LOWER EXTREMITY MMT:  MMT Right 12/23 Left eval  Hip flexion 4-   Hip extension 3+   Hip abduction    Hip adduction    Hip internal rotation    Hip external rotation    Knee flexion    Knee extension    Ankle dorsiflexion    Ankle plantarflexion    Ankle inversion    Ankle eversion     (Blank rows = not tested)  LOWER EXTREMITY SPECIAL TESTS:    FUNCTIONAL  TESTS:   GAIT: Distance walked: 59ft Assistive device utilized: 1x axillary crutch  Level of assistance: Complete Independence Comments: Walks with an antalgic gait and WBAT.                                                                                                                                 TREATMENT DATE:  1/7 Scifit bike x41min L3 Patella mobs  STM to medial quad and adductors Ktpae for pes anserine inhibition PROM R knee Single HR 2x10R Standing hip abduction RTB 3x30ea Squats 2x10 Wall squats 2x10 Supine SL bridge 3x10  12/30 Scifit bike x41min L3 Patella mobs  Roller to R adductor mm PROM R knee SLR 3x10 Supine SL bridge 3x10 S/l hip abduction 2x15 LAQ 4# 3 hold 3x10 HSC EOB GTB 3x10 Single HR 2x10R Step ups 6 2x10 with cues for glute max engagement  DL 89oa KB 7k89 (at wall) Goblet squat taps to plinth 10lb KB 2x10     05/18/24 Scifit bike x42min L3 Patella mobs and OP into extension Seated HS stretch  Partial squats 2x10 with GTB  Prone HS curls with YTB resistance Supine SL bridges Supine HS roll in/out with foam roller   05/15/24 Contract relax with OP into extension Attempted leg extension with 5lbs with pt reporting pain Shuttle with 75lbs, dbl leg press  1 lap around clinic with focus on HS stretch and heel to toe gait pattern HS/ Adductor stretch in supine LAQ 4# 5 2x10 Heel raises 1x8 Partial squats 2x10 Partial lunges x15ea, with PT pulling GTB into abduction for increased abductor and glute engagement    12/18 PROM R knee Long sit HSS 30sec x3 SLR 1x10 SLR on elbows 2x10 S/l hip abduction 2x15 Bridges 3x10 LAQ 3# 5 2x10 Scifit bike x61min L3 Step ups 4 x10, 6 2x10 R Heel raises 2x15 Partial squats 2x10 Partial lunges x10ea  12/9 PROM R knee 118 deg active flexion 3deg lacking extension Heel prop with manual overpressure SLR 2x10 S/l hip abduction 2x15 Bridges 2x10 LAQ  3# 5 2x10 STS  2x10 Scifit bike  x72min L3 Step ups 4 x5, 6 2x10 R  12/2 PROM R knee Heel prop with manual overpressure Quad sets 5 x15 SLR 2x10 S/l hip abduction 2x10 Bridges 2x10 Prone TKE 5 2x10 LAQ 5 2x10 STS  2x10 Standing HR 3x10 Scifit bike x29min L3 Step ups 4 2x10 fwd and lateral   PATIENT EDUCATION:  Education details: Educated pt on anatomy and physiology of current symptoms, LEFS, diagnosis, prognosis, HEP,  and POC. Person educated: Patient Education method: Explanation, Demonstration, Verbal cues, and Handouts Education comprehension: verbalized understanding and returned demonstration  HOME EXERCISE PROGRAM: Access Code: [email or web address] URL: [email or web address] Date: 03/15/2024 Prepared by: Rojean Batten  Exercises - Supine Quad Set  - 1-2 x daily - 7 x weekly - 2-3 sets - 10 reps - Supine Active Straight Leg Raise  - 1-2 x daily - 7 x weekly - 2-3 sets - 10 reps - Supine Heel Slide with Strap  - 1-2 x daily - 7 x weekly - 2-3 sets - 10 reps  ASSESSMENT:  CLINICAL IMPRESSION:  Pt with limited superior/inferior patella mobility and ongoing tenderness to medial thigh musculature. Pt felt improvement with patella mobilizations. Instructed pt in self patella mobilization to perform at home.  Trialed ktape for pes anserine mm inhibition to see if this alleviates occurrence of sharp pains. She was educated in proper use and removal of tape. Pt denied knee pain with strengthening exercises today. Felt no pain with passive knee flexion.   Eval: Patient is a 54 y.o. F who was seen today for physical therapy evaluation and treatment for S/P R meniscal repair . No signs of infection or DVT's today. Surgical site looks clean. Applied InteguDerm and guaze over stitches. Provided pt with extra's. Pt tolerated session well today. She was limited by pain and had a few lower back spasms. Patient will benefit from skilled PT to address below impairments, limitations and improve overall function.    ACTIVITY LIMITATIONS: bending, lifting, carry, locomotion, cleaning, community activity, driving, and or occupation  PERSONAL FACTORS: Asthma, Hypertension, Urticaria. Hx of ongoing knee pain are also affecting patient's functional outcome.  REHAB POTENTIAL: Good  CLINICAL DECISION MAKING: Stable/uncomplicated  EVALUATION COMPLEXITY: Low    GOALS: Short term PT Goals Target date: 03/29/24 Pt will be I and compliant with HEP. Baseline:  Goal status: New Pt will decrease pain by 25% overall Baseline: Goal status: MET 12/18  Long term PT goals Target date: POC date  Pt will improve ROM to [facility] to improve functional mobility Baseline: Goal status: Met 05/18/24 Pt will improve  hip/knee strength to at least 5-/5 MMT to improve functional strength Baseline: Goal status: PROGRESSING 12/23 Pt will improve LEFS by 9 points to show improved function Baseline:27 Goal status: Met 05/18/24 Pt will reduce pain by overall 50% overall with usual activity Baseline: Goal status: ONGOING 05/18/24 Pt will reduce pain to overall less than 2-3/10 with usual activity and work activity. Baseline: Goal status: MET 05/18/24 Pt will be able to ambulate community distances at least 1000 ft WNL gait pattern without complaints Baseline: Goal status: MET 05/18/24  PLAN: PT FREQUENCY: 1-2 times per week   PT DURATION: 6-8 weeks   PLANNED INTERVENTIONS (unless contraindicated): aquatic PT, Canalith repositioning, cryotherapy, Electrical stimulation, Iontophoresis with 4 mg/ml dexamethasome, Moist heat, traction, Ultrasound, gait training, Therapeutic exercise, balance training, neuromuscular re-education, patient/family education, prosthetic training, manual techniques, passive ROM, dry needling, taping, vasopnuematic device, vestibular, spinal manipulations, joint  manipulations  PLAN FOR NEXT SESSION: Progress per protocol. Increase posterior chain recruitment.      [place] BRAVO Areesha Dehaven,  PTA 06/02/2024, 11:22 AM  "

## 2024-06-08 ENCOUNTER — Ambulatory Visit (HOSPITAL_BASED_OUTPATIENT_CLINIC_OR_DEPARTMENT_OTHER): Payer: Self-pay | Admitting: Physical Therapy

## 2024-06-08 ENCOUNTER — Encounter (HOSPITAL_BASED_OUTPATIENT_CLINIC_OR_DEPARTMENT_OTHER): Payer: Self-pay | Admitting: Physical Therapy

## 2024-06-08 DIAGNOSIS — M25561 Pain in right knee: Secondary | ICD-10-CM

## 2024-06-08 DIAGNOSIS — R2689 Other abnormalities of gait and mobility: Secondary | ICD-10-CM

## 2024-06-08 DIAGNOSIS — M6281 Muscle weakness (generalized): Secondary | ICD-10-CM

## 2024-06-08 NOTE — Therapy (Signed)
 " OUTPATIENT PHYSICAL THERAPY LOWER EXTREMITY TREATMENT/      Patient Name: Cindy [name] MRN: [id number] DOB:February 09, 1971, 54 y.o., female Today's Date: 06/09/2024  END OF SESSION:  PT End of Session - 06/08/24 1149     Visit Number 16    Number of Visits 24    Date for Recertification  08/04/24    Authorization Type [employer]    PT Start Time 1147    PT Stop Time 1227    PT Time Calculation (min) 40 min    Activity Tolerance Patient tolerated treatment well    Behavior During Therapy [facility] for tasks assessed/performed               Past Medical History:  Diagnosis Date   Asthma    Hypertension    Urticaria    Past Surgical History:  Procedure Laterality Date   KNEE SURGERY Right 03/11/2024   meniscus repair   Patient Active Problem List   Diagnosis Date Noted   Seasonal and perennial allergic rhinitis 06/30/2019   Reactive airway disease 06/30/2019    PCP: Espinoza, Alejandra, DO  REFERRING PROVIDER: Genelle Standing, MD  REFERRING DIAG:  Acute medial meniscus tear of right knee, initial encounter [D16.758J]   THERAPY DIAG:  Acute pain of right knee  Muscle weakness (generalized)  Other abnormalities of gait and mobility  Rationale for Evaluation and Treatment: Rehabilitation  ONSET DATE: 03/11/24  SUBJECTIVE:   SUBJECTIVE STATEMENT:  Pt reports having similar sharp pain to last week. Said that the tape given last time helped with the pain however the sharpness came back on Sunday and Monday. States that she has good days and bad days and is unsure if the pain is coming from the OA or surgery.   Pt has a long history of  knee pain. She has had PT in the past, which did not resolve the pain. She has extensive damage in her knee per her most recent MRI. She received a meniscal repair on 03/11/24. 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   PERTINENT HISTORY: Asthma Hypertension Urticaria PAIN:  Are you having pain? Yes: NPRS  scale: 3/10 with WB.  Pain location: R knee at inferior patella.  Pain description: Dull ache, pulling when standing.  Aggravating factors: Standing, walking.  Relieving factors: Sitting, resting, medicine.   PRECAUTIONS: Knee, meniscal repair   RED FLAGS: None   WEIGHT BEARING RESTRICTIONS: Yes WBAT with AD  FALLS:  Has patient fallen in last 6 months? No  LIVING ENVIRONMENT: 11-12 stairs  OCCUPATION: Nurse  PLOF: Independent  PATIENT GOALS: decreased pain, increased confidence while walking   NEXT MD VISIT: 03/25/24  OBJECTIVE:  Note: Objective measures were completed at Evaluation unless otherwise noted.  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 No evidence of acute fracture or dislocation. No focal bone lesion or bone destruction. No significant effusions. Soft tissues are unremarkable.  PATIENT SURVEYS:  LEFS: 27/80 33.75%  05/18/24:  46 / 80 = 57.5 %  COGNITION: Overall cognitive status: Within functional limits for tasks assessed     SENSATION: WFL  POSTURE: No Significant postural limitations  PALPATION: Tenderness at surgical site.   LOWER EXTREMITY ROM:  Passive ROM Right eval Left eval Right  Right 12/9 Right  12/23 Right  1/13  Hip flexion        Hip extension  Hip abduction        Hip adduction        Hip internal rotation        Hip external rotation        Knee flexion 78 degrees  0 106 118 120 123  Knee extension   -3 -3 -2 -3  Ankle dorsiflexion        Ankle plantarflexion        Ankle inversion        Ankle eversion         (Blank rows = not tested)  LOWER EXTREMITY MMT:  MMT Right 12/23 Left eval   Hip flexion 4-    Hip extension 3+    Hip abduction     Hip adduction     Hip internal rotation     Hip external rotation     Knee flexion     Knee extension     Ankle  dorsiflexion     Ankle plantarflexion     Ankle inversion     Ankle eversion      (Blank rows = not tested)  LOWER EXTREMITY SPECIAL TESTS:    FUNCTIONAL TESTS:   GAIT: 1/13 mild antalgic gait                                                                                                                                TREATMENT DATE: 1/13 Manual: Patella mobs  STM to medial quad and adductors Ktpae for pes anserine inhibition PROM R knee  There-ex SLR 3x12  Single HR 3x10R Standing hip abduction RTB 3x30ea Squats 2x10  There-act Step ups 3x10 4 Lateral Step ups 3x10 4 Supine SL bridge 3x10  Introduced hamstring stretch  1/7 Scifit bike x16min L3 Patella mobs  STM to medial quad and adductors Ktpae for pes anserine inhibition PROM R knee Single HR 2x10R Standing hip abduction RTB 3x30ea Squats 2x10 Wall squats 2x10 Supine SL bridge 3x10  12/30 Scifit bike x82min L3 Patella mobs  Roller to R adductor mm PROM R knee SLR 3x10 Supine SL bridge 3x10 S/l hip abduction 2x15 LAQ 4# 3 hold 3x10 HSC EOB GTB 3x10 Single HR 2x10R Step ups 6 2x10 with cues for glute max engagement  DL 89oa KB 7k89 (at wall) Goblet squat taps to plinth 10lb KB 2x10     05/18/24 Scifit bike x57min L3 Patella mobs and OP into extension Seated HS stretch  Partial squats 2x10 with GTB  Prone HS curls with YTB resistance Supine SL bridges Supine HS roll in/out with foam roller   05/15/24 Contract relax with OP into extension Attempted leg extension with 5lbs with pt reporting pain Shuttle with 75lbs, dbl leg press  1 lap around clinic with focus on HS stretch and heel to toe gait pattern HS/ Adductor stretch in supine LAQ 4# 5 2x10 Heel raises 1x8 Partial squats 2x10 Partial lunges x15ea, with PT pulling GTB into  abduction for increased abductor and glute engagement    12/18 PROM R knee Long sit HSS 30sec x3 SLR 1x10 SLR on elbows 2x10 S/l hip abduction  2x15 Bridges 3x10 LAQ 3# 5 2x10 Scifit bike x66min L3 Step ups 4 x10, 6 2x10 R Heel raises 2x15 Partial squats 2x10 Partial lunges x10ea  12/9 PROM R knee 118 deg active flexion 3deg lacking extension Heel prop with manual overpressure SLR 2x10 S/l hip abduction 2x15 Bridges 2x10 LAQ 3# 5 2x10 STS  2x10 Scifit bike x14min L3 Step ups 4 x5, 6 2x10 R  12/2 PROM R knee Heel prop with manual overpressure Quad sets 5 x15 SLR 2x10 S/l hip abduction 2x10 Bridges 2x10 Prone TKE 5 2x10 LAQ 5 2x10 STS  2x10 Standing HR 3x10 Scifit bike x33min L3 Step ups 4 2x10 fwd and lateral   PATIENT EDUCATION:  Education details: Educated pt on anatomy and physiology of current symptoms, LEFS, diagnosis, prognosis, HEP,  and POC. Person educated: Patient Education method: Explanation, Demonstration, Verbal cues, and Handouts Education comprehension: verbalized understanding and returned demonstration  HOME EXERCISE PROGRAM: Access Code: [email or web address] URL: [email or web address] Date: 03/15/2024 Prepared by: Rojean Batten  Exercises - Supine Quad Set  - 1-2 x daily - 7 x weekly - 2-3 sets - 10 reps - Supine Active Straight Leg Raise  - 1-2 x daily - 7 x weekly - 2-3 sets - 10 reps - Supine Heel Slide with Strap  - 1-2 x daily - 7 x weekly - 2-3 sets - 10 reps  ASSESSMENT:  CLINICAL IMPRESSION:  Pt with limited superior/inferior patella mobility and ongoing tenderness to medial thigh musculature. Pt felt improvement with patella mobilizations. Instructed pt in self patella mobilization to perform at home. Pt. Tolerated exercises well. When PT introduced step downs, the 6 box reproduced the sharp pain. PT switched the 4 box in and the pt. Tolerated that well. Pt. Ed given regarding hamstring stretch. Pt is making progress She had a recent set back. She is back to the gym. She is having minor pain at this time. She would benefit from further skilled therapy to return to  full function and return to gym.    Eval: Patient is a 54 y.o. F who was seen today for physical therapy evaluation and treatment for S/P R meniscal repair . No signs of infection or DVT's today. Surgical site looks clean. Applied InteguDerm and guaze over stitches. Provided pt with extra's. Pt tolerated session well today. She was limited by pain and had a few lower back spasms. Patient will benefit from skilled PT to address below impairments, limitations and improve overall function.   ACTIVITY LIMITATIONS: bending, lifting, carry, locomotion, cleaning, community activity, driving, and or occupation  PERSONAL FACTORS: Asthma, Hypertension, Urticaria. Hx of ongoing knee pain are also affecting patient's functional outcome.  REHAB POTENTIAL: Good  CLINICAL DECISION MAKING: Stable/uncomplicated  EVALUATION COMPLEXITY: Low    GOALS: Short term PT Goals Target date: 03/29/24 Pt will be I and compliant with HEP. Baseline:  Goal status: met 1/13 Pt will decrease pain by 25% overall Baseline: Goal status: MET 12/18     3. Patient will have full pain free extension   Baseline : Met 1/13   Long term PT goals Target date: POC date  Pt will improve ROM to [facility] to improve functional mobility Baseline: Goal status: Met 05/18/24 Pt will improve  hip/knee strength to at least 5-/5 MMT to improve functional strength Baseline: Goal status: PROGRESSING 12/23 Pt  will improve LEFS by 9 points to show improved function Baseline:27 Goal status: Met 05/18/24 Pt will reduce pain by overall 50% overall with usual activity Baseline: Goal status: ONGOING 06/08/2024 Pt will reduce pain to overall less than 2-3/10 with usual activity and work activity. Baseline: Goal status: MET 05/18/24 Pt will be able to ambulate community distances at least 1000 ft WNL gait pattern without complaints Baseline: Goal status: MET 05/18/24 7. Patient will go up/down 8 steps with reciprocol gait pattern without  pain  Baseline: new   PLAN: PT FREQUENCY: 1-2 times per week   PT DURATION: 6-8 weeks   PLANNED INTERVENTIONS (unless contraindicated): aquatic PT, Canalith repositioning, cryotherapy, Electrical stimulation, Iontophoresis with 4 mg/ml dexamethasome, Moist heat, traction, Ultrasound, gait training, Therapeutic exercise, balance training, neuromuscular re-education, patient/family education, prosthetic training, manual techniques, passive ROM, dry needling, taping, vasopnuematic device, vestibular, spinal manipulations, joint manipulations  PLAN FOR NEXT SESSION: Progress per protocol. Increase posterior chain recruitment.      Alm JINNY Don, PT 06/09/2024, 4:07 PM  "

## 2024-06-09 ENCOUNTER — Ambulatory Visit: Payer: 59 | Admitting: Allergy

## 2024-06-09 ENCOUNTER — Encounter (HOSPITAL_BASED_OUTPATIENT_CLINIC_OR_DEPARTMENT_OTHER): Payer: Self-pay | Admitting: Physical Therapy

## 2024-06-10 ENCOUNTER — Ambulatory Visit (HOSPITAL_BASED_OUTPATIENT_CLINIC_OR_DEPARTMENT_OTHER): Admitting: Orthopaedic Surgery

## 2024-06-10 DIAGNOSIS — S83241A Other tear of medial meniscus, current injury, right knee, initial encounter: Secondary | ICD-10-CM

## 2024-06-10 NOTE — Progress Notes (Signed)
 "                                Post Operative Evaluation    Procedure/Date of Surgery: Right knee medial meniscus repair 10/16  Interval History:   Presents 3 months status post medial meniscal repair.  She is continuing to improve.  She does occasionally get a sharp pain about the medial aspect of the knee which is continuing to improve weekly  PMH/PSH/Family History/Social History/Meds/Allergies:    Past Medical History:  Diagnosis Date   Asthma    Hypertension    Urticaria    Past Surgical History:  Procedure Laterality Date   KNEE SURGERY Right 03/11/2024   meniscus repair   Social History   Socioeconomic History   Marital status: Married    Spouse name: Not on file   Number of children: Not on file   Years of education: Not on file   Highest education level: Not on file  Occupational History   Not on file  Tobacco Use   Smoking status: Never   Smokeless tobacco: Never  Vaping Use   Vaping status: Never Used  Substance and Sexual Activity   Alcohol use: Not Currently   Drug use: Never   Sexual activity: Not on file  Other Topics Concern   Not on file  Social History Narrative   Not on file   Social Drivers of Health   Tobacco Use: Low Risk (06/09/2024)   Patient History    Smoking Tobacco Use: Never    Smokeless Tobacco Use: Never    Passive Exposure: Not on file  Financial Resource Strain: Not on file  Food Insecurity: Not on file  Transportation Needs: Not on file  Physical Activity: Not on file  Stress: Not on file  Social Connections: Not on file  Depression (EYV7-0): Not on file  Alcohol Screen: Not on file  Housing: Not on file  Utilities: Not on file  Health Literacy: Not on file   Family History  Problem Relation Age of Onset   Allergic rhinitis Father    Asthma Father    Asthma Brother    Allergies  Allergen Reactions   Doxycycline Other (See Comments)   Iron Other (See Comments)   Minocycline Other (See Comments)    Current Outpatient Medications  Medication Sig Dispense Refill   aspirin  EC 325 MG tablet Take 1 tablet (325 mg total) by mouth daily. (Patient not taking: Reported on 04/07/2024) 14 tablet 0   Carbinoxamine  Maleate 4 MG TABS TAKE ONE AND ONE-HALF TABLETS BY MOUTH TWICE DAILY AS NEEDED for allergies 270 tablet 1   Cholecalciferol (VITAMIN D) 125 MCG (5000 UT) CAPS Take 1 capsule by mouth daily at 12 noon.     ciclopirox (PENLAC) 8 % solution Apply topically. (Patient not taking: Reported on 04/07/2024)     EPINEPHrine  0.3 mg/0.3 mL IJ SOAJ injection Inject 0.3 mg into the muscle as needed for anaphylaxis. 2 each 1   Fluticasone  Furoate (ARNUITY ELLIPTA ) 100 MCG/ACT AEPB Inhale 1 puff into the lungs daily. Rinse mouth after each use. (Patient not taking: Reported on 04/07/2024) 30 each 5   levalbuterol  (XOPENEX  HFA) 45 MCG/ACT inhaler Inhale 2 puffs into the lungs every 4 (four) hours as needed for wheezing or shortness of breath (coughing). 15 g 1   montelukast  (SINGULAIR ) 10 MG tablet Take 1 tablet (10 mg total) by mouth at bedtime. 30 tablet 5  naproxen  (NAPROSYN ) 500 MG tablet Take 1 tablet (500 mg total) by mouth 2 (two) times daily. (Patient not taking: Reported on 04/07/2024) 30 tablet 0   olmesartan (BENICAR) 40 MG tablet Take 40 mg by mouth daily.     Olopatadine  HCl 0.6 % SOLN Place 1-2 sprays into both nostrils 2 (two) times daily as needed (drainage). (Patient not taking: Reported on 04/07/2024) 30.5 g 5   Olopatadine -Mometasone ([name] ) 665-25 MCG/ACT SUSP Place 1-2 sprays into the nose in the morning and at bedtime. 29 g 5   oxyCODONE  (ROXICODONE ) 5 MG immediate release tablet Take 1 tablet (5 mg total) by mouth every 4 (four) hours as needed for severe pain (pain score 7-10) or breakthrough pain. (Patient not taking: Reported on 04/07/2024) 10 tablet 0   rosuvastatin (CRESTOR) 5 MG tablet Take 5 mg by mouth daily.     No current facility-administered medications for this visit.    No results found.  Review of Systems:   A ROS was performed including pertinent positives and negatives as documented in the HPI.   Musculoskeletal Exam:    There were no vitals taken for this visit.  Right knee incisions are well-appearing without erythema or drainage.  Distal neurosensory exam is intact range of motion is from 0 to 90 degrees with trace effusion  Imaging:      I personally reviewed and interpreted the radiographs.   Assessment:   12 weeks status post right knee medial meniscal repair overall doing well.  At this time she is continuing to improve nicely.  She does occasionally get sharp pain which is continuing to improve.  At this time I will plan to see her back once more in 2 months for likely final check Plan :    - Return to clinic 6 weeks for reassessment      I personally saw and evaluated the patient, and participated in the management and treatment plan.  Elspeth Parker, MD Attending Physician, Orthopedic Surgery  This document was dictated using Dragon voice recognition software. A reasonable attempt at proof reading has been made to minimize errors. "

## 2024-06-16 ENCOUNTER — Encounter (HOSPITAL_BASED_OUTPATIENT_CLINIC_OR_DEPARTMENT_OTHER): Payer: Self-pay

## 2024-06-16 ENCOUNTER — Ambulatory Visit (HOSPITAL_BASED_OUTPATIENT_CLINIC_OR_DEPARTMENT_OTHER): Payer: Self-pay

## 2024-06-16 DIAGNOSIS — M25561 Pain in right knee: Secondary | ICD-10-CM

## 2024-06-16 DIAGNOSIS — R2689 Other abnormalities of gait and mobility: Secondary | ICD-10-CM

## 2024-06-16 DIAGNOSIS — M6281 Muscle weakness (generalized): Secondary | ICD-10-CM

## 2024-06-16 NOTE — Therapy (Signed)
 " OUTPATIENT PHYSICAL THERAPY LOWER EXTREMITY TREATMENT/      Patient Name: Cindy [name] MRN: [id number] DOB:[birth year], 54 y.o., female Today's Date: 06/16/2024  END OF SESSION:  PT End of Session - 06/16/24 1436     Visit Number 17    Number of Visits 24    Date for Recertification  08/04/24    Authorization Type [employer]    Progress Note Due on Visit 10    PT Start Time 1432    PT Stop Time 1515    PT Time Calculation (min) 43 min    Activity Tolerance Patient tolerated treatment well    Behavior During Therapy WFL for tasks assessed/performed                Past Medical History:  Diagnosis Date   Asthma    Hypertension    Urticaria    Past Surgical History:  Procedure Laterality Date   KNEE SURGERY Right 03/11/2024   meniscus repair   Patient Active Problem List   Diagnosis Date Noted   Seasonal and perennial allergic rhinitis 06/30/2019   Reactive airway disease 06/30/2019    PCP: Espinoza, Alejandra, DO  REFERRING PROVIDER: Genelle Standing, MD  REFERRING DIAG:  Acute medial meniscus tear of right knee, initial encounter [D16.758J]   THERAPY DIAG:  Muscle weakness (generalized)  Acute pain of right knee  Other abnormalities of gait and mobility  Rationale for Evaluation and Treatment: Rehabilitation  ONSET DATE: 03/11/24  SUBJECTIVE:   SUBJECTIVE STATEMENT:  Pt reports the sharp pain is still coming and going. Felt pretty good yesterday.   Pt has a long history of  knee pain. She has had PT in the past, which did not resolve the pain. She has extensive damage in her knee per her most recent MRI. She received a meniscal repair on 03/11/24. 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   PERTINENT HISTORY: Asthma Hypertension Urticaria PAIN:  Are you having pain? Yes: NPRS scale: 3/10 with WB.  Pain location: R knee at inferior patella.  Pain description: Dull ache, pulling when standing.  Aggravating factors:  Standing, walking.  Relieving factors: Sitting, resting, medicine.   PRECAUTIONS: Knee, meniscal repair   RED FLAGS: None   WEIGHT BEARING RESTRICTIONS: Yes WBAT with AD  FALLS:  Has patient fallen in last 6 months? No  LIVING ENVIRONMENT: 11-12 stairs  OCCUPATION: Nurse  PLOF: Independent  PATIENT GOALS: decreased pain, increased confidence while walking   NEXT MD VISIT: 03/25/24  OBJECTIVE:  Note: Objective measures were completed at Evaluation unless otherwise noted.  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 No evidence of acute fracture or dislocation. No focal bone lesion or bone destruction. No significant effusions. Soft tissues are unremarkable.  PATIENT SURVEYS:  LEFS: 27/80 33.75%  05/18/24:  46 / 80 = 57.5 %  COGNITION: Overall cognitive status: Within functional limits for tasks assessed     SENSATION: WFL  POSTURE: No Significant postural limitations  PALPATION: Tenderness at surgical site.   LOWER EXTREMITY ROM:  Passive ROM Right eval Left eval Right  Right 12/9 Right  12/23 Right  1/13  Hip flexion        Hip extension        Hip abduction        Hip adduction        Hip internal rotation  Hip external rotation        Knee flexion 78 degrees  0 106 118 120 123  Knee extension   -3 -3 -2 -3  Ankle dorsiflexion        Ankle plantarflexion        Ankle inversion        Ankle eversion         (Blank rows = not tested)  LOWER EXTREMITY MMT:  MMT Right 12/23 Left eval   Hip flexion 4-    Hip extension 3+    Hip abduction     Hip adduction     Hip internal rotation     Hip external rotation     Knee flexion     Knee extension     Ankle dorsiflexion     Ankle plantarflexion     Ankle inversion     Ankle eversion      (Blank rows = not tested)  LOWER EXTREMITY SPECIAL TESTS:     FUNCTIONAL TESTS:   GAIT: 1/13 mild antalgic gait                                                                                                                                TREATMENT DATE:  06/16/24 Scifit bike x59min L3 STM to medial quad and adductors Ktpae for pes anserine inhibition PROM R knee Single HR 2x15R Standing hip abduction RTB 3x30ea Wall squats 2x10 3sec hold Supine SL bridge 3x10 Step ups 6 2x10 Deadlift 10lb 2x10 HS curl with red PB (attempted, but too challenging, switch to HS bridge with ball 2x10) Long sit HSS 3x 30sec R  1/13 Manual: Patella mobs  STM to medial quad and adductors Ktpae for pes anserine inhibition PROM R knee  There-ex SLR 3x12  Single HR 3x10R Standing hip abduction RTB 3x30ea Squats 2x10  There-act Step ups 3x10 4 Lateral Step ups 3x10 4 Supine SL bridge 3x10  Introduced hamstring stretch  1/7 Scifit bike x24min L3 Patella mobs  STM to medial quad and adductors Ktpae for pes anserine inhibition PROM R knee Single HR 2x10R Standing hip abduction RTB 3x30ea Squats 2x10 Wall squats 2x10 Supine SL bridge 3x10  12/30 Scifit bike x27min L3 Patella mobs  Roller to R adductor mm PROM R knee SLR 3x10 Supine SL bridge 3x10 S/l hip abduction 2x15 LAQ 4# 3 hold 3x10 HSC EOB GTB 3x10 Single HR 2x10R Step ups 6 2x10 with cues for glute max engagement  DL 89oa KB 7k89 (at wall) Goblet squat taps to plinth 10lb KB 2x10     05/18/24 Scifit bike x51min L3 Patella mobs and OP into extension Seated HS stretch  Partial squats 2x10 with GTB  Prone HS curls with YTB resistance Supine SL bridges Supine HS roll in/out with foam roller   05/15/24 Contract relax with OP into extension Attempted leg extension with 5lbs with pt reporting pain Shuttle with 75lbs, dbl leg press  1 lap around clinic with focus on HS stretch and heel to toe gait pattern HS/ Adductor stretch in supine LAQ 4# 5 2x10 Heel raises  1x8 Partial squats 2x10 Partial lunges x15ea, with PT pulling GTB into abduction for increased abductor and glute engagement  CATION:  Education details: Educated pt on anatomy and physiology of current symptoms, LEFS, diagnosis, prognosis, HEP,  and POC. Person educated: Patient Education method: Explanation, Demonstration, Verbal cues, and Handouts Education comprehension: verbalized understanding and returned demonstration  HOME EXERCISE PROGRAM: Access Code: [email or web address] URL: [email or web address] Date: 03/15/2024 Prepared by: Rojean Batten  Exercises - Supine Quad Set  - 1-2 x daily - 7 x weekly - 2-3 sets - 10 reps - Supine Active Straight Leg Raise  - 1-2 x daily - 7 x weekly - 2-3 sets - 10 reps - Supine Heel Slide with Strap  - 1-2 x daily - 7 x weekly - 2-3 sets - 10 reps  ASSESSMENT:  CLINICAL IMPRESSION:  Progressed posterior chain strengthening today with goo tolerance overall. Trialed HS curls with physioball under knees, though this was too difficult. She was able to modify to a HS bridge with improved performance. Continues to fatigue in glute med mm bilaterally with exercise. Performed loaded deadlift without c/o pain. ROM nearly equal to contralateral knee with some tightness at end range flexion. Will continue to progress strengthening as tolerated.    Eval: Patient is a 54 y.o. F who was seen today for physical therapy evaluation and treatment for S/P R meniscal repair . No signs of infection or DVT's today. Surgical site looks clean. Applied InteguDerm and guaze over stitches. Provided pt with extra's. Pt tolerated session well today. She was limited by pain and had a few lower back spasms. Patient will benefit from skilled PT to address below impairments, limitations and improve overall function.   ACTIVITY LIMITATIONS: bending, lifting, carry, locomotion, cleaning, community activity, driving, and or occupation  PERSONAL FACTORS: Asthma, Hypertension,  Urticaria. Hx of ongoing knee pain are also affecting patient's functional outcome.  REHAB POTENTIAL: Good  CLINICAL DECISION MAKING: Stable/uncomplicated  EVALUATION COMPLEXITY: Low    GOALS: Short term PT Goals Target date: 03/29/24 Pt will be I and compliant with HEP. Baseline:  Goal status: met 1/13 Pt will decrease pain by 25% overall Baseline: Goal status: MET 12/18     3. Patient will have full pain free extension   Baseline : Met 1/13   Long term PT goals Target date: POC date  Pt will improve ROM to [facility] to improve functional mobility Baseline: Goal status: Met 05/18/24 Pt will improve  hip/knee strength to at least 5-/5 MMT to improve functional strength Baseline: Goal status: PROGRESSING 12/23 Pt will improve LEFS by 9 points to show improved function Baseline:27 Goal status: Met 05/18/24 Pt will reduce pain by overall 50% overall with usual activity Baseline: Goal status: ONGOING 06/08/2024 Pt will reduce pain to overall less than 2-3/10 with usual activity and work activity. Baseline: Goal status: MET 05/18/24 Pt will be able to ambulate community distances at least 1000 ft WNL gait pattern without complaints Baseline: Goal status: MET 05/18/24 7. Patient will go up/down 8 steps with reciprocol gait pattern without pain  Baseline: new   PLAN: PT FREQUENCY: 1-2 times per week   PT DURATION: 6-8 weeks   PLANNED INTERVENTIONS (unless contraindicated): aquatic PT, Canalith repositioning, cryotherapy, Electrical stimulation, Iontophoresis with 4 mg/ml dexamethasome, Moist heat, traction, Ultrasound, gait training, Therapeutic exercise, balance training, neuromuscular re-education, patient/family  education, prosthetic training, manual techniques, passive ROM, dry needling, taping, vasopnuematic device, vestibular, spinal manipulations, joint manipulations  PLAN FOR NEXT SESSION: Progress per protocol. Increase posterior chain recruitment.      Cindy [name] BRAVO  Fateh Kindle, PTA 06/16/2024, 4:58 PM  "

## 2024-06-21 ENCOUNTER — Ambulatory Visit (HOSPITAL_BASED_OUTPATIENT_CLINIC_OR_DEPARTMENT_OTHER): Payer: Self-pay

## 2024-06-23 NOTE — Progress Notes (Signed)
 VIALS MADE ON 06/23/24

## 2024-07-01 ENCOUNTER — Ambulatory Visit (HOSPITAL_BASED_OUTPATIENT_CLINIC_OR_DEPARTMENT_OTHER): Payer: Self-pay | Attending: Sports Medicine

## 2024-07-01 ENCOUNTER — Ambulatory Visit

## 2024-07-01 ENCOUNTER — Encounter (HOSPITAL_BASED_OUTPATIENT_CLINIC_OR_DEPARTMENT_OTHER): Payer: Self-pay

## 2024-07-01 DIAGNOSIS — J302 Other seasonal allergic rhinitis: Secondary | ICD-10-CM

## 2024-07-01 DIAGNOSIS — R2689 Other abnormalities of gait and mobility: Secondary | ICD-10-CM

## 2024-07-01 DIAGNOSIS — M25561 Pain in right knee: Secondary | ICD-10-CM

## 2024-07-01 DIAGNOSIS — M6281 Muscle weakness (generalized): Secondary | ICD-10-CM

## 2024-07-01 NOTE — Therapy (Signed)
 " OUTPATIENT PHYSICAL THERAPY LOWER EXTREMITY TREATMENT/      Patient Name: Cindy [name] MRN: [id number] DOB:Apr 18, 1971, 54 y.o., female Today's Date: 07/01/2024  END OF SESSION:  PT End of Session - 07/01/24 1010     Visit Number 18    Number of Visits 24    Date for Recertification  08/04/24    Authorization Type [employer]    PT Start Time 1018    PT Stop Time 1100    PT Time Calculation (min) 42 min    Activity Tolerance Patient tolerated treatment well    Behavior During Therapy WFL for tasks assessed/performed                 Past Medical History:  Diagnosis Date   Asthma    Hypertension    Urticaria    Past Surgical History:  Procedure Laterality Date   KNEE SURGERY Right 03/11/2024   meniscus repair   Patient Active Problem List   Diagnosis Date Noted   Seasonal and perennial allergic rhinitis 06/30/2019   Reactive airway disease 06/30/2019    PCP: Espinoza, Alejandra, DO  REFERRING PROVIDER: Genelle Standing, MD  REFERRING DIAG:  Acute medial meniscus tear of right knee, initial encounter [D16.758J]   THERAPY DIAG:  Muscle weakness (generalized)  Acute pain of right knee  Other abnormalities of gait and mobility  Rationale for Evaluation and Treatment: Rehabilitation  ONSET DATE: 03/11/24  SUBJECTIVE:   SUBJECTIVE STATEMENT:  Pt reports the sharp pain is still coming and going. Felt pretty good yesterday.   Pt has a long history of  knee pain. She has had PT in the past, which did not resolve the pain. She has extensive damage in her knee per her most recent MRI. She received a meniscal repair on 03/11/24. 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   PERTINENT HISTORY: Asthma Hypertension Urticaria PAIN:  Are you having pain? Yes: NPRS scale: 3/10 with WB.  Pain location: R knee at inferior patella.  Pain description: Dull ache, pulling when standing.  Aggravating factors: Standing, walking.  Relieving factors:  Sitting, resting, medicine.   PRECAUTIONS: Knee, meniscal repair   RED FLAGS: None   WEIGHT BEARING RESTRICTIONS: Yes WBAT with AD  FALLS:  Has patient fallen in last 6 months? No  LIVING ENVIRONMENT: 11-12 stairs  OCCUPATION: Nurse  PLOF: Independent  PATIENT GOALS: decreased pain, increased confidence while walking   NEXT MD VISIT: 03/25/24  OBJECTIVE:  Note: Objective measures were completed at Evaluation unless otherwise noted.  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 No evidence of acute fracture or dislocation. No focal bone lesion or bone destruction. No significant effusions. Soft tissues are unremarkable.  PATIENT SURVEYS:  LEFS: 27/80 33.75%  05/18/24:  46 / 80 = 57.5 %  COGNITION: Overall cognitive status: Within functional limits for tasks assessed     SENSATION: WFL  POSTURE: No Significant postural limitations  PALPATION: Tenderness at surgical site.   LOWER EXTREMITY ROM:  Passive ROM Right eval Left eval Right  Right 12/9 Right  12/23 Right  1/13  Hip flexion        Hip extension        Hip abduction        Hip adduction        Hip internal rotation        Hip external  rotation        Knee flexion 78 degrees  0 106 118 120 123  Knee extension   -3 -3 -2 -3  Ankle dorsiflexion        Ankle plantarflexion        Ankle inversion        Ankle eversion         (Blank rows = not tested)  LOWER EXTREMITY MMT:  MMT Right 12/23 Left eval   Hip flexion 4-    Hip extension 3+    Hip abduction     Hip adduction     Hip internal rotation     Hip external rotation     Knee flexion     Knee extension     Ankle dorsiflexion     Ankle plantarflexion     Ankle inversion     Ankle eversion      (Blank rows = not tested)  LOWER EXTREMITY SPECIAL TESTS:    FUNCTIONAL TESTS:   GAIT: 1/13 mild  antalgic gait                                                                                                                                TREATMENT DATE:  07/01/24 Scifit bike x20min L3 STM to medial quad and adductors PROM R knee Single HR 2x15R SLDL R LE 2x10 Standing marching on airex 2x10 Standing hip abduction RTB 3x10ea Wall squats 2x10 3sec hold Runner step ups 8 2x10 Deadlift 10lb 2x10 HS curl with red PB  2x10 Long sit HSS 3x 30sec R   06/16/24 Scifit bike x61min L3 STM to medial quad and adductors Ktpae for pes anserine inhibition PROM R knee Single HR 2x15R Standing hip abduction RTB 3x30ea Wall squats 2x10 3sec hold Supine SL bridge 3x10 Step ups 6 2x10 Deadlift 10lb 2x10 HS curl with red PB (attempted, but too challenging, switch to HS bridge with ball 2x10) Long sit HSS 3x 30sec R  1/13 Manual: Patella mobs  STM to medial quad and adductors Ktpae for pes anserine inhibition PROM R knee  There-ex SLR 3x12  Single HR 3x10R Standing hip abduction RTB 3x30ea Squats 2x10  There-act Step ups 3x10 4 Lateral Step ups 3x10 4 Supine SL bridge 3x10  Introduced hamstring stretch  1/7 Scifit bike x78min L3 Patella mobs  STM to medial quad and adductors Ktpae for pes anserine inhibition PROM R knee Single HR 2x10R Standing hip abduction RTB 3x30ea Squats 2x10 Wall squats 2x10 Supine SL bridge 3x10  CATION:  Education details: Educated pt on anatomy and physiology of current symptoms, LEFS, diagnosis, prognosis, HEP,  and POC. Person educated: Patient Education method: Explanation, Demonstration, Verbal cues, and Handouts Education comprehension: verbalized understanding and returned demonstration  HOME EXERCISE PROGRAM: Access Code: [email or web address] URL: [email or web address] Date: 03/15/2024 Prepared by: Rojean Batten  Exercises - Supine Quad Set  - 1-2 x daily - 7 x weekly - 2-3 sets -  10 reps - Supine Active Straight Leg Raise  -  1-2 x daily - 7 x weekly - 2-3 sets - 10 reps - Supine Heel Slide with Strap  - 1-2 x daily - 7 x weekly - 2-3 sets - 10 reps  ASSESSMENT:  CLINICAL IMPRESSION:  Continued with focus on posterior chain strengthening. She remains challenged by [facility] with physioball, but improved from last session. Continued to work on STM to medial distal quad and adductors with good tolerance, though tender here. Fatigued quickly with wall sits and step ups on 8 step. Will continue to build strength and work towards independent program.    Eval: Patient is a 54 y.o. F who was seen today for physical therapy evaluation and treatment for S/P R meniscal repair . No signs of infection or DVT's today. Surgical site looks clean. Applied InteguDerm and guaze over stitches. Provided pt with extra's. Pt tolerated session well today. She was limited by pain and had a few lower back spasms. Patient will benefit from skilled PT to address below impairments, limitations and improve overall function.   ACTIVITY LIMITATIONS: bending, lifting, carry, locomotion, cleaning, community activity, driving, and or occupation  PERSONAL FACTORS: Asthma, Hypertension, Urticaria. Hx of ongoing knee pain are also affecting patient's functional outcome.  REHAB POTENTIAL: Good  CLINICAL DECISION MAKING: Stable/uncomplicated  EVALUATION COMPLEXITY: Low    GOALS: Short term PT Goals Target date: 03/29/24 Pt will be I and compliant with HEP. Baseline:  Goal status: met 1/13 Pt will decrease pain by 25% overall Baseline: Goal status: MET 12/18     3. Patient will have full pain free extension   Baseline : Met 1/13   Long term PT goals Target date: POC date  Pt will improve ROM to [facility] to improve functional mobility Baseline: Goal status: Met 05/18/24 Pt will improve  hip/knee strength to at least 5-/5 MMT to improve functional strength Baseline: Goal status: PROGRESSING 12/23 Pt will improve LEFS by 9 points to show improved  function Baseline:27 Goal status: Met 05/18/24 Pt will reduce pain by overall 50% overall with usual activity Baseline: Goal status: ONGOING 06/08/2024 Pt will reduce pain to overall less than 2-3/10 with usual activity and work activity. Baseline: Goal status: MET 05/18/24 Pt will be able to ambulate community distances at least 1000 ft WNL gait pattern without complaints Baseline: Goal status: MET 05/18/24 7. Patient will go up/down 8 steps with reciprocol gait pattern without pain  Baseline: new   PLAN: PT FREQUENCY: 1-2 times per week   PT DURATION: 6-8 weeks   PLANNED INTERVENTIONS (unless contraindicated): aquatic PT, Canalith repositioning, cryotherapy, Electrical stimulation, Iontophoresis with 4 mg/ml dexamethasome, Moist heat, traction, Ultrasound, gait training, Therapeutic exercise, balance training, neuromuscular re-education, patient/family education, prosthetic training, manual techniques, passive ROM, dry needling, taping, vasopnuematic device, vestibular, spinal manipulations, joint manipulations  PLAN FOR NEXT SESSION: Progress per protocol. Increase posterior chain recruitment.      [place] BRAVO Lauri Purdum, PTA 07/01/2024, 11:11 AM  "

## 2024-07-05 ENCOUNTER — Ambulatory Visit (HOSPITAL_BASED_OUTPATIENT_CLINIC_OR_DEPARTMENT_OTHER): Payer: Self-pay | Admitting: Physical Therapy

## 2024-07-12 ENCOUNTER — Encounter (HOSPITAL_BASED_OUTPATIENT_CLINIC_OR_DEPARTMENT_OTHER): Payer: Self-pay

## 2024-08-11 ENCOUNTER — Ambulatory Visit (HOSPITAL_BASED_OUTPATIENT_CLINIC_OR_DEPARTMENT_OTHER): Admitting: Orthopaedic Surgery

## 2024-08-12 ENCOUNTER — Ambulatory Visit (HOSPITAL_BASED_OUTPATIENT_CLINIC_OR_DEPARTMENT_OTHER): Admitting: Orthopaedic Surgery

## 2024-10-06 ENCOUNTER — Ambulatory Visit: Admitting: Allergy
